# Patient Record
Sex: Male | Born: 1975 | Race: Black or African American | Hispanic: No | Marital: Married | Smoking: Former smoker
Health system: Southern US, Community
[De-identification: ages and names within clinical notes are randomized; demographics above are authoritative.]

## PROBLEM LIST (undated history)

## (undated) DIAGNOSIS — F32A Depression, unspecified: Secondary | ICD-10-CM

## (undated) DIAGNOSIS — R519 Headache, unspecified: Secondary | ICD-10-CM

## (undated) DIAGNOSIS — D72819 Decreased white blood cell count, unspecified: Secondary | ICD-10-CM

## (undated) DIAGNOSIS — IMO0001 Reserved for inherently not codable concepts without codable children: Secondary | ICD-10-CM

## (undated) DIAGNOSIS — Z5189 Encounter for other specified aftercare: Secondary | ICD-10-CM

## (undated) DIAGNOSIS — I10 Essential (primary) hypertension: Secondary | ICD-10-CM

## (undated) DIAGNOSIS — R58 Hemorrhage, not elsewhere classified: Secondary | ICD-10-CM

## (undated) DIAGNOSIS — D649 Anemia, unspecified: Secondary | ICD-10-CM

## (undated) DIAGNOSIS — E669 Obesity, unspecified: Secondary | ICD-10-CM

## (undated) DIAGNOSIS — K859 Acute pancreatitis without necrosis or infection, unspecified: Secondary | ICD-10-CM

## (undated) DIAGNOSIS — Z9181 History of falling: Secondary | ICD-10-CM

## (undated) DIAGNOSIS — E785 Hyperlipidemia, unspecified: Secondary | ICD-10-CM

## (undated) DIAGNOSIS — M1990 Unspecified osteoarthritis, unspecified site: Secondary | ICD-10-CM

## (undated) DIAGNOSIS — S88119A Complete traumatic amputation at level between knee and ankle, unspecified lower leg, initial encounter: Secondary | ICD-10-CM

## (undated) DIAGNOSIS — R531 Weakness: Secondary | ICD-10-CM

## (undated) DIAGNOSIS — F419 Anxiety disorder, unspecified: Secondary | ICD-10-CM

## (undated) DIAGNOSIS — F1020 Alcohol dependence, uncomplicated: Secondary | ICD-10-CM

## (undated) HISTORY — DX: Obesity, unspecified: E66.9

## (undated) HISTORY — DX: History of falling: Z91.81

## (undated) HISTORY — DX: Complete traumatic amputation at level between knee and ankle, unspecified lower leg, initial encounter: S88.119A

## (undated) HISTORY — DX: Hyperlipidemia, unspecified: E78.5

## (undated) HISTORY — DX: Decreased white blood cell count, unspecified: D72.819

## (undated) HISTORY — DX: Anxiety disorder, unspecified: F41.9

## (undated) HISTORY — DX: Alcohol dependence, uncomplicated: F10.20

## (undated) HISTORY — DX: Weakness: R53.1

## (undated) HISTORY — PX: EXPLORATORY LAPAROTOMY: SUR591

---

## 2010-07-31 ENCOUNTER — Emergency Department (HOSPITAL_COMMUNITY): Admission: EM | Admit: 2010-07-31 | Discharge: 2010-07-31 | Payer: Self-pay | Admitting: Family Medicine

## 2010-08-14 ENCOUNTER — Ambulatory Visit: Payer: Self-pay | Admitting: Internal Medicine

## 2010-08-14 ENCOUNTER — Inpatient Hospital Stay (HOSPITAL_COMMUNITY): Admission: EM | Admit: 2010-08-14 | Discharge: 2010-08-16 | Payer: Self-pay | Admitting: Emergency Medicine

## 2010-08-16 ENCOUNTER — Encounter: Payer: Self-pay | Admitting: Internal Medicine

## 2010-08-16 DIAGNOSIS — K859 Acute pancreatitis without necrosis or infection, unspecified: Secondary | ICD-10-CM

## 2010-08-16 DIAGNOSIS — E11621 Type 2 diabetes mellitus with foot ulcer: Secondary | ICD-10-CM

## 2010-08-16 DIAGNOSIS — E1110 Type 2 diabetes mellitus with ketoacidosis without coma: Secondary | ICD-10-CM

## 2010-08-16 DIAGNOSIS — L97509 Non-pressure chronic ulcer of other part of unspecified foot with unspecified severity: Secondary | ICD-10-CM

## 2010-08-16 DIAGNOSIS — E1142 Type 2 diabetes mellitus with diabetic polyneuropathy: Secondary | ICD-10-CM | POA: Insufficient documentation

## 2010-10-04 ENCOUNTER — Telehealth (INDEPENDENT_AMBULATORY_CARE_PROVIDER_SITE_OTHER): Payer: Self-pay | Admitting: *Deleted

## 2010-10-05 ENCOUNTER — Telehealth (INDEPENDENT_AMBULATORY_CARE_PROVIDER_SITE_OTHER): Payer: Self-pay | Admitting: *Deleted

## 2010-12-22 ENCOUNTER — Emergency Department (HOSPITAL_COMMUNITY)
Admission: EM | Admit: 2010-12-22 | Discharge: 2010-12-22 | Payer: Self-pay | Source: Home / Self Care | Admitting: Emergency Medicine

## 2010-12-22 ENCOUNTER — Telehealth: Payer: Self-pay | Admitting: *Deleted

## 2010-12-22 LAB — GLUCOSE, CAPILLARY: Glucose-Capillary: 376 mg/dL — ABNORMAL HIGH (ref 70–99)

## 2010-12-28 NOTE — Progress Notes (Signed)
Summary: PREVENTIVE COLONSCOPY  Phone Note Outgoing Call   Call placed by: Shon Hough,  October 04, 2010 2:42 PM Summary of Call: Patient is under the age of 63. New patient to the clinic no information found in EMR, and ECHART.  No access to a paper chart. No Colonoscopy info ava. Initial call taken by: Shon Hough,  October 04, 2010 2:43 PM

## 2010-12-28 NOTE — Progress Notes (Signed)
Summary: PREVENTIVE COLONOSCOPY  Phone Note Outgoing Call   Summary of Call: Patient under the age of 51.  New to the clinic no info ava on patient at this time in reagards to a colonoscopy. Initial call taken by: Shon Hough,  October 05, 2010 9:07 AM

## 2010-12-28 NOTE — Discharge Summary (Signed)
Summary: Hospital Discharge Update    Hospital Discharge Update:  Date of Admission: 08/14/2010 Date of Discharge: 08/16/2010  Brief Summary:  Pt was admitted and rx for the following: - DKA: precipitated by medication noncompliance and likely viral gastroenteritis. Admitted w/ BGs to the 500s. Stable after rx w/ insulin drip->subq, volume rescusitation and K repletion. Advised to take his insulin, was given a one month supply and asked to return to the clinic in the event he runs out and cant afford a refill. - Pancreatitis - from alcohol abuse. Not having a flare per no abd pain. Social work was consulted for counseling for his alcohol abuse given his rate of multiple admissions for pancreatitis flares precipitated by alcohol binge. - Financial issues - pt is currently unemployed and so unable to afford his meds. Discussed setting him up with our social worker in the clinic when he follows up for financial counseling.  Other follow-up issues:  Financial counseling/ needs an orange card  Problem list changes:  Added new problem of Hospitalized for  DKA (ICD-250.10) - Signed Added new problem of DIABETES MELLITUS, TYPE II (ICD-250.00) - Signed Added new problem of PANCREATITIS (ICD-577.0) - Signed  Medication list changes:  Added new medication of NOVOLOG MIX 70/30 70-30 % SUSP (INSULIN ASPART PROT & ASPART) inject 20units subq two times a day with meals - Signed Added new medication of NOVOLOG 100 UNIT/ML SOLN (INSULIN ASPART) inject 9units subq before breakfast, 12units before lunch and 9units before dinner - Signed  The medication, problem, and allergy lists have been updated.  Please see the dictated discharge summary for details.  Discharge medications:  NOVOLOG MIX 70/30 70-30 % SUSP (INSULIN ASPART PROT & ASPART) inject 20units subq two times a day with meals NOVOLOG 100 UNIT/ML SOLN (INSULIN ASPART) inject 9units subq before breakfast, 12units before lunch and 9units before  dinner  Other patient instructions:  Call the clinic if you run out of insulin.

## 2010-12-30 NOTE — Progress Notes (Signed)
Summary: WALKIN/ hla  Phone Note Other Incoming   Summary of Call: pt presented wanting to be seen and to get more insulin, pt has never been seen in clinic, appt was scheduled for 10/05/2010 and he was a noshow. spoke w/ dr Reche Dixon, no room on schedule, pt sent to ED and given info sheet from debrorah h., encouraged to be seen in ED and to come back to see [facility] for financial assist. pt states he is agreeable. Initial call taken by: Marin Roberts RN,  December 22, 2010 10:26 AM

## 2011-01-20 ENCOUNTER — Encounter: Payer: Self-pay | Admitting: Internal Medicine

## 2011-01-25 ENCOUNTER — Ambulatory Visit: Payer: Self-pay | Admitting: Internal Medicine

## 2011-02-10 LAB — COMPREHENSIVE METABOLIC PANEL
ALT: 25 U/L (ref 0–53)
Albumin: 2.7 g/dL — ABNORMAL LOW (ref 3.5–5.2)
Alkaline Phosphatase: 101 U/L (ref 39–117)
Alkaline Phosphatase: 147 U/L — ABNORMAL HIGH (ref 39–117)
BUN: 14 mg/dL (ref 6–23)
BUN: 6 mg/dL (ref 6–23)
Calcium: 9.4 mg/dL (ref 8.4–10.5)
Chloride: 108 mEq/L (ref 96–112)
Creatinine, Ser: 1.45 mg/dL (ref 0.4–1.5)
Glucose, Bld: 285 mg/dL — ABNORMAL HIGH (ref 70–99)
Glucose, Bld: 534 mg/dL — ABNORMAL HIGH (ref 70–99)
Potassium: 3.6 mEq/L (ref 3.5–5.1)
Sodium: 134 mEq/L — ABNORMAL LOW (ref 135–145)
Total Bilirubin: 0.8 mg/dL (ref 0.3–1.2)
Total Protein: 9.1 g/dL — ABNORMAL HIGH (ref 6.0–8.3)

## 2011-02-10 LAB — POCT I-STAT 3, VENOUS BLOOD GAS (G3P V)
Patient temperature: 97.4
pCO2, Ven: 33.6 mmHg — ABNORMAL LOW (ref 45.0–50.0)
pH, Ven: 7.019 — CL (ref 7.250–7.300)

## 2011-02-10 LAB — GLUCOSE, CAPILLARY
Glucose-Capillary: 123 mg/dL — ABNORMAL HIGH (ref 70–99)
Glucose-Capillary: 135 mg/dL — ABNORMAL HIGH (ref 70–99)
Glucose-Capillary: 192 mg/dL — ABNORMAL HIGH (ref 70–99)
Glucose-Capillary: 198 mg/dL — ABNORMAL HIGH (ref 70–99)
Glucose-Capillary: 209 mg/dL — ABNORMAL HIGH (ref 70–99)
Glucose-Capillary: 218 mg/dL — ABNORMAL HIGH (ref 70–99)
Glucose-Capillary: 263 mg/dL — ABNORMAL HIGH (ref 70–99)
Glucose-Capillary: 289 mg/dL — ABNORMAL HIGH (ref 70–99)
Glucose-Capillary: 373 mg/dL — ABNORMAL HIGH (ref 70–99)
Glucose-Capillary: 438 mg/dL — ABNORMAL HIGH (ref 70–99)
Glucose-Capillary: 564 mg/dL (ref 70–99)
Glucose-Capillary: 361 mg/dL — ABNORMAL HIGH (ref 70–99)

## 2011-02-10 LAB — BASIC METABOLIC PANEL
BUN: 10 mg/dL (ref 6–23)
BUN: 11 mg/dL (ref 6–23)
BUN: 12 mg/dL (ref 6–23)
BUN: 13 mg/dL (ref 6–23)
BUN: 5 mg/dL — ABNORMAL LOW (ref 6–23)
CO2: 14 mEq/L — ABNORMAL LOW (ref 19–32)
CO2: 16 mEq/L — ABNORMAL LOW (ref 19–32)
CO2: 16 mEq/L — ABNORMAL LOW (ref 19–32)
CO2: 19 mEq/L (ref 19–32)
CO2: 20 mEq/L (ref 19–32)
CO2: 21 mEq/L (ref 19–32)
CO2: 8 mEq/L — CL (ref 19–32)
Calcium: 9 mg/dL (ref 8.4–10.5)
Calcium: 9.1 mg/dL (ref 8.4–10.5)
Calcium: 9.4 mg/dL (ref 8.4–10.5)
Calcium: 9.6 mg/dL (ref 8.4–10.5)
Chloride: 108 mEq/L (ref 96–112)
Chloride: 108 mEq/L (ref 96–112)
Chloride: 109 mEq/L (ref 96–112)
Chloride: 110 mEq/L (ref 96–112)
Chloride: 111 mEq/L (ref 96–112)
Chloride: 112 mEq/L (ref 96–112)
Creatinine, Ser: 0.55 mg/dL (ref 0.4–1.5)
Creatinine, Ser: 0.65 mg/dL (ref 0.4–1.5)
Creatinine, Ser: 0.77 mg/dL (ref 0.4–1.5)
Creatinine, Ser: 0.93 mg/dL (ref 0.4–1.5)
Creatinine, Ser: 1.04 mg/dL (ref 0.4–1.5)
Creatinine, Ser: 1.12 mg/dL (ref 0.4–1.5)
Creatinine, Ser: 1.61 mg/dL — ABNORMAL HIGH (ref 0.4–1.5)
GFR calc Af Amer: 59 mL/min — ABNORMAL LOW (ref >60)
GFR calc Af Amer: >60 mL/min (ref >60)
GFR calc Af Amer: >60 mL/min (ref >60)
GFR calc Af Amer: >60 mL/min (ref >60)
GFR calc Af Amer: >60 mL/min (ref >60)
GFR calc Af Amer: >60 mL/min (ref >60)
GFR calc Af Amer: >60 mL/min (ref >60)
GFR calc Af Amer: >60 mL/min (ref >60)
GFR calc non Af Amer: 49 mL/min — ABNORMAL LOW (ref >60)
GFR calc non Af Amer: >60 mL/min (ref >60)
GFR calc non Af Amer: >60 mL/min (ref >60)
GFR calc non Af Amer: >60 mL/min (ref >60)
GFR calc non Af Amer: >60 mL/min (ref >60)
GFR calc non Af Amer: >60 mL/min (ref >60)
Glucose, Bld: 119 mg/dL — ABNORMAL HIGH (ref 70–99)
Glucose, Bld: 156 mg/dL — ABNORMAL HIGH (ref 70–99)
Glucose, Bld: 174 mg/dL — ABNORMAL HIGH (ref 70–99)
Glucose, Bld: 217 mg/dL — ABNORMAL HIGH (ref 70–99)
Glucose, Bld: 271 mg/dL — ABNORMAL HIGH (ref 70–99)
Glucose, Bld: 512 mg/dL — ABNORMAL HIGH (ref 70–99)
Potassium: 3.3 mEq/L — ABNORMAL LOW (ref 3.5–5.1)
Potassium: 3.4 mEq/L — ABNORMAL LOW (ref 3.5–5.1)
Potassium: 3.9 mEq/L (ref 3.5–5.1)
Potassium: 3.9 mEq/L (ref 3.5–5.1)
Potassium: 4.2 mEq/L (ref 3.5–5.1)
Potassium: 4.5 mEq/L (ref 3.5–5.1)
Potassium: 5.1 mEq/L (ref 3.5–5.1)
Potassium: 5.8 mEq/L — ABNORMAL HIGH (ref 3.5–5.1)
Sodium: 131 mEq/L — ABNORMAL LOW (ref 135–145)
Sodium: 133 mEq/L — ABNORMAL LOW (ref 135–145)
Sodium: 133 mEq/L — ABNORMAL LOW (ref 135–145)
Sodium: 134 mEq/L — ABNORMAL LOW (ref 135–145)
Sodium: 134 mEq/L — ABNORMAL LOW (ref 135–145)
Sodium: 138 mEq/L (ref 135–145)
Sodium: 139 mEq/L (ref 135–145)
Sodium: 139 mEq/L (ref 135–145)

## 2011-02-10 LAB — TSH: TSH: 1.99 u[IU]/mL (ref 0.350–4.500)

## 2011-02-10 LAB — URINALYSIS, ROUTINE W REFLEX MICROSCOPIC
Nitrite: NEGATIVE
Protein, ur: 100 mg/dL — AB
Specific Gravity, Urine: 1.039 — ABNORMAL HIGH (ref 1.005–1.030)
Urobilinogen, UA: 0.2 mg/dL (ref 0.0–1.0)

## 2011-02-10 LAB — CBC
HCT: 39 % (ref 39.0–52.0)
HCT: 44.8 % (ref 39.0–52.0)
MCV: 94.2 fL (ref 78.0–100.0)
MCV: 96.8 fL (ref 78.0–100.0)
RBC: 4.14 MIL/uL — ABNORMAL LOW (ref 4.22–5.81)
RDW: 13.3 % (ref 11.5–15.5)
WBC: 2.6 10*3/uL — ABNORMAL LOW (ref 4.0–10.5)
WBC: 5.5 10*3/uL (ref 4.0–10.5)

## 2011-02-10 LAB — LIPID PANEL
Cholesterol: 237 mg/dL — ABNORMAL HIGH (ref 0–200)
Triglycerides: 195 mg/dL — ABNORMAL HIGH (ref <150)

## 2011-02-10 LAB — HEMOGLOBIN A1C
Hgb A1c MFr Bld: 15 % — ABNORMAL HIGH (ref <5.7)
Mean Plasma Glucose: 384 mg/dL — ABNORMAL HIGH (ref <117)

## 2011-02-10 LAB — POCT CARDIAC MARKERS
Myoglobin, poc: 64.3 ng/mL (ref 12–200)
Troponin i, poc: <0.05 ng/mL (ref 0.00–0.09)

## 2011-04-04 ENCOUNTER — Inpatient Hospital Stay (HOSPITAL_COMMUNITY)
Admission: EM | Admit: 2011-04-04 | Discharge: 2011-04-13 | DRG: 637 | Disposition: A | Discharge status: Home / Self Care | Payer: Self-pay | Attending: Internal Medicine | Admitting: Internal Medicine

## 2011-04-04 DIAGNOSIS — K859 Acute pancreatitis without necrosis or infection, unspecified: Secondary | ICD-10-CM | POA: Diagnosis present

## 2011-04-04 DIAGNOSIS — E1010 Type 1 diabetes mellitus with ketoacidosis without coma: Principal | ICD-10-CM | POA: Diagnosis present

## 2011-04-04 DIAGNOSIS — K862 Cyst of pancreas: Secondary | ICD-10-CM

## 2011-04-04 DIAGNOSIS — E876 Hypokalemia: Secondary | ICD-10-CM | POA: Diagnosis not present

## 2011-04-04 DIAGNOSIS — D7589 Other specified diseases of blood and blood-forming organs: Secondary | ICD-10-CM | POA: Diagnosis present

## 2011-04-04 DIAGNOSIS — E871 Hypo-osmolality and hyponatremia: Secondary | ICD-10-CM | POA: Diagnosis present

## 2011-04-04 DIAGNOSIS — K863 Pseudocyst of pancreas: Secondary | ICD-10-CM

## 2011-04-04 DIAGNOSIS — K861 Other chronic pancreatitis: Secondary | ICD-10-CM | POA: Diagnosis present

## 2011-04-04 DIAGNOSIS — F1020 Alcohol dependence, uncomplicated: Secondary | ICD-10-CM | POA: Diagnosis present

## 2011-04-04 DIAGNOSIS — Z9119 Patient's noncompliance with other medical treatment and regimen: Secondary | ICD-10-CM

## 2011-04-04 DIAGNOSIS — R7881 Bacteremia: Secondary | ICD-10-CM | POA: Diagnosis not present

## 2011-04-04 DIAGNOSIS — B999 Unspecified infectious disease: Secondary | ICD-10-CM

## 2011-04-04 DIAGNOSIS — Z91199 Patient's noncompliance with other medical treatment and regimen due to unspecified reason: Secondary | ICD-10-CM

## 2011-04-04 DIAGNOSIS — B953 Streptococcus pneumoniae as the cause of diseases classified elsewhere: Secondary | ICD-10-CM | POA: Diagnosis not present

## 2011-04-04 DIAGNOSIS — R197 Diarrhea, unspecified: Secondary | ICD-10-CM | POA: Diagnosis not present

## 2011-04-04 DIAGNOSIS — I10 Essential (primary) hypertension: Secondary | ICD-10-CM | POA: Diagnosis present

## 2011-04-04 DIAGNOSIS — K029 Dental caries, unspecified: Secondary | ICD-10-CM | POA: Diagnosis present

## 2011-04-04 HISTORY — DX: Essential (primary) hypertension: I10

## 2011-04-04 LAB — DIFFERENTIAL
Basophils Absolute: 0 10*3/uL (ref 0.0–0.1)
Eosinophils Relative: 0 % (ref 0–5)
Lymphocytes Relative: 9 % — ABNORMAL LOW (ref 12–46)
Lymphs Abs: 0.6 10*3/uL — ABNORMAL LOW (ref 0.7–4.0)
Monocytes Absolute: 0.8 10*3/uL (ref 0.1–1.0)
Monocytes Relative: 12 % (ref 3–12)
Neutro Abs: 5.4 10*3/uL (ref 1.7–7.7)

## 2011-04-04 LAB — BASIC METABOLIC PANEL
BUN: 11 mg/dL (ref 6–23)
CO2: 9 mEq/L — CL (ref 19–32)
Calcium: 10 mg/dL (ref 8.4–10.5)
GFR calc non Af Amer: >60 mL/min (ref >60)
Glucose, Bld: 316 mg/dL — ABNORMAL HIGH (ref 70–99)
Sodium: 126 mEq/L — ABNORMAL LOW (ref 135–145)

## 2011-04-04 LAB — CBC
HCT: 45.5 % (ref 39.0–52.0)
Hemoglobin: 16.2 g/dL (ref 13.0–17.0)
MCH: 35.8 pg — ABNORMAL HIGH (ref 26.0–34.0)
MCHC: 35.6 g/dL (ref 30.0–36.0)
MCV: 100.7 fL — ABNORMAL HIGH (ref 78.0–100.0)
RDW: 12.2 % (ref 11.5–15.5)

## 2011-04-05 LAB — URINALYSIS, ROUTINE W REFLEX MICROSCOPIC
Bilirubin Urine: NEGATIVE
Ketones, ur: >80 mg/dL — AB
Specific Gravity, Urine: 1.034 — ABNORMAL HIGH (ref 1.005–1.030)
Urobilinogen, UA: 0.2 mg/dL (ref 0.0–1.0)

## 2011-04-05 LAB — LIPASE, BLOOD
Lipase: 37 U/L (ref 11–59)
Lipase: 33 U/L (ref 11–59)

## 2011-04-05 LAB — RAPID URINE DRUG SCREEN, HOSP PERFORMED
Cocaine: NOT DETECTED
Tetrahydrocannabinol: NOT DETECTED

## 2011-04-05 LAB — GLUCOSE, CAPILLARY
Glucose-Capillary: 267 mg/dL — ABNORMAL HIGH (ref 70–99)
Glucose-Capillary: 234 mg/dL — ABNORMAL HIGH (ref 70–99)
Glucose-Capillary: 119 mg/dL — ABNORMAL HIGH (ref 70–99)
Glucose-Capillary: 116 mg/dL — ABNORMAL HIGH (ref 70–99)
Glucose-Capillary: 177 mg/dL — ABNORMAL HIGH (ref 70–99)
Glucose-Capillary: 231 mg/dL — ABNORMAL HIGH (ref 70–99)
Glucose-Capillary: 221 mg/dL — ABNORMAL HIGH (ref 70–99)
Glucose-Capillary: 231 mg/dL — ABNORMAL HIGH (ref 70–99)
Glucose-Capillary: 198 mg/dL — ABNORMAL HIGH (ref 70–99)
Glucose-Capillary: 194 mg/dL — ABNORMAL HIGH (ref 70–99)

## 2011-04-05 LAB — BASIC METABOLIC PANEL
BUN: 9 mg/dL (ref 6–23)
CO2: 18 mEq/L — ABNORMAL LOW (ref 19–32)
Chloride: 98 mEq/L (ref 96–112)
Chloride: 101 mEq/L (ref 96–112)
Creatinine, Ser: 0.62 mg/dL (ref 0.4–1.5)
GFR calc Af Amer: >60 mL/min (ref >60)
Glucose, Bld: 223 mg/dL — ABNORMAL HIGH (ref 70–99)
Potassium: 3.7 mEq/L (ref 3.5–5.1)
Sodium: 132 mEq/L — ABNORMAL LOW (ref 135–145)

## 2011-04-05 LAB — RENAL FUNCTION PANEL
Albumin: 2.8 g/dL — ABNORMAL LOW (ref 3.5–5.2)
Calcium: 9.2 mg/dL (ref 8.4–10.5)
GFR calc Af Amer: >60 mL/min (ref >60)
Glucose, Bld: 171 mg/dL — ABNORMAL HIGH (ref 70–99)
Phosphorus: 2.2 mg/dL — ABNORMAL LOW (ref 2.3–4.6)
Sodium: 133 mEq/L — ABNORMAL LOW (ref 135–145)

## 2011-04-05 LAB — POCT I-STAT 3, ART BLOOD GAS (G3+)
TCO2: 8 mmol/L (ref 0–100)
pCO2 arterial: 17.2 mmHg — CL (ref 35.0–45.0)
pH, Arterial: 7.259 — ABNORMAL LOW (ref 7.350–7.450)
pO2, Arterial: 115 mmHg — ABNORMAL HIGH (ref 80.0–100.0)

## 2011-04-05 LAB — LIPID PANEL
HDL: 75 mg/dL (ref >39)
Total CHOL/HDL Ratio: 2 RATIO
VLDL: 12 mg/dL (ref 0–40)

## 2011-04-05 LAB — HEPATIC FUNCTION PANEL
Albumin: 3.8 g/dL (ref 3.5–5.2)
Indirect Bilirubin: 0.2 mg/dL — ABNORMAL LOW (ref 0.3–0.9)
Total Protein: 9.2 g/dL — ABNORMAL HIGH (ref 6.0–8.3)

## 2011-04-05 LAB — TSH: TSH: 1.192 u[IU]/mL (ref 0.350–4.500)

## 2011-04-05 LAB — URINE MICROSCOPIC-ADD ON

## 2011-04-05 LAB — MAGNESIUM: Magnesium: 1.9 mg/dL (ref 1.5–2.5)

## 2011-04-06 LAB — BASIC METABOLIC PANEL
CO2: 20 mEq/L (ref 19–32)
CO2: 20 mEq/L (ref 19–32)
Calcium: 9.2 mg/dL (ref 8.4–10.5)
Calcium: 9.5 mg/dL (ref 8.4–10.5)
Creatinine, Ser: 0.5 mg/dL (ref 0.4–1.5)
GFR calc Af Amer: >60 mL/min (ref >60)
GFR calc non Af Amer: >60 mL/min (ref >60)
Glucose, Bld: 219 mg/dL — ABNORMAL HIGH (ref 70–99)
Sodium: 131 mEq/L — ABNORMAL LOW (ref 135–145)

## 2011-04-06 LAB — GLUCOSE, CAPILLARY
Glucose-Capillary: 122 mg/dL — ABNORMAL HIGH (ref 70–99)
Glucose-Capillary: 145 mg/dL — ABNORMAL HIGH (ref 70–99)
Glucose-Capillary: 185 mg/dL — ABNORMAL HIGH (ref 70–99)
Glucose-Capillary: 222 mg/dL — ABNORMAL HIGH (ref 70–99)
Glucose-Capillary: 78 mg/dL (ref 70–99)

## 2011-04-06 LAB — CBC
HCT: 40.5 % (ref 39.0–52.0)
Hemoglobin: 14.5 g/dL (ref 13.0–17.0)
MCH: 34.8 pg — ABNORMAL HIGH (ref 26.0–34.0)
MCHC: 35.8 g/dL (ref 30.0–36.0)

## 2011-04-07 ENCOUNTER — Inpatient Hospital Stay (HOSPITAL_COMMUNITY): Payer: Self-pay

## 2011-04-07 LAB — URINALYSIS, ROUTINE W REFLEX MICROSCOPIC
Glucose, UA: NEGATIVE mg/dL
Leukocytes, UA: NEGATIVE
Specific Gravity, Urine: 1.021 (ref 1.005–1.030)
pH: 6 (ref 5.0–8.0)

## 2011-04-07 LAB — URINE MICROSCOPIC-ADD ON

## 2011-04-07 LAB — GLUCOSE, CAPILLARY: Glucose-Capillary: 67 mg/dL — ABNORMAL LOW (ref 70–99)

## 2011-04-08 ENCOUNTER — Encounter (HOSPITAL_COMMUNITY): Payer: Self-pay | Admitting: Radiology

## 2011-04-08 ENCOUNTER — Inpatient Hospital Stay (HOSPITAL_COMMUNITY): Payer: Self-pay

## 2011-04-08 DIAGNOSIS — R7881 Bacteremia: Secondary | ICD-10-CM

## 2011-04-08 LAB — GLUCOSE, CAPILLARY: Glucose-Capillary: 186 mg/dL — ABNORMAL HIGH (ref 70–99)

## 2011-04-08 LAB — CBC
Hemoglobin: 13.9 g/dL (ref 13.0–17.0)
MCH: 35 pg — ABNORMAL HIGH (ref 26.0–34.0)
MCHC: 36.1 g/dL — ABNORMAL HIGH (ref 30.0–36.0)
MCV: 97 fL (ref 78.0–100.0)
Platelets: 180 10*3/uL (ref 150–400)
RBC: 3.97 MIL/uL — ABNORMAL LOW (ref 4.22–5.81)

## 2011-04-08 LAB — DIFFERENTIAL
Basophils Relative: 0 % (ref 0–1)
Eosinophils Absolute: 0 10*3/uL (ref 0.0–0.7)
Lymphs Abs: 0.5 10*3/uL — ABNORMAL LOW (ref 0.7–4.0)
Monocytes Absolute: 1.8 10*3/uL — ABNORMAL HIGH (ref 0.1–1.0)
Monocytes Relative: 26 % — ABNORMAL HIGH (ref 3–12)
Neutrophils Relative %: 66 % (ref 43–77)

## 2011-04-08 LAB — URINE CULTURE
Culture  Setup Time: 201205101800
Special Requests: NEGATIVE

## 2011-04-08 LAB — BASIC METABOLIC PANEL
BUN: 8 mg/dL (ref 6–23)
CO2: 26 mEq/L (ref 19–32)
Chloride: 92 mEq/L — ABNORMAL LOW (ref 96–112)
Glucose, Bld: 182 mg/dL — ABNORMAL HIGH (ref 70–99)
Potassium: 2.7 mEq/L — CL (ref 3.5–5.1)
Sodium: 131 mEq/L — ABNORMAL LOW (ref 135–145)

## 2011-04-08 LAB — HEPATITIS PANEL, ACUTE
Hep A IgM: NEGATIVE
Hepatitis B Surface Ag: NEGATIVE

## 2011-04-08 MED ORDER — IOHEXOL 300 MG/ML  SOLN: 100.0000 mL | Freq: Once | INTRAMUSCULAR | Status: AC | PRN

## 2011-04-08 NOTE — Group Therapy Note (Signed)
NAMEBRENDON, [name]                 ACCOUNT NO.:  [id number]  MEDICAL RECORD NO.:  [id number]           PATIENT TYPE:  I  LOCATION:  3009                         FACILITY:  [facility]  PHYSICIAN:  Hollice Espy, M.D.DATE OF BIRTH:  27-Aug-1976                                PROGRESS NOTE   PRIMARY CARE PHYSICIAN: Gentry Fitz, has previously been seen at [facility].  CHIEF COMPLAINT/REASON FOR ADMISSION: Mr. [name] is a 35 year old male patient with known type 1 diabetes suspected related to pancreatic dysfunction from alcohol abuse.  He does have a prior history of pancreatitis.  He apparently started experiencing right upper quadrant abdominal pain, which is why he actually presented to the hospital. He felt that he was having pancreatitis.  He apparently drank about 2-3 days prior to presenting to the ER, but was beginning to get sick with nausea and abdominal pain. Because he has no insurance, no job, and no primary care physician, he was unable to buy his insulin for the past 3 days.  He denied to the admitting physician issues related to actual vomiting, diarrhea, hematemesis, melena, or bright red blood per rectum or other GI problems than previously stated.  He rates as abdominal pain as being 8/10 in the right upper quadrant which radiates to the back.  It is aggravated by movement or meals.  Nothing relieved it at home, so this is why he presented to the ER.  OBJECTIVE: On clinical exam in the ER, the patient was afebrile.  Temperature 97.4, BP 122/80, pulse 81, respirations 20, and O2 sat 99% on room air.  The physical exam was otherwise unremarkable.  LABORATORY DATA: In the ER, his lipase was 37.  Initial glucose was 304.  A pH of 7.259, pCO2 of 17.2, pO2 of 115, and bicarb 7.7.  Sodium 126, potassium 4.5, chloride 88, CO2 of 9, glucose 316 on electrolyte panel, BUN 11, creatinine 0.81, calcium 10.  White count 6900, hemoglobin 16.2, MCV 100.7, platelets 229,000, and  reported mild neutrophilia.  Urinalysis was very concentrated with a specific gravity of 1.034, glucose urea of greater than 1000, trace hemoglobin ketones more than 80, urine microscopy showed wbc's 3-6 with rare bacteria.  LFTs basically were within normal limits except an elevated alkaline phosphatase of 138.  PAST MEDICAL HISTORY: 1. Type 1 diabetes, insulin dependent. 2. Pancreatitis. 3. Hypertension. 4. Alcohol dependence.  HOSPITAL ADMITTING DIAGNOSES: 1. Acute diabetic ketoacidosis with an anion gap of 29. 2. Profound dehydration with metabolic acidosis related to diabetic     ketoacidosis. 3. Alcohol dependence, actual daily alcohol intake unknown. 4. Pseudohyponatremia secondary to hyperglycemia. 5. Abdominal pain, unclear if related to diabetic ketoacidosis versus     other intra-abdominal process.  DIAGNOSTICS: 1. Two-view chest x-ray on Apr 07, 2011, shows no acute or active     process identified with stable appearance of the chest. 2. Orthopantogram from Apr 08, 2011, pending. 3. A CT of the abdomen with contrast on Apr 08, 2011, that shows acute     on chronic pancreatitis.  There is a 6.1 cm lobulated fluid     collection along  the tail of the pancreas and is favored to     represent a pseudocyst, although cannot exclude superimposed     infection.  Comparison to prior outside imaging if available would     be helpful to evaluate for interval change.  He has diffuse hepatic     steatosis.  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  He has dilated jejunal     loops likely reactive to the nearby inflammatory process,     Radiologist notes this to be a sentinel loop sign.  If there is     concern for small bowel obstruction, consider a followup KUB in 6     hours to document passage of enteric-contrast into the colon.  LABORATORY DATA: Lipid profile; cholesterol 150, triglycerides 61, HDL cholesterol  75, and LDL 63.  Repeat lipase on Apr 05, 2011, is 56.  Urine drug screen is negative.  TSH 1.192, hemoglobin A1c is 14.8.  Repeat urinalysis on Apr 07, 2011, shows clear appearance, small amount of bilirubin, 40 ketones, moderate blood, 100 of protein.  Microscopy shows 0-2 wbc's, 3-6 rbc's, and abundant mucus.  Blood cultures Apr 07, 2011, x2 sets one of two bottles is positive for gram positive cocci in pairs and chains.  An acute hepatitis panel from May 10, 201, shows hepatitis B surface antigen negative, but hepatitis C antibody be active, additional screening antibodies and antigens are pending.  An HCV qualitative PCR has been ordered in response to the positive hepatitis C antibody. HIV is also pending.  HOSPITAL COURSE: 1. DKA.  The patient was admitted to the ICU because of profound     dehydration, minimal hypotension and severity of his acidosis     with an anion gap at 29.  He was started on IV insulin infusion and     was gradually transitioned over to subcutaneous insulin.  Because     of no insurance and lack of access to healthcare, it was opted to     discontinue Lantus and place him on mixed insulin 70/30.  Diabetic     educator has been consulted to assist with teaching and other     needs.  Hemoglobin A1c 14.8 on this admission.  The patient did     endorse that he had been attempting to minimize his insulin use and     had only been without insulin about 2-3 days this episode.  He     endorses the last time he saw a physician was when he was     hospitalized in September 2011.  Because of social issues, case     management and social worker have been assisting with this patient     and process is in place including forms to be completed or in the     process of being completed to allow the patient access to insulin     and he has been given instructions on how to obtain lower cost     glucometer machine and supplies at [employer].  He has also been set     up with  [facility] for after discharge with an eligibility     appointment and then a physician appointment to follow.  As of now,     his CBG is relatively well-controlled, they have been between 106     and 186 over the past 24 hours.  He did decrease yesterday evening  around 10:30 p.m. to 67, so I have decreased his a.m. 70/30 from 20     units to 16 units as of today. 2. Pseudohyponatremia.  This has been solely related to the patient's     hyperglycemia.  A last sodium was checked on Apr 06, 2011, and was     130 with a glucose of 219.  A repeat B-MET is pending for Apr 09, 2011. 3. Fever, diarrhea, and bacteremia.  The patient was initially planned     on attempting to discharge home on Apr 07, 2011, but it was noted     during the night he has spiked a temperature of up to 102.9.  He     had an additional spike on Apr 07, 2011, at 2 p.m. of 102.9.     Because of these changes, workup was initiated, a repeat urinalysis     was negative.  Portable chest x-ray was negative.  Blood cultures     were obtained and one of two bottles were positive for gram     positive cocci in chains, this is concerning for possible strep.     In talking with the patient, he also endorses having diarrhea for     at least 1 month prior to admission and has had persistent diarrhea     since arrival.  At this point,the diarrhea could be exacerbated by     the Reglan we had placed him on for presumed diabetic     gastroparesis issues, so the Reglan has been discontinued.  Stool     studies have been initiated including C diff PCR, fecal lactoferrin,     and stool cultures.  Interestingly enough, he does not have any     leukocytosis.  Because of the bacteremia, we have consulted ID, Dr.     Maurice March will see the patient, but he recommends beginning empiric     Rocephin and vancomycin which we will start today.  The patient has     no clinical signs of endocarditis.  In further talking with the     patient, he  has had prior dental abscess 1 year ago and he does have poor     dentition so an orthopantogram is pending.  Because of his history of     alcoholism and prior pancreatitis despite a negative clinical     abdominal exam, a CT of the abdomen and pelvis was obtained today.     This reveals a lobulated peripherally enhancing fluid collection     measuring 6.1 x 3.4 cm along the superior margin of the tail of the     pancreas.  This is also inseparable from the inferior wall of the     stomach.  Internal contents measure water attenuation and there are     no internal foci of air.  It is also noted that the main pancreatic     duct is dilated to 9 mm within the body, not perceptible between     the head of the pancreas.  There is mild ill-defined fluid and soft     tissue stranding within the retropancreatic fat posterior to the     tail and extending inferior along the left anterior pararenal space     and there are numerous prominent gastrohepatic ligament and     retroperitoneal lymph nodes through the subcentimeter short axis,     which may be reactive.  Associated with this fluid  collection or     dilated jejunal loops, likely reactive to the nearby inflammatory     process.  The patient is not experiencing any signs of bowel     obstruction, but is endorsing diarrhea process.  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 4. Alcoholism.  Per my conversation with the patient this admission,     he endorsed that he drinks about 5-6, 40 ounce beers daily and     occasionally uses marijuana, denies any other illegal drugs.  I did     discuss with him need for alcohol cessation especially in the     setting of diabetes.  He will be given resources for outpatient     referral to agencies such as AA etc., which he can follow up at his     discretion after discharge.  DISPOSITION: At the present time due to the  bacteremia, probable acute on chronic pancreatitis and fevers, the patient is not appropriate for discharge home.  He will remain on the non telemetry floor and we will transfer him over to a new triad hospitalist team beginning Apr 09, 2011.     Allison L. Rennis Harding, N.P.   ______________________________ Hollice Espy, M.D.    ALE/MEDQ  D:  04/08/2011  T:  04/08/2011  Job:  [id number]  Electronically Signed by Junious Silk N.P. on 04/08/2011 04:05:58 PM Electronically Signed by Virginia Rochester M.D. on 04/08/2011 06:20:31 PM

## 2011-04-09 DIAGNOSIS — R7881 Bacteremia: Secondary | ICD-10-CM

## 2011-04-09 LAB — GLUCOSE, CAPILLARY
Glucose-Capillary: 91 mg/dL (ref 70–99)
Glucose-Capillary: 230 mg/dL — ABNORMAL HIGH (ref 70–99)
Glucose-Capillary: 270 mg/dL — ABNORMAL HIGH (ref 70–99)
Glucose-Capillary: 118 mg/dL — ABNORMAL HIGH (ref 70–99)

## 2011-04-09 LAB — CBC
MCH: 35 pg — ABNORMAL HIGH (ref 26.0–34.0)
MCV: 96.3 fL (ref 78.0–100.0)
Platelets: 233 10*3/uL (ref 150–400)
RDW: 11.9 % (ref 11.5–15.5)
WBC: 6.3 10*3/uL (ref 4.0–10.5)

## 2011-04-09 LAB — BASIC METABOLIC PANEL
BUN: 5 mg/dL — ABNORMAL LOW (ref 6–23)
CO2: 21 mEq/L (ref 19–32)
Calcium: 9.8 mg/dL (ref 8.4–10.5)
Chloride: 99 mEq/L (ref 96–112)
Creatinine, Ser: 0.62 mg/dL (ref 0.4–1.5)

## 2011-04-09 LAB — COMPREHENSIVE METABOLIC PANEL
Albumin: 2.5 g/dL — ABNORMAL LOW (ref 3.5–5.2)
BUN: 6 mg/dL (ref 6–23)
Calcium: 9.4 mg/dL (ref 8.4–10.5)
Creatinine, Ser: 0.56 mg/dL (ref 0.4–1.5)
GFR calc Af Amer: >60 mL/min (ref >60)
Total Protein: 7.4 g/dL (ref 6.0–8.3)

## 2011-04-09 LAB — PHOSPHORUS: Phosphorus: 3.2 mg/dL (ref 2.3–4.6)

## 2011-04-09 LAB — HIV ANTIBODY (ROUTINE TESTING W REFLEX): HIV: NONREACTIVE

## 2011-04-09 LAB — CLOSTRIDIUM DIFFICILE BY PCR: Toxigenic C. Difficile by PCR: NEGATIVE

## 2011-04-09 LAB — FECAL LACTOFERRIN, QUANT

## 2011-04-10 LAB — BASIC METABOLIC PANEL
CO2: 22 mEq/L (ref 19–32)
Calcium: 9.5 mg/dL (ref 8.4–10.5)
Glucose, Bld: 249 mg/dL — ABNORMAL HIGH (ref 70–99)
Sodium: 130 mEq/L — ABNORMAL LOW (ref 135–145)

## 2011-04-10 LAB — CULTURE, BLOOD (ROUTINE X 2)

## 2011-04-10 LAB — CBC
HCT: 40.5 % (ref 39.0–52.0)
Hemoglobin: 14.5 g/dL (ref 13.0–17.0)
MCH: 34.6 pg — ABNORMAL HIGH (ref 26.0–34.0)
MCHC: 35.8 g/dL (ref 30.0–36.0)
MCV: 96.7 fL (ref 78.0–100.0)

## 2011-04-10 LAB — GLUCOSE, CAPILLARY: Glucose-Capillary: 240 mg/dL — ABNORMAL HIGH (ref 70–99)

## 2011-04-11 LAB — GLUCOSE, CAPILLARY
Glucose-Capillary: 258 mg/dL — ABNORMAL HIGH (ref 70–99)
Glucose-Capillary: 318 mg/dL — ABNORMAL HIGH (ref 70–99)

## 2011-04-12 DIAGNOSIS — I339 Acute and subacute endocarditis, unspecified: Secondary | ICD-10-CM

## 2011-04-12 LAB — PROTIME-INR: INR: 1.18 (ref 0.00–1.49)

## 2011-04-12 LAB — GLUCOSE, CAPILLARY: Glucose-Capillary: 387 mg/dL — ABNORMAL HIGH (ref 70–99)

## 2011-04-12 LAB — STOOL CULTURE

## 2011-04-12 LAB — BASIC METABOLIC PANEL
CO2: 29 mEq/L (ref 19–32)
Chloride: 101 mEq/L (ref 96–112)
Creatinine, Ser: 0.58 mg/dL (ref 0.4–1.5)
GFR calc Af Amer: >60 mL/min (ref >60)
Potassium: 4 mEq/L (ref 3.5–5.1)
Sodium: 136 mEq/L (ref 135–145)

## 2011-04-12 LAB — CBC
HCT: 35.1 % — ABNORMAL LOW (ref 39.0–52.0)
Hemoglobin: 12.3 g/dL — ABNORMAL LOW (ref 13.0–17.0)
MCH: 34.3 pg — ABNORMAL HIGH (ref 26.0–34.0)
MCHC: 35 g/dL (ref 30.0–36.0)
MCV: 97.8 fL (ref 78.0–100.0)
RDW: 12 % (ref 11.5–15.5)
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
## 2011-04-13 LAB — CBC
MCH: 34.4 pg — ABNORMAL HIGH (ref 26.0–34.0)
Platelets: 324 10*3/uL (ref 150–400)
RBC: 3.89 MIL/uL — ABNORMAL LOW (ref 4.22–5.81)

## 2011-04-13 LAB — GLUCOSE, CAPILLARY
Glucose-Capillary: 240 mg/dL — ABNORMAL HIGH (ref 70–99)
Glucose-Capillary: 216 mg/dL — ABNORMAL HIGH (ref 70–99)
Glucose-Capillary: 356 mg/dL — ABNORMAL HIGH (ref 70–99)

## 2011-04-13 LAB — BASIC METABOLIC PANEL
Calcium: 9.8 mg/dL (ref 8.4–10.5)
Chloride: 95 mEq/L — ABNORMAL LOW (ref 96–112)
Creatinine, Ser: 0.61 mg/dL (ref 0.4–1.5)
GFR calc Af Amer: >60 mL/min (ref >60)
Sodium: 133 mEq/L — ABNORMAL LOW (ref 135–145)

## 2011-04-13 LAB — HEPATITIS C VRS RNA DETECT BY PCR-QUAL: Hepatitis C Vrs RNA by PCR-Qual: NEGATIVE

## 2011-04-13 LAB — CULTURE, BLOOD (ROUTINE X 2)
Culture  Setup Time: 201205102048
Culture: NO GROWTH

## 2011-04-14 LAB — CULTURE, BLOOD (ROUTINE X 2)
Culture  Setup Time: 201205112343
Culture  Setup Time: 201205112343

## 2011-04-28 NOTE — H&P (Signed)
[name], [name]                 ACCOUNT NO.:  [id number]  MEDICAL RECORD NO.:  [id number]           PATIENT TYPE:  E  LOCATION:  [facility]                         FACILITY:  [facility]  PHYSICIAN:  Lonia Blood, M.D.      DATE OF BIRTH:  09/29/1976  DATE OF ADMISSION:  04/04/2011 DATE OF DISCHARGE:                             HISTORY & PHYSICAL   PRIMARY CARE PHYSICIAN:  He is unassigned.  PRESENTING COMPLAINT:  Weakness, nausea, vomiting, and abdominal pain.  HISTORY OF PRESENT ILLNESS:  The patient is a 35 year old gentleman with known history of type 1 diabetes who apparently has been out of his medication for at least 3 days.  The patient drinks daily alcohol.  He has had prior history of pancreatitis.  He started experiencing right upper quadrant abdominal pain which is one of the reasons why he came in thinking that he has another bout of pancreatitis.  He last drank alcohol about 2-3 days ago when he started getting sick.  He has no primary care physician, but more importantly he has no insurance, so he is unable to buy his insulin, hence he is out.  He had some nausea, but no actual vomiting, no diarrhea, no hematemesis, no melena, no bright red blood per rectum.  His abdominal pain is rated as 8/10 in the right upper quadrant radiating to the back.  It is aggravated by movement or meals.  Nothing relieves it at home that is why he came to the emergency room.  PAST MEDICAL HISTORY:  Significant for: 1. Type 1 diabetes. 2. Hypertension. 3. Pancreatitis. 4. Alcohol dependence.  ALLERGIES:  He has no known drug allergies.  MEDICATIONS:  NovoLog subcutaneously 9 units in supper and 12 units at night.  The patient is, however, out of medication at the moment.  SOCIAL HISTORY:  The patient lives at home with family.  He drinks at least 2 units almost every night.  Denied tobacco use.  He occasionally uses cannabis.  PAST SURGICAL HISTORY:  Status post exploratory laparotomy  in the past.  FAMILY HISTORY:  Significant for diabetes.  REVIEW OF SYSTEMS:  Mainly weakness, otherwise all systems reviewed are negative except per HPI.  PHYSICAL EXAMINATION:  VITAL SIGNS:  Temperature is 97.4, blood pressure 123/80, pulse 81, respiratory rate 20, and his sats 99% on room air. GENERAL:  He is awake, alert, oriented, looks acutely ill.  He is in no acute distress. HEENT:  PERRL.  EOMI.  No pallor, no jaundice, no rhinorrhea.  He has dry mucous membrane. NECK:  Supple.  No visible JVD, no lymphadenopathy. RESPIRATORY:  He has good air entry bilaterally.  No wheezes, no rales, no crackles. CARDIOVASCULAR:  S1 and S2.  No audible murmur. ABDOMEN:  Soft, full, mild diffuse discomfort, but no frank tenderness. Positive bowel sounds. EXTREMITIES:  No edema, cyanosis, or clubbing. SKIN:  No rashes or ulcers.  LABORATORY DATA:  His lipase is 37.  Initial glucose was 304.  PH is 7.259, pCO2 of 17.2 with pO2 of 115, bicarb is 7.7, sats 98% on room air.  Sodium 126, potassium 4.5, chloride  88, CO2 of 9, glucose 316, BUN 11, creatinine 0.81, calcium 10.0.  White count is 6.9, hemoglobin 16.2 with an MCV of 100.7.  Platelet is 229.  Mild neutrophilia.  Urinalysis showed concentrated urine with specific gravity 1.034, glucosuria more than 1000, trace hemoglobin, ketones more than 80.  Urine microscopy, wbc 3-6 with rare bacteria.  His LFTs showed a total protein 9.2, albumin 3.8, alkaline phosphatase 138, the rest of the LFTs within normal.  ASSESSMENT:  This is a 35 year old gentleman presenting with what appears to be diabetic ketoacidosis, more than likely secondary to losing his medications.  He has not been on insulin for at least 3 days which may account for these.  PLAN:  Therefore, 1. diabetic ketoacidosis.  We will admit the patient to Step-Down     Unit.  Start him on IV insulin protocol for diabetic ketoacidosis.     Titrate it accordingly.  Once the patient's  gap closes which is     currently at 34, we will revert him to subcutaneous insulin and see     what we can do to help him get it as an outpatient. 2. Dehydration from his diabetic ketoacidosis.  We will give him fluid     boluses, then continue with high-dose fluids. 3. Alcohol dependence.  I will put him some thiamine, folic acid and     start CIWA protocol if there is any sign of withdrawals. 4. Pseudohyponatremia from his high blood sugar. 5. Abdominal pain.  Probably, this is due to his DKA.  No evidence of     pancreatitis at this point.     Lonia Blood, M.D.     Verlin Grills  D:  04/05/2011  T:  04/05/2011  Job:  [id number]  Electronically Signed by Lonia Blood M.D. on 04/28/2011 11:46:15 AM

## 2011-05-18 ENCOUNTER — Ambulatory Visit (HOSPITAL_COMMUNITY): Payer: Self-pay

## 2011-06-24 NOTE — Discharge Summary (Signed)
[name], BARGE                 ACCOUNT NO.:  [id number]  MEDICAL RECORD NO.:  [id number]           PATIENT TYPE:  I  LOCATION:  3009                         FACILITY:  [facility]  PHYSICIAN:  Baltazar Najjar, MD     DATE OF BIRTH:  [birth year]  DATE OF ADMISSION:  04/04/2011 DATE OF DISCHARGE:  04/13/2011                              DISCHARGE SUMMARY   DISCHARGE DIAGNOSES: 1. Diabetic ketoacidosis, resolved. 2. 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 3. Fever with positive blood cultures, Streptococcus pneumoniae     bacteremia. 4. Pancreatic pseudocyst. 5. Hepatitis B antibody positive. 6. Heavy alcohol abuse.  CONDITION ON DISCHARGE:  The patient is alert and oriented, feeling well.  He is eating, he appears energetic and ready to go home.  HISTORY AND BRIEF HOSPITAL COURSE:  Mr. [name] is a 35 year old gentleman with a history of type 1 diabetes who apparently without of his insulin for 3 days prior to coming to the emergency department.  He reported that he drank alcohol heavy and had a prior history of pancreatitis. When he came to the ED, he was having right upper quadrant abdominal pain and nausea, vomiting.  He was found to be severely dehydrated and DKA with an anion gap of 29.  He was admitted to the Step-down Unit and started on IV insulin per protocol for diabetic acidosis.  Also, given his alcohol dependence, he was put on thiamine, folic acid and started on CIWA protocol.  Over the course of the next couple days, he developed a fever of 102, blood cultures were drawn and he was found to have 1 out of 3 blood cultures that was positive for Streptococcus pneumoniae. Infectious Disease was called and they recommended 2 weeks of treatment with IV Rocephin.  There was some discussion about whether or not the positive blood culture was simply a contaminant.  Prior to discharge, the patient was changed from IV Rocephin to p.o. amoxicillin, which will be on until  Apr 22, 2011.  The patient's fevers resolved and he has had no more signs of infection.  During his fever workup, he had a CT of his abdomen and pelvis.  They revealed a lobulated peripheral enhancing fluid collection measuring 6.1 x 3.4 cm long, felt to be a pancreatic pseudocyst.  The patient does have a history of chronic pancreatitis.  [facility] was called and saw the patient in consultation and they felt that it was not infected that there was no need for drainage of the cyst, no need for surgical intervention.  They did recommend that he have a follow up CT scan in 6 weeks to ensure resolution of the pancreatic pseudocyst.  Hepatitis.  Acute hepatitis panel was drawn, hepatitis A and hepatitis B were negative, however, his hepatitis C antibody was positive.  He has a hepatitis RNA pending that will need to be followed after discharge.  With regards to his diabetes, the patient received inpatient diabetic education and understands that insulin is essential for his survival. He is then placed on a regimen of 70/30 twice a day, 24 units in the morning  with breakfast, 14 units in the evening with dinner.  He will be given insulin at discharge that should be quantity sufficient to last him until he is seen by Dr. Audria Nine at [facility] on May 26, 2011. He has been told that he can by an extensive strips and glucometer at [facility].  CBGs in the hospital had average to approximately 200.  His hemoglobin A1c was 14.8.  The patient reported a history of heavy alcohol use.  He said it was so heavy that he was unable to quantify it.  He has been counseled multiple times during this hospitalization about his need to stop using alcohol and he commits that he has stopped drinking.  He understands that if effects his diabetes, his pancreas and his liver, and this is a central to his long-term term survival, not to drink alcohol anymore.  He was offered resources and  assistance from social work, he refused these and adamantly insists that he is going to stop drinking alcohol.  PHYSICAL EXAMINATION:  GENERAL:  Today at the day of discharge, again, the patient is alert and oriented, sitting up on the side of the bed, looks energetic and ready to go. VITAL SIGNS:  Temperature 98.0, pulse 62, respirations 20, blood pressure 125/84, O2 sats 96% on room air. HEENT:  Head is atraumatic, normocephalic.  Eyes are anicteric with pupils are equal, round, reactive to light.  Nose shows no nasal discharge or exterior lesions.  Mouth has moist membranes with good dentition. NECK:  Supple with midline trachea.  No JVD.  No lymphadenopathy. CHEST:  Demonstrates no accessory muscle use.  He has no wheezes or crackles to my auscultation. HEART:  Has a regular rate and rhythm without obvious murmurs, rubs or gallops. ABDOMEN:  Soft, nontender, nondistended with good bowel sounds. EXTREMITIES:  Show no clubbing, cyanosis or edema.  He has 5/5 strength in each extremity. SKIN:  Shows a rash along his shoulders and back that I believe it may be from oral narcotics that he has received during this hospitalization. It does not appear infected and he has been advised to take over-the- counter Benadryl for it.  I anticipate that it will disappear when he is off of oral narcotics.  CONSULTATIONS:  The patient was seen in consultation by [facility] for his pancreatic pseudocyst.  He also received consultation from Infectious Disease because of his positive blood culture.  We will receive recommendations for antibiotic therapy.  IMAGING:  On May 10,2012, the patient had a two-view chest x-ray that showed no acute or active process identified.  On Apr 08, 2011, he had a CT abdomen and pelvis that showed chronic pancreatitis, also a 6-cm lobulated fluid collection along the tail of the pancreas.  They would present pseudocyst, also diffuse hepatic steatosis,  also splenic vein thrombosis with numerous collaterals, therefore, likely chronic. Finally, the patient had on Apr 08, 2011,  orthopantogram x-ray in search for possible bone abscess looking for the etiology of his fever. Impression showed dental caries involving the second maxillary molars bilaterally and possible periapical lucency on the left.  MICROBIOLOGY:  The patient has stool culture on Apr 09, 2011, was negative.  Blood cultures on Apr 07, 2011, showed Streptococcus pneumoniae sensitive to ceftriaxone, levofloxacin and penicillin.  Blood cultures on Apr 08, 2011, preliminarily show no growth.  He had an urine culture on Apr 07, 2011, final, no growth.  The second blood culture drawn on Apr 07, 2011, shows no growth to date,  still in preliminary status.  PROCEDURES:  On Apr 12, 2011, the patient had a transesophageal echo looking for possible endocarditis.  This study was found to be negative. There was no endocarditis.  His estimated ejection fraction was 55%. Wall motion was normal.  There were no regional wall motion abnormalities.  The rest of the echocardiogram was normal.  However, he had a borderline dilated aortic root at 3.8 cm, otherwise normal caliber.  PERTINENT LABS:  On admission, the patient had a pH of 7.259, pCO2 17.2, bicarb 7.7, hemoglobin 16.2, hematocrit 45.5, white count 6.9, platelets 229.  His CBG at that time was 309.  Sodium 126, potassium 4.5, chloride 88, bicarb 9, glucose 316, total bilirubin 0.1, alkaline phosphatase 138, AST 30, ALT 43, total protein 9.2, serum albumin 3.8.  Hemoglobin A1c 14.8.  He had greater than 1000 glucose in his urine.  He was tested for HIV found to be nonreactive.  Hepatitis B surface antigen negative, hepatitis B core antibody IgM negative, hepatitis A antibody IgM negative, hepatitis B antibody positive.  He had an episode of hypokalemia with low of 2.7.  Today, at the day of discharge, labs were as follows:  hemoglobin 13.4, white count 3.7, hematocrit 38.4, platelets 324.  Glucose 216, sodium 133, potassium 4.1, chloride 95, bicarb 27, glucose 235, BUN 6, creatinine 0.61.  DISCHARGE MEDICATIONS.: 1. Amoxicillin 500 mg 2 tablets by mouth twice daily take for 9 more     days for positive blood culture. 2. Multivitamin 1 capsule by mouth daily. 3. NovoLog Mix 70/30, 24 units with breakfast, 14 units with dinner.     The patient will be given a prescription for 45 days worth that     should be ampule supply to taken through his [facility]     appointment with Dr. Audria Nine. 4. Next prescriptions Percocet 5/325 take 1-2 pills by mouth twice     daily as needed.  He received quantity sufficient for 10 days with     no refills. 5. Aspirin 81 mg 1 tablet by mouth daily.  PENDING STUDIES:  The patient has 2 preliminary blood cultures that should be finalized in the next couple of days.  He has an outstanding hepatitis C RNA.  He is due for a follow up CT scan in 6 weeks for his pancreatic pseudocyst.  He also has multiple dental caries that likely need attention from his orthopantogram.     Stephani Police, PA   ______________________________ Baltazar Najjar, MD    MLY/MEDQ  D:  04/13/2011  T:  04/13/2011  Job:  [id number]  cc:   Maurice March, M.D.  Electronically Signed by Algis Downs PA on 04/18/2011 04:19:55 PM Electronically Signed by Hannah Beat MD on 06/24/2011 02:57:05 PM

## 2011-10-12 ENCOUNTER — Emergency Department (HOSPITAL_COMMUNITY)
Admission: EM | Admit: 2011-10-12 | Discharge: 2011-10-12 | Disposition: A | Discharge status: Home / Self Care | Payer: Self-pay | Attending: Emergency Medicine | Admitting: Emergency Medicine

## 2011-10-12 ENCOUNTER — Other Ambulatory Visit: Payer: Self-pay

## 2011-10-12 ENCOUNTER — Emergency Department (HOSPITAL_COMMUNITY): Payer: Self-pay

## 2011-10-12 ENCOUNTER — Encounter (HOSPITAL_COMMUNITY): Payer: Self-pay | Admitting: *Deleted

## 2011-10-12 DIAGNOSIS — D72819 Decreased white blood cell count, unspecified: Secondary | ICD-10-CM | POA: Insufficient documentation

## 2011-10-12 DIAGNOSIS — M79609 Pain in unspecified limb: Secondary | ICD-10-CM | POA: Insufficient documentation

## 2011-10-12 DIAGNOSIS — R631 Polydipsia: Secondary | ICD-10-CM | POA: Insufficient documentation

## 2011-10-12 DIAGNOSIS — Z9114 Patient's other noncompliance with medication regimen: Secondary | ICD-10-CM

## 2011-10-12 DIAGNOSIS — Z794 Long term (current) use of insulin: Secondary | ICD-10-CM | POA: Insufficient documentation

## 2011-10-12 DIAGNOSIS — R3589 Other polyuria: Secondary | ICD-10-CM | POA: Insufficient documentation

## 2011-10-12 DIAGNOSIS — E119 Type 2 diabetes mellitus without complications: Secondary | ICD-10-CM | POA: Insufficient documentation

## 2011-10-12 DIAGNOSIS — Z9119 Patient's noncompliance with other medical treatment and regimen: Secondary | ICD-10-CM | POA: Insufficient documentation

## 2011-10-12 DIAGNOSIS — I10 Essential (primary) hypertension: Secondary | ICD-10-CM | POA: Insufficient documentation

## 2011-10-12 DIAGNOSIS — Z91199 Patient's noncompliance with other medical treatment and regimen due to unspecified reason: Secondary | ICD-10-CM | POA: Insufficient documentation

## 2011-10-12 DIAGNOSIS — R358 Other polyuria: Secondary | ICD-10-CM | POA: Insufficient documentation

## 2011-10-12 LAB — GLUCOSE, CAPILLARY

## 2011-10-12 LAB — CBC
HCT: 39.8 % (ref 39.0–52.0)
MCHC: 35.4 g/dL (ref 30.0–36.0)
MCV: 99 fL (ref 78.0–100.0)
Platelets: 207 10*3/uL (ref 150–400)
RDW: 12.6 % (ref 11.5–15.5)

## 2011-10-12 LAB — DIFFERENTIAL
Basophils Absolute: 0 10*3/uL (ref 0.0–0.1)
Basophils Relative: 1 % (ref 0–1)
Eosinophils Relative: 0 % (ref 0–5)
Monocytes Absolute: 0.4 10*3/uL (ref 0.1–1.0)

## 2011-10-12 LAB — COMPREHENSIVE METABOLIC PANEL
Albumin: 3.8 g/dL (ref 3.5–5.2)
BUN: 7 mg/dL (ref 6–23)
Calcium: 10.5 mg/dL (ref 8.4–10.5)
Chloride: 90 mEq/L — ABNORMAL LOW (ref 96–112)
Creatinine, Ser: 0.52 mg/dL (ref 0.50–1.35)
Total Bilirubin: 0.4 mg/dL (ref 0.3–1.2)
Total Protein: 8.3 g/dL (ref 6.0–8.3)

## 2011-10-12 LAB — POCT I-STAT 3, VENOUS BLOOD GAS (G3P V)
Bicarbonate: 25.1 mEq/L — ABNORMAL HIGH (ref 20.0–24.0)
pH, Ven: 7.389 — ABNORMAL HIGH (ref 7.250–7.300)

## 2011-10-12 LAB — LIPASE, BLOOD: Lipase: 28 U/L (ref 11–59)

## 2011-10-12 LAB — URINALYSIS, ROUTINE W REFLEX MICROSCOPIC
Bilirubin Urine: NEGATIVE
Ketones, ur: 40 mg/dL — AB
Nitrite: NEGATIVE
Protein, ur: NEGATIVE mg/dL
Urobilinogen, UA: 0.2 mg/dL (ref 0.0–1.0)

## 2011-10-12 MED ORDER — INSULIN GLARGINE 100 UNIT/ML ~~LOC~~ SOLN: SUBCUTANEOUS | Status: DC

## 2011-10-12 MED ORDER — INSULIN GLARGINE 100 UNIT/ML ~~LOC~~ SOLN
10.0000 [IU] | Freq: Once | SUBCUTANEOUS | Status: DC
  Filled 2011-10-12: qty 0.1

## 2011-10-12 MED ORDER — INSULIN ASPART 100 UNIT/ML ~~LOC~~ SOLN
10.0000 [IU] | Freq: Once | SUBCUTANEOUS | Status: AC
  Administered 2011-10-12: 10 [IU] via SUBCUTANEOUS
  Filled 2011-10-12: qty 1

## 2011-10-12 MED ORDER — SODIUM CHLORIDE 0.9 % IV BOLUS (SEPSIS)
1000.0000 mL | Freq: Once | INTRAVENOUS | Status: AC
  Administered 2011-10-12: 1000 mL via INTRAVENOUS

## 2011-10-12 NOTE — ED Notes (Signed)
Pt returned from out of the dept; pt put back on monitor, continuous pulse oximetry and blood pressure cuff

## 2011-10-12 NOTE — ED Notes (Signed)
Pts not feel well for a while, has been out of insulin x 2 months. Having pain to legs and unable to sleep. No acute distress noted at triage.

## 2011-10-12 NOTE — ED Notes (Signed)
Pt up ambulatory to the bathroom at this time 

## 2011-10-12 NOTE — ED Notes (Signed)
Pt states that he has been out of his prescriptions for insulin and has been having some high blood sugars for the past few days. He states that he has also been having right leg numbness and tingling that he associates with pain. Breath sounds are clear and bowel sounds are present. Iv started and protocols initiated. Bolus infusing. Pt to receive insulin and if blood sugars respond and he is not acidotic then he will be able to be discharged home. Family at the bedside.

## 2011-10-12 NOTE — ED Notes (Signed)
Pt placed on monitor, continuous pulse oximetry and blood pressure cuff; vitals retaken; EKG performed

## 2011-10-12 NOTE — ED Provider Notes (Addendum)
History     CSN: [id number] Arrival date & time: 10/12/2011 12:58 PM   First MD Initiated Contact with Patient 10/12/11 1544      Chief Complaint  Patient presents with  . Hyperglycemia    (Consider location/radiation/quality/duration/timing/severity/associated sxs/prior treatment) HPI  Patient states blood sugar high and bilateral foot pain.  States out of insulin for two months.  Patient has been diabetic for three years and was diagnosed in [place].  Patient has gotten insulin here at the hospital in the past year but has not established primary care.  Now scheduled to be seen at [facility] in January.  Patient was hospitalized last April with pancreatitis.  Patient with history etoh abuse- states has slowed down and now only drinks a 40 oz a day.  Some thc, no tobacco use.  Increase thirst, hunger, increased urination.  Patient states bs has been 400s when he checks it.  Some dyspnea with exertion.    Past Medical History  Diagnosis Date  . Diabetes mellitus   . Hypertension     History reviewed. No pertinent past surgical history.  History reviewed. No pertinent family history.  History  Substance Use Topics  . Smoking status: Never Smoker   . Smokeless tobacco: Not on file  . Alcohol Use: Yes     occ      Review of Systems  All other systems reviewed and are negative.    Allergies  Review of patient's allergies indicates no known allergies.  Home Medications   Current Outpatient Rx  Name Route Sig Dispense Refill  . INSULIN ASPART 100 UNIT/ML [place] SOLN Subcutaneous Inject 20 Units into the skin 3 (three) times daily before meals. 9 units before breakfast, 12 units before lunch and 9 units before dinner      BP 96/69  Pulse 96  Resp 18  SpO2 99%  Physical Exam  Nursing note and vitals reviewed. Constitutional: He is oriented to person, place, and time. He appears well-developed and well-nourished.  HENT:  Head: Normocephalic and atraumatic.  Eyes:  Conjunctivae and EOM are normal. Pupils are equal, round, and reactive to light.  Neck: Normal range of motion. Neck supple.  Cardiovascular: Normal rate, regular rhythm, normal heart sounds and intact distal pulses.   Pulmonary/Chest: Effort normal and breath sounds normal.  Abdominal: Soft. Bowel sounds are normal.  Musculoskeletal: Normal range of motion.  Neurological: He is alert and oriented to person, place, and time. He has normal reflexes.  Skin: Skin is warm and dry.  Psychiatric: He has a normal mood and affect. His behavior is normal. Judgment and thought content normal.    ED Course  Procedures (including critical care time)  Labs Reviewed  GLUCOSE, CAPILLARY - Abnormal; Notable for the following:    Glucose-Capillary 507 (*)    All other components within normal limits   No results found.   No diagnosis found.    MDM   Results for orders placed during the hospital encounter of 10/12/11  GLUCOSE, CAPILLARY      Component Value Range   Glucose-Capillary 507 (*) 70 - 99 (mg/dL)   Comment 1 Notify RN    CBC      Component Value Range   WBC 2.8 (*) 4.0 - 10.5 (K/uL)   RBC 4.02 (*) 4.22 - 5.81 (MIL/uL)   Hemoglobin 14.1  13.0 - 17.0 (g/dL)   HCT 78.2  95.6 - 21.3 (%)   MCV 99.0  78.0 - 100.0 (fL)   MCH 35.1 (*)  26.0 - 34.0 (pg)   MCHC 35.4  30.0 - 36.0 (g/dL)   RDW 16.1  09.6 - 04.5 (%)   Platelets 207  150 - 400 (K/uL)  DIFFERENTIAL      Component Value Range   Neutrophils Relative 48  43 - 77 (%)   Neutro Abs 1.4 (*) 1.7 - 7.7 (K/uL)   Lymphocytes Relative 39  12 - 46 (%)   Lymphs Abs 1.1  0.7 - 4.0 (K/uL)   Monocytes Relative 13 (*) 3 - 12 (%)   Monocytes Absolute 0.4  0.1 - 1.0 (K/uL)   Eosinophils Relative 0  0 - 5 (%)   Eosinophils Absolute 0.0  0.0 - 0.7 (K/uL)   Basophils Relative 1  0 - 1 (%)   Basophils Absolute 0.0  0.0 - 0.1 (K/uL)  URINALYSIS, ROUTINE W REFLEX MICROSCOPIC      Component Value Range   Color, Urine YELLOW  YELLOW     Appearance CLEAR  CLEAR    Specific Gravity, Urine 1.044 (*) 1.005 - 1.030    pH 5.0  5.0 - 8.0    Glucose, UA >1000 (*) NEGATIVE (mg/dL)   Hgb urine dipstick NEGATIVE  NEGATIVE    Bilirubin Urine NEGATIVE  NEGATIVE    Ketones, ur 40 (*) NEGATIVE (mg/dL)   Protein, ur NEGATIVE  NEGATIVE (mg/dL)   Urobilinogen, UA 0.2  0.0 - 1.0 (mg/dL)   Nitrite NEGATIVE  NEGATIVE    Leukocytes, UA NEGATIVE  NEGATIVE   COMPREHENSIVE METABOLIC PANEL      Component Value Range   Sodium 131 (*) 135 - 145 (mEq/L)   Potassium 4.4  3.5 - 5.1 (mEq/L)   Chloride 90 (*) 96 - 112 (mEq/L)   CO2 23  19 - 32 (mEq/L)   Glucose, Bld 393 (*) 70 - 99 (mg/dL)   BUN 7  6 - 23 (mg/dL)   Creatinine, Ser 4.09  0.50 - 1.35 (mg/dL)   Calcium 81.1  8.4 - 10.5 (mg/dL)   Total Protein 8.3  6.0 - 8.3 (g/dL)   Albumin 3.8  3.5 - 5.2 (g/dL)   AST 88 (*) 0 - 37 (U/L)   ALT 87 (*) 0 - 53 (U/L)   Alkaline Phosphatase 140 (*) 39 - 117 (U/L)   Total Bilirubin 0.4  0.3 - 1.2 (mg/dL)   GFR calc non Af Amer >90  >90 (mL/min)   GFR calc Af Amer >90  >90 (mL/min)  LIPASE, BLOOD      Component Value Range   Lipase 28  11 - 59 (U/L)  POCT I-STAT 3, BLOOD GAS (G3P V)      Component Value Range   pH, Ven 7.389 (*) 7.250 - 7.300    pCO2, Ven 41.6 (*) 45.0 - 50.0 (mmHg)   pO2, Ven 27.0 (*) 30.0 - 45.0 (mmHg)   Bicarbonate 25.1 (*) 20.0 - 24.0 (mEq/L)   TCO2 26  0 - 100 (mmol/L)   O2 Saturation 49.0     Sample type VENOUS     Comment NOTIFIED PHYSICIAN    GLUCOSE, CAPILLARY      Component Value Range   Glucose-Capillary 276 (*) 70 - 99 (mg/dL)   Comment 1 Documented in Chart     Comment 2 Notify RN    URINE MICROSCOPIC-ADD ON      Component Value Range   Urine-Other       Value: NO FORMED ELEMENTS SEEN ON URINE MICROSCOPIC EXAMINATION     Patient with bs  decreased here with one liter ns and 10 units insulin.  Plan refill insulin and follow up with [facility]      Hilario Quarry, MD 10/12/11 1839   Date: 10/12/2011   Rate: 84  Rhythm: normal sinus rhythm  QRS Axis: normal  Intervals: normal  ST/T Wave abnormalities: normal  Conduction Disutrbances:none  Narrative Interpretation:   Old EKG Reviewed: changes noted- qt is no longer prolonged    Hilario Quarry, MD 10/12/11 1853

## 2011-10-12 NOTE — ED Notes (Signed)
Pt getting undressed and into a gown at this time; pt aware of need of urine specimen; urinal handed to pt to use when he can

## 2011-10-12 NOTE — ED Notes (Signed)
Pt back to room from bathroom.  

## 2011-10-12 NOTE — ED Notes (Signed)
EKGs handed to Ray, MD 

## 2012-04-17 ENCOUNTER — Emergency Department (HOSPITAL_COMMUNITY): Payer: Self-pay

## 2012-04-17 ENCOUNTER — Inpatient Hospital Stay (HOSPITAL_COMMUNITY)
Admission: EM | Admit: 2012-04-17 | Discharge: 2012-04-19 | DRG: 637 | Disposition: A | Discharge status: Home / Self Care | Payer: Self-pay | Attending: Internal Medicine | Admitting: Internal Medicine

## 2012-04-17 ENCOUNTER — Encounter (HOSPITAL_COMMUNITY): Payer: Self-pay

## 2012-04-17 DIAGNOSIS — E119 Type 2 diabetes mellitus without complications: Secondary | ICD-10-CM

## 2012-04-17 DIAGNOSIS — E876 Hypokalemia: Secondary | ICD-10-CM | POA: Diagnosis present

## 2012-04-17 DIAGNOSIS — R55 Syncope and collapse: Secondary | ICD-10-CM

## 2012-04-17 DIAGNOSIS — E118 Type 2 diabetes mellitus with unspecified complications: Secondary | ICD-10-CM

## 2012-04-17 DIAGNOSIS — K7030 Alcoholic cirrhosis of liver without ascites: Secondary | ICD-10-CM

## 2012-04-17 DIAGNOSIS — E1165 Type 2 diabetes mellitus with hyperglycemia: Secondary | ICD-10-CM

## 2012-04-17 DIAGNOSIS — E1110 Type 2 diabetes mellitus with ketoacidosis without coma: Secondary | ICD-10-CM

## 2012-04-17 DIAGNOSIS — R7401 Elevation of levels of liver transaminase levels: Secondary | ICD-10-CM | POA: Diagnosis present

## 2012-04-17 DIAGNOSIS — D649 Anemia, unspecified: Secondary | ICD-10-CM | POA: Diagnosis present

## 2012-04-17 DIAGNOSIS — D61811 Other drug-induced pancytopenia: Secondary | ICD-10-CM | POA: Diagnosis present

## 2012-04-17 DIAGNOSIS — R7402 Elevation of levels of lactic acid dehydrogenase (LDH): Secondary | ICD-10-CM | POA: Diagnosis present

## 2012-04-17 DIAGNOSIS — E1142 Type 2 diabetes mellitus with diabetic polyneuropathy: Secondary | ICD-10-CM | POA: Diagnosis present

## 2012-04-17 DIAGNOSIS — R911 Solitary pulmonary nodule: Secondary | ICD-10-CM | POA: Diagnosis present

## 2012-04-17 DIAGNOSIS — F1010 Alcohol abuse, uncomplicated: Secondary | ICD-10-CM | POA: Diagnosis present

## 2012-04-17 DIAGNOSIS — E1069 Type 1 diabetes mellitus with other specified complication: Principal | ICD-10-CM | POA: Diagnosis present

## 2012-04-17 DIAGNOSIS — G629 Polyneuropathy, unspecified: Secondary | ICD-10-CM

## 2012-04-17 DIAGNOSIS — E1049 Type 1 diabetes mellitus with other diabetic neurological complication: Secondary | ICD-10-CM | POA: Diagnosis present

## 2012-04-17 DIAGNOSIS — T510X4A Toxic effect of ethanol, undetermined, initial encounter: Secondary | ICD-10-CM | POA: Diagnosis present

## 2012-04-17 DIAGNOSIS — E11621 Type 2 diabetes mellitus with foot ulcer: Secondary | ICD-10-CM | POA: Insufficient documentation

## 2012-04-17 DIAGNOSIS — Z794 Long term (current) use of insulin: Secondary | ICD-10-CM

## 2012-04-17 DIAGNOSIS — K859 Acute pancreatitis without necrosis or infection, unspecified: Secondary | ICD-10-CM

## 2012-04-17 DIAGNOSIS — D61818 Other pancytopenia: Secondary | ICD-10-CM | POA: Diagnosis present

## 2012-04-17 DIAGNOSIS — B1920 Unspecified viral hepatitis C without hepatic coma: Secondary | ICD-10-CM | POA: Diagnosis present

## 2012-04-17 DIAGNOSIS — E871 Hypo-osmolality and hyponatremia: Secondary | ICD-10-CM | POA: Diagnosis present

## 2012-04-17 DIAGNOSIS — N62 Hypertrophy of breast: Secondary | ICD-10-CM | POA: Diagnosis present

## 2012-04-17 DIAGNOSIS — T65891A Toxic effect of other specified substances, accidental (unintentional), initial encounter: Secondary | ICD-10-CM | POA: Diagnosis present

## 2012-04-17 HISTORY — DX: Hemorrhage, not elsewhere classified: R58

## 2012-04-17 HISTORY — DX: Reserved for inherently not codable concepts without codable children: IMO0001

## 2012-04-17 HISTORY — DX: Anemia, unspecified: D64.9

## 2012-04-17 HISTORY — DX: Encounter for other specified aftercare: Z51.89

## 2012-04-17 LAB — ABO/RH: ABO/RH(D): O POS

## 2012-04-17 LAB — BASIC METABOLIC PANEL
BUN: 4 mg/dL — ABNORMAL LOW (ref 6–23)
CO2: 24 mEq/L (ref 19–32)
Chloride: 96 mEq/L (ref 96–112)
Creatinine, Ser: 0.74 mg/dL (ref 0.50–1.35)
GFR calc Af Amer: >90 mL/min (ref >90)
Glucose, Bld: 93 mg/dL (ref 70–99)
Potassium: 3.3 mEq/L — ABNORMAL LOW (ref 3.5–5.1)

## 2012-04-17 LAB — URINALYSIS, ROUTINE W REFLEX MICROSCOPIC
Glucose, UA: 100 mg/dL — AB
Leukocytes, UA: NEGATIVE
pH: 8 (ref 5.0–8.0)

## 2012-04-17 MED ORDER — SODIUM CHLORIDE 0.9 % IV BOLUS (SEPSIS)
1000.0000 mL | Freq: Once | INTRAVENOUS | Status: AC
  Administered 2012-04-17: 1000 mL via INTRAVENOUS

## 2012-04-17 MED ORDER — SODIUM CHLORIDE 0.9 % IV SOLN: INTRAVENOUS | Status: DC

## 2012-04-17 MED ORDER — INSULIN ASPART 100 UNIT/ML ~~LOC~~ SOLN
0.0000 [IU] | SUBCUTANEOUS | Status: DC
  Administered 2012-04-18: 2 [IU] via SUBCUTANEOUS
  Administered 2012-04-18: 7 [IU] via SUBCUTANEOUS
  Administered 2012-04-18 – 2012-04-19 (×2): 1 [IU] via SUBCUTANEOUS

## 2012-04-17 MED ORDER — INSULIN ASPART 100 UNIT/ML ~~LOC~~ SOLN: 20.0000 [IU] | Freq: Three times a day (TID) | SUBCUTANEOUS | Status: DC

## 2012-04-17 MED ORDER — PNEUMOCOCCAL VAC POLYVALENT 25 MCG/0.5ML IJ INJ
0.5000 mL | INJECTION | INTRAMUSCULAR | Status: AC
  Administered 2012-04-18: 0.5 mL via INTRAMUSCULAR
  Filled 2012-04-17: qty 0.5

## 2012-04-17 MED ORDER — THIAMINE HCL 100 MG/ML IJ SOLN
Freq: Once | INTRAVENOUS | Status: AC
  Administered 2012-04-17: via INTRAVENOUS
  Filled 2012-04-17: qty 1000

## 2012-04-17 NOTE — ED Notes (Signed)
ZOX:[id number]<AV> Expected date:04/17/12<BR> Expected time:<BR> Means of arrival:<BR> Comments:<BR> EMS 60 GC - syncope/n/v

## 2012-04-17 NOTE — ED Notes (Signed)
Hemoccult card neg

## 2012-04-17 NOTE — ED Provider Notes (Signed)
History     CSN: [id number]  Arrival date & time 04/17/12  1425   First MD Initiated Contact with Patient 04/17/12 1517      Chief Complaint  Patient presents with  . Weakness  . Near Syncope    (Consider location/radiation/quality/duration/timing/severity/associated sxs/prior treatment) HPI Comments: [name] is a 36 y.o. Male  who became weak and almost passed out while shopping at [employer] today. He feels like he was awake and did not lose consciousness. He had to sit on the floor. He did not injure himself. He's been taking his insulin, but not eating well for the last 3 days. He denies cough, shortness of breath, chest pain, back pain, change in bowel or urinary habits. He's been using only insulin, and not any other treatments. He feels weak when he stands up.  Patient is a 36 y.o. male presenting with weakness. The history is provided by the patient.  Weakness  Additional symptoms include weakness.    Past Medical History  Diagnosis Date  . Diabetes mellitus   . Anemia   . Blood transfusion   . Bleeding     hx internal bleeding 1995 due to accident    History reviewed. No pertinent past surgical history.  History reviewed. No pertinent family history.  History  Substance Use Topics  . Smoking status: Former Smoker    Quit date: 04/17/1996  . Smokeless tobacco: Never Used  . Alcohol Use: Yes     occ      Review of Systems  Neurological: Positive for weakness.  All other systems reviewed and are negative.    Allergies  Review of patient's allergies indicates no known allergies.  Home Medications   Current Outpatient Rx  Name Route Sig Dispense Refill  . INSULIN ASPART 100 UNIT/ML [place] SOLN Subcutaneous Inject 20 Units into the skin 3 (three) times daily before meals. 9 units before breakfast, 12 units before lunch and 9 units before dinner    . INSULIN GLARGINE 100 UNIT/ML [place] SOLN  Use as previously instructed 10 mL 12    BP 111/82  Pulse 106   Temp(Src) 98.4 F (36.9 C) (Oral)  Resp 18  Ht 6\' 1"  (1.854 m)  Wt 150 lb (68.04 kg)  BMI 19.79 kg/m2  SpO2 100%  Physical Exam  Nursing note and vitals reviewed. Constitutional: He is oriented to person, place, and time. He appears well-developed.       He is underweight  HENT:  Head: Normocephalic and atraumatic.  Right Ear: External ear normal.  Left Ear: External ear normal.  Eyes: Conjunctivae and EOM are normal. Pupils are equal, round, and reactive to light.  Neck: Normal range of motion and phonation normal. Neck supple.  Cardiovascular: Normal rate, regular rhythm, normal heart sounds and intact distal pulses.   Pulmonary/Chest: Effort normal and breath sounds normal. He exhibits no bony tenderness.  Abdominal: Soft. Normal appearance. There is no tenderness.  Genitourinary:       Normal anus. Small amt. Brown stool in rectum  Musculoskeletal: Normal range of motion.  Neurological: He is alert and oriented to person, place, and time. He has normal strength. No cranial nerve deficit or sensory deficit. He exhibits normal muscle tone. Coordination normal.  Skin: Skin is warm, dry and intact.  Psychiatric: He has a normal mood and affect. His behavior is normal. Judgment and thought content normal.    ED Course  Procedures (including critical care time)   Date: 04/17/2012  Rate: 95  Rhythm: normal sinus rhythm  QRS Axis: normal  Intervals: normal  ST/T Wave abnormalities: nonspecific T wave changes  Conduction Disutrbances:none  Narrative Interpretation: diffuse TWA  Old EKG Reviewed: unchanged  19:15- hemoglobin. Return low at 5.6. Patient denies known source of blood loss. He states that 4 years ago he had severe anemia requiring a transfusion. At that time he was evaluated at another facility, but no source was found for the bleeding. Blood transfusion ordered. Admission arranged   CRITICAL CARE Performed by: Mancel Bale L   Total critical care time: 45  minutes  Critical care time was exclusive of separately billable procedures and treating other patients.  Critical care was necessary to treat or prevent imminent or life-threatening deterioration.  Critical care was time spent personally by me on the following activities: development of treatment plan with patient and/or surrogate as well as nursing, discussions with consultants, evaluation of patient's response to treatment, examination of patient, obtaining history from patient or surrogate, ordering and performing treatments and interventions, ordering and review of laboratory studies, ordering and review of radiographic studies, pulse oximetry and re-evaluation of patient's condition.  Labs Reviewed  GLUCOSE, CAPILLARY - Abnormal; Notable for the following:    Glucose-Capillary 115 (*)    All other components within normal limits  CBC - Abnormal; Notable for the following:    WBC 1.5 (*)    RBC 1.65 (*)    Hemoglobin 5.6 (*)    HCT 16.6 (*)    MCV 100.6 (*)    Platelets 66 (*)    All other components within normal limits  DIFFERENTIAL - Abnormal; Notable for the following:    Neutro Abs 1.0 (*)    Lymphs Abs 0.4 (*)    All other components within normal limits  URINALYSIS, ROUTINE W REFLEX MICROSCOPIC - Abnormal; Notable for the following:    Color, Urine AMBER (*) BIOCHEMICALS MAY BE AFFECTED BY COLOR   Glucose, UA 100 (*)    All other components within normal limits  TROPONIN I  OCCULT BLOOD, POC DEVICE  BASIC METABOLIC PANEL  URINE CULTURE  PATHOLOGIST SMEAR REVIEW  PREPARE RBC (CROSSMATCH)  TYPE AND SCREEN  ABO/RH   Dg Chest 2 View  04/17/2012  *RADIOLOGY REPORT*  Clinical Data: Syncope, shortness of breath.  CHEST - 2 VIEW  Comparison: 10/12/2011  Findings: Heart and mediastinal contours are within normal limits. No focal opacities or effusions.  No acute bony abnormality.  IMPRESSION: Normal study.  Original Report Authenticated By: Cyndie Chime, M.D.     1.  Pancytopenia   2. Near syncope       MDM  Pancytopenia, without rectal bleeding. This is the likely source of the near-syncope. Doubt ACS, metabolic instability or CVA. Patient is to be admitted for transfusion of hemoglobin and further evaluation of the blood disorder.  Plan: Admit- Hospitalist        Flint Melter, MD 04/19/12 [phone]

## 2012-04-17 NOTE — H&P (Signed)
History and Physical  [name] JYN:[id number] DOB: [birth year] DOA: 04/17/2012  Referring physician: PCP: No primary provider on file.   Chief Complaint: Symptomatic anemia   HPI:  36 yr old male was at [employer] this afternoon at about 3:30 was carrying groceries and has just walked into the store and got dizzy and fainted.  Was unconscious for about a couple of seconds.-came to right away and an ambulance was a called and was brought here.  This seems to have happened befroe in 2006 and was transfused 6 yrs ago at a hospital in [place]-apparently had a endoscopy and colonoscopy which were neg at the time of that evaluation as well.  No seizures, no Headaches, no altered mental status, no stroke like symtpoms Had no CP.  Has had some SOB only today with walking -has been feelign somwehat weak for the last 3 days and has been laying donw a whole lot per him.  No dark stool, no vomiting blood, still drinks ETOH drinkstwisted tea 2-3 24 ounce cans a day.  NO drugs currently.  Has been dizzy past couple of days Stopped smoking when finished school as well Compliant on insulin-sgars have been somewhat  Has no meter at home   Chart Review:  See above and below  Review of Systems:  Per HPI  Past Medical History  Diagnosis Date  . Diabetes mellitus   . Anemia   . Blood transfusion   . 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 No pertinent past surgical history.  Social History:  reports that he quit smoking about 16 years ago. He has never used smokeless tobacco. He reports that he drinks alcohol. He reports that he does not use illicit drugs.  No Known Allergies  History reviewed. No pertinent family history.   Prior to Admission medications   Medication Sig Start Date End Date Taking? Authorizing Provider  insulin  aspart (NOVOLOG) 100 UNIT/ML injection Inject 20 Units into the skin 3 (three) times daily before meals. 9 units before breakfast, 12 units before lunch and 9 units before dinner   Yes Historical Provider, MD  insulin glargine (LANTUS) 100 UNIT/ML injection Use as previously instructed 10/12/11  Yes Hilario Quarry, MD   Physical Exam: Filed Vitals:   04/17/12 1436 04/17/12 1700  BP: 105/81 111/82  Pulse: 102 106  Temp: 98.4 F (36.9 C)   TempSrc: Oral   Resp: 18   Height: 6\' 1"  (1.854 m)   Weight: 68.04 kg (150 lb)   SpO2: 100% 100%     General:  Alert oriented, after American male in no apparent distress  Eyes: Some pallor. No icterus  ENT: Poor dentition throat clear  Neck: Supple soft  Cardiovascular: S2 no murmur rub or gallop  Respiratory: Critically clear  Abdomen: Soft nontender nondistended  Skin: Mild lower extremity swelling decreased sensation to have a stocking areas  Musculoskeletal: Moves all 4 limbs equally  Psychiatric: Euthymic  Neurologic: Moving all 4 limbs equally, and nystatin 12 grossly intact  Labs on Admission:  Basic Metabolic Panel: No results found for this basename: NA:5,K:2,CL:5,CO2:5,GLUCOSE:5,BUN:5,CREATININE:5,CALCIUM:5,MG:5,PHOS:5 in the last 168 hours  Liver Function Tests: No results found for this basename: AST:5,ALT:5,ALKPHOS:5,BILITOT:5,PROT:5,ALBUMIN:5 in the last 168 hours No results found for this basename: LIPASE:5,AMYLASE:5 in the last 168 hours No results found for this basename:  AMMONIA:5 in the last 168 hours  CBC:  Lab 04/17/12 1820  WBC 1.5*  NEUTROABS 1.0*  HGB 5.6*  HCT 16.6*  MCV 100.6*  PLT 66*    Cardiac Enzymes:  Lab 04/17/12 1850  CKTOTAL --  CKMB --  CKMBINDEX --  TROPONINI <0.30    Troponin (Point of Care Test) No results found for this basename: TROPIPOC in the last 72 hours  BNP (last 3 results) No results found for this basename: PROBNP:3 in the last 8760 hours  CBG:  Lab 04/17/12  1606  GLUCAP 115*     Radiological Exams on Admission: Dg Chest 2 View  04/17/2012  *RADIOLOGY REPORT*  Clinical Data: Syncope, shortness of breath.  CHEST - 2 VIEW  Comparison: 10/12/2011  Findings: Heart and mediastinal contours are within normal limits. No focal opacities or effusions.  No acute bony abnormality.  IMPRESSION: Normal study.  Original Report Authenticated By: Cyndie Chime, M.D.    EKG: Independently reviewed. Sinus rhythm rate of about 80 PR interval is 0.02 QRS axis 75 some ST changes to V3 however not   Active Problems:  * No active hospital problems. *   Assessment/Plan 1. Pancytopenia-likely secondary to toxic effect of alcohol, who will get peripheral smear, iron panel, folate& Methymalonic acid level. Needs transfusion of at least 2 units packed red blood cells and repeat hemoglobin 3-4 hours after transfusion. If this anemia persists patient may benefit from repeat colonoscopy and GI and maybe hematology evaluation-hold on consulting GI for now until Gauic's x 3 done 2. Type 1 diabetes-Will allow clear fluids for now. Will reconcile meds and decrease dose of insulin to 10 units tid, and keep on lantus 5 for now with SSI.  Keep NPO as might need scope if anemia no better. 3. H/o DKA-Stable-not present currently  Code Status: Full  Family Communication: Gf in room Disposition Plan: In-patient-SDU for now, admit to dr. Earlene Plater, Tm 4  Pleas Koch, MD  [facility] Pager [phone]  If 8PM-8AM, please contact floor/night-coverage at [email or web address], password [facility] 04/17/2012, 9:20 PM

## 2012-04-17 NOTE — ED Notes (Signed)
Per [facility]- Pt presents with no acute distress.  Pt reports almost passing out at [employer]- Pt is a diabetic c/o of N/V for several days with poor appetite.  #18 left hand

## 2012-04-18 ENCOUNTER — Inpatient Hospital Stay (HOSPITAL_COMMUNITY): Payer: Self-pay

## 2012-04-18 DIAGNOSIS — R55 Syncope and collapse: Secondary | ICD-10-CM

## 2012-04-18 DIAGNOSIS — E1165 Type 2 diabetes mellitus with hyperglycemia: Secondary | ICD-10-CM

## 2012-04-18 DIAGNOSIS — G629 Polyneuropathy, unspecified: Secondary | ICD-10-CM | POA: Diagnosis present

## 2012-04-18 DIAGNOSIS — E871 Hypo-osmolality and hyponatremia: Secondary | ICD-10-CM | POA: Diagnosis present

## 2012-04-18 DIAGNOSIS — E118 Type 2 diabetes mellitus with unspecified complications: Secondary | ICD-10-CM

## 2012-04-18 DIAGNOSIS — F1010 Alcohol abuse, uncomplicated: Secondary | ICD-10-CM | POA: Diagnosis present

## 2012-04-18 DIAGNOSIS — K7030 Alcoholic cirrhosis of liver without ascites: Secondary | ICD-10-CM

## 2012-04-18 DIAGNOSIS — D649 Anemia, unspecified: Secondary | ICD-10-CM | POA: Diagnosis present

## 2012-04-18 DIAGNOSIS — E876 Hypokalemia: Secondary | ICD-10-CM | POA: Diagnosis present

## 2012-04-18 DIAGNOSIS — D61818 Other pancytopenia: Secondary | ICD-10-CM | POA: Diagnosis present

## 2012-04-18 LAB — GLUCOSE, CAPILLARY
Glucose-Capillary: 94 mg/dL (ref 70–99)
Glucose-Capillary: 77 mg/dL (ref 70–99)
Glucose-Capillary: 177 mg/dL — ABNORMAL HIGH (ref 70–99)
Glucose-Capillary: 80 mg/dL (ref 70–99)

## 2012-04-18 LAB — BASIC METABOLIC PANEL
BUN: <3 mg/dL — ABNORMAL LOW (ref 6–23)
Calcium: 8.6 mg/dL (ref 8.4–10.5)
Creatinine, Ser: 0.69 mg/dL (ref 0.50–1.35)
GFR calc Af Amer: >90 mL/min (ref >90)
GFR calc non Af Amer: >90 mL/min (ref >90)

## 2012-04-18 LAB — HEMOGLOBIN A1C
Hgb A1c MFr Bld: 11.1 % — ABNORMAL HIGH (ref <5.7)
Mean Plasma Glucose: 272 mg/dL — ABNORMAL HIGH (ref <117)

## 2012-04-18 LAB — TYPE AND SCREEN
Antibody Screen: NEGATIVE
Unit division: 0

## 2012-04-18 LAB — COMPREHENSIVE METABOLIC PANEL
ALT: 77 U/L — ABNORMAL HIGH (ref 0–53)
AST: 101 U/L — ABNORMAL HIGH (ref 0–37)
Albumin: 2.5 g/dL — ABNORMAL LOW (ref 3.5–5.2)
CO2: 26 mEq/L (ref 19–32)
Calcium: 8.6 mg/dL (ref 8.4–10.5)
Chloride: 98 mEq/L (ref 96–112)
GFR calc non Af Amer: >90 mL/min (ref >90)
Sodium: 133 mEq/L — ABNORMAL LOW (ref 135–145)

## 2012-04-18 LAB — POTASSIUM: Potassium: 3.3 mEq/L — ABNORMAL LOW (ref 3.5–5.1)

## 2012-04-18 LAB — MAGNESIUM: Magnesium: 1.2 mg/dL — ABNORMAL LOW (ref 1.5–2.5)

## 2012-04-18 LAB — CBC
HCT: 32.9 % — ABNORMAL LOW (ref 39.0–52.0)
Hemoglobin: 11.3 g/dL — ABNORMAL LOW (ref 13.0–17.0)
Hemoglobin: 11.6 g/dL — ABNORMAL LOW (ref 13.0–17.0)
MCH: 34 pg (ref 26.0–34.0)
MCHC: 34.3 g/dL (ref 30.0–36.0)
MCHC: 34.3 g/dL (ref 30.0–36.0)
Platelets: 129 10*3/uL — ABNORMAL LOW (ref 150–400)
RBC: 3.34 MIL/uL — ABNORMAL LOW (ref 4.22–5.81)
RBC: 3.41 MIL/uL — ABNORMAL LOW (ref 4.22–5.81)
RDW: 13.8 % (ref 11.5–15.5)
RDW: 14.1 % (ref 11.5–15.5)
WBC: 3.1 10*3/uL — ABNORMAL LOW (ref 4.0–10.5)
WBC: 3.8 10*3/uL — ABNORMAL LOW (ref 4.0–10.5)
WBC: 4 10*3/uL (ref 4.0–10.5)

## 2012-04-18 LAB — IRON AND TIBC: UIBC: <15 ug/dL — ABNORMAL LOW (ref 125–400)

## 2012-04-18 LAB — D-DIMER, QUANTITATIVE: D-Dimer, Quant: 0.7 ug/mL-FEU — ABNORMAL HIGH (ref 0.00–0.48)

## 2012-04-18 LAB — FOLATE RBC: RBC Folate: 696 ng/mL — ABNORMAL HIGH (ref >=366)

## 2012-04-18 LAB — MRSA PCR SCREENING: MRSA by PCR: NEGATIVE

## 2012-04-18 LAB — FERRITIN: Ferritin: 2569 ng/mL — ABNORMAL HIGH (ref 22–322)

## 2012-04-18 LAB — CARDIAC PANEL(CRET KIN+CKTOT+MB+TROPI)
Relative Index: 2.5 (ref 0.0–2.5)
Total CK: 145 U/L (ref 7–232)

## 2012-04-18 MED ORDER — THIAMINE HCL 100 MG/ML IJ SOLN
100.0000 mg | Freq: Every day | INTRAMUSCULAR | Status: DC
  Filled 2012-04-18 (×2): qty 1

## 2012-04-18 MED ORDER — POTASSIUM CHLORIDE CRYS ER 20 MEQ PO TBCR
40.0000 meq | EXTENDED_RELEASE_TABLET | ORAL | Status: AC
  Administered 2012-04-18 (×2): 40 meq via ORAL
  Filled 2012-04-18 (×2): qty 2

## 2012-04-18 MED ORDER — SODIUM CHLORIDE 0.9 % IV SOLN
INTRAVENOUS | Status: DC
  Administered 2012-04-18: 1000 mL via INTRAVENOUS
  Administered 2012-04-18: 20:00:00 via INTRAVENOUS
  Administered 2012-04-18: 1000 mL via INTRAVENOUS
  Administered 2012-04-19: 10:00:00 via INTRAVENOUS

## 2012-04-18 MED ORDER — POTASSIUM CHLORIDE 20 MEQ/15ML (10%) PO LIQD: 40.0000 meq | Freq: Every day | ORAL | Status: DC

## 2012-04-18 MED ORDER — FOLIC ACID 1 MG PO TABS
1.0000 mg | ORAL_TABLET | Freq: Every day | ORAL | Status: DC
  Administered 2012-04-18 – 2012-04-19 (×2): 1 mg via ORAL
  Filled 2012-04-18 (×2): qty 1

## 2012-04-18 MED ORDER — ADULT MULTIVITAMIN W/MINERALS CH
1.0000 | ORAL_TABLET | Freq: Every day | ORAL | Status: DC
  Administered 2012-04-18 – 2012-04-19 (×2): 1 via ORAL
  Filled 2012-04-18 (×2): qty 1

## 2012-04-18 MED ORDER — HYDROCODONE-ACETAMINOPHEN 5-325 MG PO TABS
1.0000 | ORAL_TABLET | Freq: Once | ORAL | Status: AC
  Administered 2012-04-18: 1 via ORAL
  Filled 2012-04-18: qty 1

## 2012-04-18 MED ORDER — POTASSIUM CHLORIDE 10 MEQ/100ML IV SOLN
10.0000 meq | INTRAVENOUS | Status: AC
  Administered 2012-04-18 (×2): 10 meq via INTRAVENOUS
  Filled 2012-04-18 (×2): qty 100

## 2012-04-18 MED ORDER — MAGNESIUM SULFATE 40 MG/ML IJ SOLN
2.0000 g | Freq: Once | INTRAMUSCULAR | Status: AC
  Administered 2012-04-18: 2 g via INTRAVENOUS
  Filled 2012-04-18: qty 50

## 2012-04-18 MED ORDER — GLUCOSE 40 % PO GEL
ORAL | Status: AC
  Administered 2012-04-18: 1
  Filled 2012-04-18: qty 1

## 2012-04-18 MED ORDER — POTASSIUM CHLORIDE CRYS ER 20 MEQ PO TBCR: 20.0000 meq | EXTENDED_RELEASE_TABLET | Freq: Once | ORAL | Status: DC

## 2012-04-18 MED ORDER — THIAMINE HCL 100 MG/ML IJ SOLN: INTRAVENOUS | Status: DC

## 2012-04-18 MED ORDER — LORAZEPAM 1 MG PO TABS: 1.0000 mg | ORAL_TABLET | Freq: Four times a day (QID) | ORAL | Status: DC | PRN

## 2012-04-18 MED ORDER — LORAZEPAM 2 MG/ML IJ SOLN: 1.0000 mg | Freq: Four times a day (QID) | INTRAMUSCULAR | Status: DC | PRN

## 2012-04-18 MED ORDER — CYCLOBENZAPRINE HCL 10 MG PO TABS
10.0000 mg | ORAL_TABLET | Freq: Once | ORAL | Status: AC
  Administered 2012-04-18: 10 mg via ORAL
  Filled 2012-04-18: qty 1

## 2012-04-18 MED ORDER — VITAMIN B-1 100 MG PO TABS
100.0000 mg | ORAL_TABLET | Freq: Every day | ORAL | Status: DC
  Administered 2012-04-18 – 2012-04-19 (×2): 100 mg via ORAL
  Filled 2012-04-18 (×2): qty 1

## 2012-04-18 MED ORDER — INSULIN ASPART 100 UNIT/ML ~~LOC~~ SOLN
10.0000 [IU] | Freq: Three times a day (TID) | SUBCUTANEOUS | Status: DC
  Administered 2012-04-18 – 2012-04-19 (×3): 10 [IU] via SUBCUTANEOUS

## 2012-04-18 NOTE — Progress Notes (Signed)
Inpatient Diabetes Program Recommendations  AACE/ADA: New Consensus Statement on Inpatient Glycemic Control (2009)  Target Ranges:  Prepandial:   less than 140 mg/dL      Peak postprandial:   less than 180 mg/dL (1-2 hours)      Critically ill patients:  140 - 180 mg/dL   Reason for Visit: Hypoglycemia with Type 1 DM  36 yr old male was at [employer] this afternoon at about 3:30 was carrying groceries and has just walked into the store and got dizzy and fainted. Was unconscious for about a couple of seconds.-came to right away and an ambulance was a called and was brought here. This seems to have happened befroe in 2006 and was transfused 6 yrs ago at a hospital [place]-apparently had a endoscopy and colonoscopy which were neg at the time of that evaluation as well. No seizures, no Headaches, no altered mental status, no stroke like symtpoms  Had no CP. Has had some SOB only today with walking -has been feelign somwehat weak for the last 3 days and has been laying donw a whole lot per him. No dark stool, no vomiting blood, still drinks ETOH drinkstwisted tea 2-3 24 ounce cans a day. NO drugs currently. Has been dizzy past couple of days  Pt has no meter but states he does take his insulin.  Gets insulin at [facility].  Educated pt on importance of checking blood sugars daily.  Gave info regarding [employer] glucose meter at [facility] that is $10.   Recommendations:  Pt would benefit from addition of basal insulin.  Recommend Lantus 20 QHS.  Should be helpful in eliminating wide swings in blood sugars resulting in hypoglycemia as well as hyperglycemia.  HgbA1C of 11.1% indicates poor glycemic control.  Long discussion with pt regarding importance of purchasing glucose meter.  Verbalized understanding.    Note: Would recommend 50% basal 50% bolus for insulin regimen.  ? Why pt only taking rapid-acting insulin.

## 2012-04-18 NOTE — Progress Notes (Signed)
Subjective: Patient feels fine today,  Complains of being hungry.  He also complains of tingling in the feet.  Objective: Weight change:   Intake/Output Summary (Last 24 hours) at 04/18/12 0955 Last data filed at 04/18/12 0700  Gross per 24 hour  Intake 1179.17 ml  Output    301 ml  Net 878.17 ml    Filed Vitals:   04/18/12 0800  BP:   Pulse:   Temp: 98.1 F (36.7 C)  Resp:   General: Patient appears older than his stated age. She doesn't seem to be in any acute distress. Cardiovascular: Regular rate rhythm Lungs: Clear to auscultation bilaterally Abdomen: Positive bowel sounds Extremities: No edema.   Lab Results: reviewed  Micro Results: Recent Results (from the past 240 hour(s))  MRSA PCR SCREENING     Status: Normal   Collection Time   04/18/12 12:35 AM      Component Value Range Status Comment   MRSA by PCR NEGATIVE  NEGATIVE  Final     Studies/Results: Dg Chest 2 View  04/17/2012  *RADIOLOGY REPORT*  Clinical Data: Syncope, shortness of breath.  CHEST - 2 VIEW  Comparison: 10/12/2011  Findings: Heart and mediastinal contours are within normal limits. No focal opacities or effusions.  No acute bony abnormality.  IMPRESSION: Normal study.  Original Report Authenticated By: Cyndie Chime, M.D.   Medications: Scheduled Meds:   . cyclobenzaprine  10 mg Oral Once  . dextrose      . folic acid  1 mg Oral Daily  . HYDROcodone-acetaminophen  1 tablet Oral Once  . insulin aspart  0-9 Units Subcutaneous Q4H  . insulin aspart  10 Units Subcutaneous TID AC  . mulitivitamin with minerals  1 tablet Oral Daily  . pneumococcal 23 valent vaccine  0.5 mL Intramuscular Tomorrow-1000  . potassium chloride  10 mEq Intravenous Q1 Hr x 2  . potassium chloride  40 mEq Oral Q4H  . general admission iv infusion   Intravenous Once  . sodium chloride  1,000 mL Intravenous Once  . sodium chloride  1,000 mL Intravenous Once  . thiamine  100 mg Oral Daily   Or  . thiamine  100 mg  Intravenous Daily  . DISCONTD: insulin aspart  20 Units Subcutaneous TID AC  . DISCONTD: potassium chloride  40 mEq Oral Daily  . DISCONTD: potassium chloride  20 mEq Oral Once   Continuous Infusions:   . sodium chloride 1,000 mL (04/18/12 0816)  . DISCONTD: sodium chloride    . DISCONTD: general admission iv infusion Stopped (04/18/12 0817)   PRN Meds:.LORazepam, LORazepam  Assessment/Plan: Syncope I am unsure of the etiology of his syncope. It could be that with his low by mouth intake and taking diabetic meds because of the hypoglycemic. Will check head CT carotid Dopplers cycle cardiac markers. DiaBETES MELLITUS, TYPE II (08/16/2010) On arrival to the ICU patient's blood sugars have been running low. Per discussion with patient he was drinking heavily a few days ago and for the past few days he has not been eating but he has been taking his insulin. He does not have a glucose meter to check his blood sugars. Per EMS report his blood sugar was 200 but that is probably an error because when he got to the ICU blood sugars have been running in the 50s 60s. 2 syncope could be related to low blood sugars. Adjusted his insulin.    Transaminitis (04/18/2012) Patient's transaminitis most likely alcohol related, his hepatitis C  virus was positive in the past. Will check a viral load. Will monitor.    Hyponatremia (04/18/2012) Hyponatremia probably secondary to dehydration and is improving. Will monitor.    Anemia (04/18/2012) Patient anemia is probably secondary to bone marrow suppression from his alcohol use and poor by mouth intake. Will monitor.   Alcohol abuse (04/18/2012) Patient states that his last drink was Sunday. He has not gone through any withdrawal symptoms. Will start him on alcohol withdrawal protocol. Gave him folic acid and thiamine.  Hypokalemia (04/18/2012) Potassium is low probably from poor by mouth intake. Replete potassium and also replete magnesium. Magnesium level is low  as well.   Pancytopenia (04/18/2012) This is most like secondary to bone marrow suppression from alcohol use and poor nutrition. Will monitor  Neuropathy Patient complains of pain in both legs, possibly diabetic neuropathy. Will check hemoglobin A1c and B12 levels.   Positive hep C TChec hep C viral load.   Disposition: Transfer to telemetry    LOS: 1 day   [name] 04/18/2012, 9:55 AM Pager [phone]

## 2012-04-18 NOTE — Progress Notes (Signed)
[facility] follow-up progress note (same day) Notified by RN that they'll check the delta chekc hemoglobin came back at 11.6. Unclear what caused the dizziness is probably was not anemia. We will replete volume his specific case with IV fluids with banana bag and reassess him in the morning. If he does not have a drop in his hemoglobin and orthostatics are negative we would potentially be able to discharge him home

## 2012-04-18 NOTE — Progress Notes (Signed)
Dr. Earlene Plater texted D dimer results .70.

## 2012-04-19 ENCOUNTER — Inpatient Hospital Stay (HOSPITAL_COMMUNITY): Payer: Self-pay

## 2012-04-19 DIAGNOSIS — K7030 Alcoholic cirrhosis of liver without ascites: Secondary | ICD-10-CM

## 2012-04-19 DIAGNOSIS — R55 Syncope and collapse: Secondary | ICD-10-CM

## 2012-04-19 DIAGNOSIS — E118 Type 2 diabetes mellitus with unspecified complications: Secondary | ICD-10-CM

## 2012-04-19 DIAGNOSIS — E1165 Type 2 diabetes mellitus with hyperglycemia: Secondary | ICD-10-CM

## 2012-04-19 LAB — COMPREHENSIVE METABOLIC PANEL
AST: 61 U/L — ABNORMAL HIGH (ref 0–37)
Albumin: 2.3 g/dL — ABNORMAL LOW (ref 3.5–5.2)
BUN: 3 mg/dL — ABNORMAL LOW (ref 6–23)
Creatinine, Ser: 0.69 mg/dL (ref 0.50–1.35)
Potassium: 3.6 mEq/L (ref 3.5–5.1)
Total Protein: 5.7 g/dL — ABNORMAL LOW (ref 6.0–8.3)

## 2012-04-19 LAB — CBC
HCT: 32.4 % — ABNORMAL LOW (ref 39.0–52.0)
MCHC: 33.3 g/dL (ref 30.0–36.0)
Platelets: 116 10*3/uL — ABNORMAL LOW (ref 150–400)
RDW: 14 % (ref 11.5–15.5)

## 2012-04-19 LAB — URINE CULTURE
Colony Count: NO GROWTH
Culture  Setup Time: 201305220408

## 2012-04-19 LAB — GLUCOSE, CAPILLARY
Glucose-Capillary: 102 mg/dL — ABNORMAL HIGH (ref 70–99)
Glucose-Capillary: 144 mg/dL — ABNORMAL HIGH (ref 70–99)

## 2012-04-19 LAB — CARDIAC PANEL(CRET KIN+CKTOT+MB+TROPI)
CK, MB: 3.9 ng/mL (ref 0.3–4.0)
Relative Index: 2.6 — ABNORMAL HIGH (ref 0.0–2.5)
Troponin I: <0.3 ng/mL (ref <0.30)
Troponin I: <0.3 ng/mL (ref <0.30)

## 2012-04-19 LAB — HEMOGLOBIN A1C: Mean Plasma Glucose: 258 mg/dL — ABNORMAL HIGH (ref <117)

## 2012-04-19 MED ORDER — INSULIN GLARGINE 100 UNIT/ML ~~LOC~~ SOLN: SUBCUTANEOUS | Status: DC

## 2012-04-19 MED ORDER — INSULIN ASPART 100 UNIT/ML ~~LOC~~ SOLN: 15.0000 [IU] | Freq: Three times a day (TID) | SUBCUTANEOUS | Status: DC

## 2012-04-19 MED ORDER — THIAMINE HCL 100 MG PO TABS: 100.0000 mg | ORAL_TABLET | Freq: Every day | ORAL | Status: DC

## 2012-04-19 MED ORDER — GABAPENTIN 100 MG PO CAPS: 300.0000 mg | ORAL_CAPSULE | Freq: Three times a day (TID) | ORAL | Status: DC

## 2012-04-19 MED ORDER — IOHEXOL 300 MG/ML  SOLN
100.0000 mL | Freq: Once | INTRAMUSCULAR | Status: AC | PRN
  Administered 2012-04-19: 100 mL via INTRAVENOUS

## 2012-04-19 MED ORDER — ADULT MULTIVITAMIN W/MINERALS CH: 1.0000 | ORAL_TABLET | Freq: Every day | ORAL | Status: DC
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
## 2012-04-19 NOTE — Progress Notes (Signed)
Pt verbalized discharge instructions.  Pt assessment has not changed from am.  Pt was given d/c forms and prescriptions.

## 2012-04-19 NOTE — Progress Notes (Signed)
Talked to patient about follow up medical care; Patient is established with [facility] for medical care - eligibility apt is May 07, 2012 at 2:30 and apt with Dr Andrey Campanile for June 04, 2012 at 10:30; Encouraged patient to keep his apt. Talked to the pharmacist at [facility] - Lantus insulin 3 month supply is $12.00 through their patient assistance program and the Novolog pen is $10.00 a month. Patient states that he does not have a job and have a hard time paying for his medication. Question patient about buying alcohol. Instructed patient to put his money on his medication and not on alcohol because his health is more important. Patient receives money from his mother and girlfriend and is trying to get medicaid. Abelino Derrick RN, BSN, [employer]

## 2012-07-30 ENCOUNTER — Encounter (HOSPITAL_COMMUNITY): Payer: Self-pay | Admitting: Family Medicine

## 2012-07-30 ENCOUNTER — Emergency Department (HOSPITAL_COMMUNITY)
Admission: EM | Admit: 2012-07-30 | Discharge: 2012-07-30 | Disposition: A | Discharge status: Home / Self Care | Payer: Self-pay | Attending: Emergency Medicine | Admitting: Emergency Medicine

## 2012-07-30 DIAGNOSIS — R112 Nausea with vomiting, unspecified: Secondary | ICD-10-CM | POA: Insufficient documentation

## 2012-07-30 DIAGNOSIS — R1110 Vomiting, unspecified: Secondary | ICD-10-CM

## 2012-07-30 DIAGNOSIS — E119 Type 2 diabetes mellitus without complications: Secondary | ICD-10-CM | POA: Insufficient documentation

## 2012-07-30 DIAGNOSIS — E139 Other specified diabetes mellitus without complications: Secondary | ICD-10-CM

## 2012-07-30 DIAGNOSIS — Z794 Long term (current) use of insulin: Secondary | ICD-10-CM | POA: Insufficient documentation

## 2012-07-30 LAB — CBC WITH DIFFERENTIAL/PLATELET
Basophils Relative: 1 % (ref 0–1)
Eosinophils Absolute: 0 10*3/uL (ref 0.0–0.7)
Eosinophils Relative: 0 % (ref 0–5)
HCT: 42.4 % (ref 39.0–52.0)
Hemoglobin: 15.4 g/dL (ref 13.0–17.0)
MCH: 33.3 pg (ref 26.0–34.0)
MCHC: 36.3 g/dL — ABNORMAL HIGH (ref 30.0–36.0)
MCV: 91.6 fL (ref 78.0–100.0)
Monocytes Absolute: 0.5 10*3/uL (ref 0.1–1.0)
Monocytes Relative: 12 % (ref 3–12)
RDW: 14.2 % (ref 11.5–15.5)

## 2012-07-30 LAB — COMPREHENSIVE METABOLIC PANEL
Albumin: 3.9 g/dL (ref 3.5–5.2)
BUN: 5 mg/dL — ABNORMAL LOW (ref 6–23)
Calcium: 9.9 mg/dL (ref 8.4–10.5)
Chloride: 95 mEq/L — ABNORMAL LOW (ref 96–112)
Creatinine, Ser: 0.62 mg/dL (ref 0.50–1.35)
Total Bilirubin: 0.4 mg/dL (ref 0.3–1.2)

## 2012-07-30 LAB — LIPASE, BLOOD: Lipase: 19 U/L (ref 11–59)

## 2012-07-30 MED ORDER — RANITIDINE HCL 150 MG PO CAPS: 150.0000 mg | ORAL_CAPSULE | Freq: Two times a day (BID) | ORAL | Status: DC

## 2012-07-30 MED ORDER — SODIUM CHLORIDE 0.9 % IV BOLUS (SEPSIS)
1000.0000 mL | Freq: Once | INTRAVENOUS | Status: AC
  Administered 2012-07-30: 1000 mL via INTRAVENOUS

## 2012-07-30 MED ORDER — TRAMADOL HCL 50 MG PO TABS: 50.0000 mg | ORAL_TABLET | Freq: Four times a day (QID) | ORAL | Status: AC | PRN

## 2012-07-30 MED ORDER — PROMETHAZINE HCL 25 MG PO TABS: 25.0000 mg | ORAL_TABLET | Freq: Four times a day (QID) | ORAL | Status: DC | PRN

## 2012-07-30 MED ORDER — INSULIN GLARGINE 100 UNIT/ML ~~LOC~~ SOLN: 15.0000 [IU] | Freq: Every day | SUBCUTANEOUS | Status: DC

## 2012-07-30 MED ORDER — ONDANSETRON HCL 4 MG/2ML IJ SOLN
4.0000 mg | Freq: Once | INTRAMUSCULAR | Status: AC
  Administered 2012-07-30: 4 mg via INTRAVENOUS
  Filled 2012-07-30: qty 2

## 2012-07-30 NOTE — ED Notes (Signed)
[id number]<QI> Expected date:<BR> Expected time: 3:20 PM<BR> Means of arrival:Ambulance<BR> Comments:<BR> M61- 36yoM- n/v, weakness, cbg 353

## 2012-07-30 NOTE — ED Notes (Signed)
Pt states he has been out of his lantus for about a month.

## 2012-07-30 NOTE — Discharge Instructions (Signed)
Follow up for recheck this week. °

## 2012-07-30 NOTE — ED Notes (Signed)
Patient is resting comfortably. 

## 2012-07-30 NOTE — ED Notes (Signed)
Pt unable to afford medications.  Prescriptions approved by case manager for pharmacy to fill x3 days.  Pt made aware of plan and will be discharged after.  Pharmacy called and prescriptions sent to be filled by pharmacy.

## 2012-07-30 NOTE — ED Notes (Signed)
Case manager called about help with getting medications filled.

## 2012-07-30 NOTE — ED Notes (Signed)
Pt reports feeling "weak for a couple weeks" and nausea and vomiting that started today. Pt states he is out of strips to check his blood sugar.
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
## 2012-07-31 NOTE — Progress Notes (Signed)
Consulted by [facility] to provide medication assistance. I verified through both [facility] and [facility] pharmacies that patient is eligible for the [employer]. Authorization was provided to the attending nurse to fill the scripts.

## 2018-07-05 ENCOUNTER — Inpatient Hospital Stay (HOSPITAL_COMMUNITY)
Admission: EM | Admit: 2018-07-05 | Discharge: 2018-07-09 | DRG: 638 | Disposition: A | Discharge status: Home / Self Care | Payer: Self-pay | Attending: Internal Medicine | Admitting: Internal Medicine

## 2018-07-05 ENCOUNTER — Encounter (HOSPITAL_COMMUNITY): Payer: Self-pay

## 2018-07-05 ENCOUNTER — Emergency Department (HOSPITAL_COMMUNITY): Payer: Self-pay

## 2018-07-05 ENCOUNTER — Other Ambulatory Visit: Payer: Self-pay

## 2018-07-05 DIAGNOSIS — E119 Type 2 diabetes mellitus without complications: Secondary | ICD-10-CM | POA: Diagnosis present

## 2018-07-05 DIAGNOSIS — L089 Local infection of the skin and subcutaneous tissue, unspecified: Secondary | ICD-10-CM

## 2018-07-05 DIAGNOSIS — R718 Other abnormality of red blood cells: Secondary | ICD-10-CM | POA: Diagnosis present

## 2018-07-05 DIAGNOSIS — E538 Deficiency of other specified B group vitamins: Secondary | ICD-10-CM | POA: Diagnosis present

## 2018-07-05 DIAGNOSIS — T3695XA Adverse effect of unspecified systemic antibiotic, initial encounter: Secondary | ICD-10-CM | POA: Diagnosis not present

## 2018-07-05 DIAGNOSIS — E11621 Type 2 diabetes mellitus with foot ulcer: Principal | ICD-10-CM | POA: Diagnosis present

## 2018-07-05 DIAGNOSIS — E11649 Type 2 diabetes mellitus with hypoglycemia without coma: Secondary | ICD-10-CM | POA: Diagnosis not present

## 2018-07-05 DIAGNOSIS — E876 Hypokalemia: Secondary | ICD-10-CM | POA: Diagnosis present

## 2018-07-05 DIAGNOSIS — T148XXA Other injury of unspecified body region, initial encounter: Secondary | ICD-10-CM

## 2018-07-05 DIAGNOSIS — E1142 Type 2 diabetes mellitus with diabetic polyneuropathy: Secondary | ICD-10-CM | POA: Diagnosis present

## 2018-07-05 DIAGNOSIS — R110 Nausea: Secondary | ICD-10-CM | POA: Diagnosis not present

## 2018-07-05 DIAGNOSIS — Z833 Family history of diabetes mellitus: Secondary | ICD-10-CM

## 2018-07-05 DIAGNOSIS — E1165 Type 2 diabetes mellitus with hyperglycemia: Secondary | ICD-10-CM | POA: Diagnosis present

## 2018-07-05 DIAGNOSIS — B351 Tinea unguium: Secondary | ICD-10-CM | POA: Diagnosis present

## 2018-07-05 DIAGNOSIS — E669 Obesity, unspecified: Secondary | ICD-10-CM | POA: Diagnosis present

## 2018-07-05 DIAGNOSIS — Z6828 Body mass index (BMI) 28.0-28.9, adult: Secondary | ICD-10-CM

## 2018-07-05 DIAGNOSIS — Z794 Long term (current) use of insulin: Secondary | ICD-10-CM

## 2018-07-05 DIAGNOSIS — L97529 Non-pressure chronic ulcer of other part of left foot with unspecified severity: Secondary | ICD-10-CM | POA: Diagnosis present

## 2018-07-05 DIAGNOSIS — Z87891 Personal history of nicotine dependence: Secondary | ICD-10-CM

## 2018-07-05 DIAGNOSIS — L03116 Cellulitis of left lower limb: Secondary | ICD-10-CM | POA: Diagnosis present

## 2018-07-05 LAB — BASIC METABOLIC PANEL
Anion gap: 13 (ref 5–15)
BUN: 8 mg/dL (ref 6–20)
CALCIUM: 9.7 mg/dL (ref 8.9–10.3)
CO2: 23 mmol/L (ref 22–32)
CREATININE: 0.83 mg/dL (ref 0.61–1.24)
Chloride: 99 mmol/L (ref 98–111)
GFR calc Af Amer: >60 mL/min (ref >60)
Glucose, Bld: 445 mg/dL — ABNORMAL HIGH (ref 70–99)
POTASSIUM: 4.2 mmol/L (ref 3.5–5.1)
SODIUM: 135 mmol/L (ref 135–145)

## 2018-07-05 LAB — CBC WITH DIFFERENTIAL/PLATELET
Basophils Absolute: 0 10*3/uL (ref 0.0–0.1)
Basophils Relative: 0 %
EOS ABS: 0 10*3/uL (ref 0.0–0.7)
EOS PCT: 0 %
HCT: 40.7 % (ref 39.0–52.0)
Hemoglobin: 14.2 g/dL (ref 13.0–17.0)
Lymphocytes Relative: 17 %
Lymphs Abs: 1 10*3/uL (ref 0.7–4.0)
MCH: 35.6 pg — ABNORMAL HIGH (ref 26.0–34.0)
MCHC: 34.9 g/dL (ref 30.0–36.0)
MCV: 102 fL — ABNORMAL HIGH (ref 78.0–100.0)
Monocytes Absolute: 1 10*3/uL (ref 0.1–1.0)
Monocytes Relative: 16 %
Neutro Abs: 4.1 10*3/uL (ref 1.7–7.7)
Neutrophils Relative %: 67 %
PLATELETS: 271 10*3/uL (ref 150–400)
RBC: 3.99 MIL/uL — AB (ref 4.22–5.81)
RDW: 12.4 % (ref 11.5–15.5)
WBC: 6.1 10*3/uL (ref 4.0–10.5)

## 2018-07-05 LAB — CBG MONITORING, ED: Glucose-Capillary: 480 mg/dL — ABNORMAL HIGH (ref 70–99)

## 2018-07-05 LAB — SEDIMENTATION RATE: SED RATE: 102 mm/h — AB (ref 0–16)

## 2018-07-05 LAB — HEMOGLOBIN A1C
Hgb A1c MFr Bld: 11.6 % — ABNORMAL HIGH (ref 4.8–5.6)
MEAN PLASMA GLUCOSE: 286.22 mg/dL

## 2018-07-05 LAB — GLUCOSE, CAPILLARY
GLUCOSE-CAPILLARY: 65 mg/dL — AB (ref 70–99)
Glucose-Capillary: 124 mg/dL — ABNORMAL HIGH (ref 70–99)

## 2018-07-05 LAB — PREALBUMIN: Prealbumin: 12.8 mg/dL — ABNORMAL LOW (ref 18–38)

## 2018-07-05 LAB — C-REACTIVE PROTEIN: CRP: 3.7 mg/dL — AB (ref <1.0)

## 2018-07-05 MED ORDER — INSULIN ASPART 100 UNIT/ML ~~LOC~~ SOLN
0.0000 [IU] | Freq: Three times a day (TID) | SUBCUTANEOUS | Status: DC
  Administered 2018-07-05: 5 [IU] via SUBCUTANEOUS
  Administered 2018-07-06: 2 [IU] via SUBCUTANEOUS
  Administered 2018-07-06 – 2018-07-07 (×3): 5 [IU] via SUBCUTANEOUS
  Administered 2018-07-07: 2 [IU] via SUBCUTANEOUS
  Administered 2018-07-08: 8 [IU] via SUBCUTANEOUS
  Administered 2018-07-08: 3 [IU] via SUBCUTANEOUS

## 2018-07-05 MED ORDER — ACETAMINOPHEN 650 MG RE SUPP: 650.0000 mg | Freq: Four times a day (QID) | RECTAL | Status: DC | PRN

## 2018-07-05 MED ORDER — METRONIDAZOLE IN NACL 5-0.79 MG/ML-% IV SOLN
500.0000 mg | Freq: Three times a day (TID) | INTRAVENOUS | Status: DC
  Administered 2018-07-05 – 2018-07-09 (×11): 500 mg via INTRAVENOUS
  Filled 2018-07-05 (×11): qty 100

## 2018-07-05 MED ORDER — ACETAMINOPHEN 325 MG PO TABS
650.0000 mg | ORAL_TABLET | Freq: Four times a day (QID) | ORAL | Status: DC | PRN
  Administered 2018-07-07: 650 mg via ORAL
  Filled 2018-07-05: qty 2

## 2018-07-05 MED ORDER — ONDANSETRON HCL 4 MG PO TABS
4.0000 mg | ORAL_TABLET | Freq: Four times a day (QID) | ORAL | Status: DC | PRN
  Administered 2018-07-07: 4 mg via ORAL

## 2018-07-05 MED ORDER — SODIUM CHLORIDE 0.9 % IV BOLUS
1000.0000 mL | Freq: Once | INTRAVENOUS | Status: AC
  Administered 2018-07-05: 1000 mL via INTRAVENOUS

## 2018-07-05 MED ORDER — HEPARIN SODIUM (PORCINE) 5000 UNIT/ML IJ SOLN
5000.0000 [IU] | Freq: Three times a day (TID) | INTRAMUSCULAR | Status: DC
  Administered 2018-07-05 – 2018-07-09 (×11): 5000 [IU] via SUBCUTANEOUS
  Filled 2018-07-05 (×11): qty 1

## 2018-07-05 MED ORDER — PIPERACILLIN-TAZOBACTAM 3.375 G IVPB 30 MIN
3.3750 g | Freq: Once | INTRAVENOUS | Status: AC
  Administered 2018-07-05: 3.375 g via INTRAVENOUS
  Filled 2018-07-05: qty 50

## 2018-07-05 MED ORDER — VANCOMYCIN HCL IN DEXTROSE 1-5 GM/200ML-% IV SOLN
1000.0000 mg | Freq: Once | INTRAVENOUS | Status: AC
  Administered 2018-07-05: 1000 mg via INTRAVENOUS
  Filled 2018-07-05: qty 200

## 2018-07-05 MED ORDER — INSULIN GLARGINE 100 UNIT/ML ~~LOC~~ SOLN
30.0000 [IU] | Freq: Every day | SUBCUTANEOUS | Status: DC
  Administered 2018-07-07 – 2018-07-09 (×3): 30 [IU] via SUBCUTANEOUS
  Filled 2018-07-05 (×4): qty 0.3

## 2018-07-05 MED ORDER — CEFTRIAXONE SODIUM 2 G IJ SOLR
2.0000 g | INTRAMUSCULAR | Status: DC
  Administered 2018-07-05 – 2018-07-08 (×4): 2 g via INTRAVENOUS
  Filled 2018-07-05 (×3): qty 2
  Filled 2018-07-05 (×2): qty 20
  Filled 2018-07-05: qty 2

## 2018-07-05 MED ORDER — ONDANSETRON HCL 4 MG/2ML IJ SOLN: 4.0000 mg | Freq: Four times a day (QID) | INTRAMUSCULAR | Status: DC | PRN

## 2018-07-05 MED ORDER — SODIUM CHLORIDE 0.45 % IV SOLN
INTRAVENOUS | Status: DC
  Administered 2018-07-05 – 2018-07-09 (×7): via INTRAVENOUS

## 2018-07-05 NOTE — ED Triage Notes (Signed)
Pt states that he has a wound between his big toe and 2nd toe on his left foot. Pt states that the sore has begun to heal, but he is concerned as he has diabetic neuropathy.

## 2018-07-05 NOTE — ED Provider Notes (Signed)
MSE was initiated and I personally evaluated the patient and placed orders (if any) at  1:18 PM on July 05, 2018.  [name] is a 42 y.o. male who presents to the ED for left foot pain and swelling. Patient reports he has open wounds between the left great toe and the second toe. He can not feel any pain until he walks. He reports having diabetic neuropathy. Patient reports another area to the lateral aspect of the left foot that has been there for a long time but recently the areas has gotten larger and today started draining. Patient has not been checking his blood sugar regularly at home. CBG here 450  BP 122/85 (BP Location: Left Arm)   Pulse 82   Temp 98.4 F (36.9 C) (Oral)   Resp 14   Ht 6\' 1"  (1.854 m)   Wt 97.5 kg   SpO2 99%   BMI 28.37 kg/m    Left foot with swelling, pedal pulse 2+, wounds to the left great and second toes. Wound to the lateral aspect of the left foot that is draining. There is erythema and increased warmth to the dorsum of the left foot.   The patient appears stable so that the remainder of the MSE may be completed by another provider.   Kerrie Buffaloeese, Hope Fort [place]ansom[place], [place]NP 07/05/18 1322    Loren RacerYelverton, David, MD 07/09/18 [phone]

## 2018-07-05 NOTE — H&P (Signed)
History and Physical    [name] TSV:[id number] DOB: 20-Feb-1976 DOA: 07/05/2018  PCP: Patient, No Pcp Per   Patient coming from: Home.  I have personally briefly reviewed patient's old medical records in [place]  Chief Complaint: Left foot wound.  HPI: [name] is a 42 y.o. male with medical history significant of anemia, history of blood, type 2 diabetes who is coming to the emergency department with complaints of progressively worse left foot wound.  He denies fever, but complains of chills, fatigue, malaise and decreased appetite.  He has been using his long-acting insulin, but has not been able to use his NovoLog before meals.  Denies polyuria, polydipsia, polyphagia or blurred vision. Denies headache, sore throat, wheezing, hemoptysis, dyspnea, chest pain, palpitations, dizziness, diaphoresis, PND, orthopnea or pitting edema of the lower extremities.  Denies abdominal pain, nausea, emesis, diarrhea, melena, see patient or hematochezia.  No dysuria, frequency or hematuria.  No heat or cold intolerance.    ED Course: Vital signs temperature 98.4 F, pulse 82, respirations 14, blood pressure 122/85 mmHg and O2 sat 99% on room air.  The patient received vancomycin and Zosyn in the emergency department along with a 1000 mL NS bolus.  His white count was 6.1 with a normal differential, hemoglobin 14.2 g/dL, elevated an elevated MCV at 102.0 and MCH at 35.6.  His platelets are 271.  BMP showed a glucose of 445 mg/dL, but all other values are within normal limits.  His left foot x-ray did not show any acute abnormality.  Please see images and full radiology report for further detail.  Review of Systems: As per HPI otherwise 10 point review of systems negative.   Past Medical History:  Diagnosis Date  . Anemia   . Bleeding    hx internal bleeding 1995 due to accident  . Blood transfusion   . Diabetes mellitus     History reviewed. No pertinent surgical history.   reports that he quit smoking about 22 years ago. He has never used smokeless tobacco. He reports that he drinks alcohol. He reports that he does not use drugs.  No Known Allergies    Prior to Admission medications   Medication Sig Start Date End Date Taking? Authorizing Provider  Aspirin-Acetaminophen-Caffeine (GOODY HEADACHE PO) Take 1 packet by mouth 2 (two) times daily as needed (headache).   Yes [provider]  Aspirin-Caffeine (BC FAST PAIN RELIEF PO) Take 1 packet by mouth 2 (two) times daily as needed (headache).   Yes [provider]  insulin aspart (NOVOLOG) 100 UNIT/ML injection Inject 5 Units into the skin 3 (three) times daily before meals.  04/19/12  Yes Davis, Epimenio Sarin, MD  Insulin Glargine (TOUJEO SOLOSTAR [place]) Inject 30 Units into the skin daily.   Yes [provider]  promethazine (PHENERGAN) 25 MG tablet Take 1 tablet (25 mg total) by mouth every 6 (six) hours as needed for nausea. 07/30/12 08/06/12  Milton Ferguson, MD  promethazine (PHENERGAN) 25 MG tablet Take 1 tablet (25 mg total) by mouth every 6 (six) hours as needed for nausea. 07/30/12 08/06/12  Milton Ferguson, MD  ranitidine (ZANTAC) 150 MG capsule Take 1 capsule (150 mg total) by mouth 2 (two) times daily. 07/30/12 07/30/13  Milton Ferguson, MD  ranitidine (ZANTAC) 150 MG capsule Take 1 capsule (150 mg total) by mouth 2 (two) times daily. 07/30/12 07/30/13  Milton Ferguson, MD    Physical Exam: Vitals:   07/05/18 1131 07/05/18 1133 07/05/18 1441  BP: 122/85    Pulse: 82    Resp: 14    Temp: 98.4 F (36.9 C)  99.3 F (37.4 C)  TempSrc: Oral  Rectal  SpO2: 99%    Weight:  97.5 kg   Height:  _0  (1.854 m)     Constitutional: NAD, calm, comfortable Eyes: PERRL, lids and conjunctivae normal ENMT: Mucous membranes are moist. Posterior pharynx clear of any exudate or lesions. Neck: normal, supple, no masses, no thyromegaly Respiratory: clear to auscultation bilaterally, no wheezing, no crackles.  Normal respiratory effort. No accessory muscle use.  Cardiovascular: Regular rate and rhythm, no murmurs / rubs / gallops. No extremity edema. 2+ pedal pulses. No carotid bruits.  Abdomen: Soft, no tenderness, no masses palpated. No hepatosplenomegaly. Bowel sounds positive.  Musculoskeletal: no clubbing / cyanosis. No joint deformity upper and lower extremities. Good ROM, no contractures. Normal muscle tone.  Skin: Positive onychomycosis on left big toenail.  There is first toe lateral and second toe medial paronychia with surrounding erythema, edema and color.  There is hyperkeratosis of the soles.  There is an ulcerated callus with serosanguineous drainage on the left cc joint area.  There is significant swelling of the left lower extremity, when compared to the right sided lower extremity.  Please see images and full radiology report for further detail. Neurologic: CN 2-12 grossly intact. Sensation intact, DTR normal. Strength 5/5 in all 4.  Psychiatric: Normal judgment and insight. Alert and oriented x 3. Normal mood.             Labs on Admission: I have personally reviewed following labs and imaging studies  CBC: Recent Labs  Lab 07/05/18 1329  WBC 6.1  NEUTROABS 4.1  HGB 14.2  HCT 40.7  MCV 102.0*  PLT 932   Basic Metabolic Panel: Recent Labs  Lab 07/05/18 1329  NA 135  K 4.2  CL 99  CO2 23  GLUCOSE 445*  BUN 8  CREATININE 0.83  CALCIUM 9.7   GFR: Estimated Creatinine Clearance: 141.1 mL/min (by C-G formula based on SCr of 0.83 mg/dL). Liver Function Tests: No results for input(s): AST, ALT, ALKPHOS, BILITOT, PROT, ALBUMIN in the last 168 hours. No results for input(s): LIPASE, AMYLASE in the last 168 hours. No results for input(s): AMMONIA in the last 168 hours. Coagulation Profile: No results for input(s): INR, PROTIME in the last 168 hours. Cardiac Enzymes: No results for input(s): CKTOTAL, CKMB, CKMBINDEX, TROPONINI in the last 168 hours. BNP (last 3  results) No results for input(s): PROBNP in the last 8760 hours. HbA1C: No results for input(s): HGBA1C in the last 72 hours. CBG: Recent Labs  Lab 07/05/18 1303  GLUCAP 480*   Lipid Profile: No results for input(s): CHOL, HDL, LDLCALC, TRIG, CHOLHDL, LDLDIRECT in the last 72 hours. Thyroid Function Tests: No results for input(s): TSH, T4TOTAL, FREET4, T3FREE, THYROIDAB in the last 72 hours. Anemia Panel: No results for input(s): VITAMINB12, FOLATE, FERRITIN, TIBC, IRON, RETICCTPCT in the last 72 hours. Urine analysis:    Component Value Date/Time   COLORURINE AMBER (A) 04/17/2012 1812   APPEARANCEUR CLEAR 04/17/2012 1812   LABSPEC 1.016 04/17/2012 1812   PHURINE 8.0 04/17/2012 1812   GLUCOSEU 100 (A) 04/17/2012 1812   HGBUR NEGATIVE 04/17/2012 1812   BILIRUBINUR NEGATIVE 04/17/2012 1812   KETONESUR NEGATIVE 04/17/2012 1812   PROTEINUR NEGATIVE 04/17/2012 1812   UROBILINOGEN 1.0 04/17/2012 1812   NITRITE NEGATIVE 04/17/2012 1812   LEUKOCYTESUR NEGATIVE 04/17/2012 1812    Radiological  Exams on Admission: Dg Foot Complete Left  Result Date: 07/05/2018 CLINICAL DATA:  Acute LEFT foot pain with wound along 2nd toe. Initial encounter. EXAM: LEFT FOOT - COMPLETE 3+ VIEW COMPARISON:  None. FINDINGS: No evidence of acute bony abnormality including fracture or osteomyelitis. No evidence of subluxation or dislocation. Mild degenerative changes at the 1st MTP joint and within the midfoot noted. No radiopaque foreign bodies identified. IMPRESSION: No acute abnormality. Electronically Signed   By: Margarette Canada M.D.   On: 07/05/2018 14:03    EKG: Independently reviewed.   Assessment/Plan Principal Problem:   Diabetic ulcer of left foot ([place]) Admit to MedSurg/inpatient. Continue local care. Continue IV ceftriaxone and metronidazole. Check ESR, CRP and prealbumin. Check hemoglobin A1c. Consult case management and social services. Consult nutritiona services. Consult wound care  team. Check MRI in a.m.  Active Problems:   Uncontrolled diabetes mellitus (HCC) Carbohydrate modified diet. Continue insulin glargine 30 units SQ daily. CBG monitoring regular insulin sliding scale. Check hemoglobin A1c.    Elevated MCV Check vitamin N86 and folic acid level.   DVT prophylaxis: Heparin SQ. Code Status: Full code. Family Communication: None at bedside. Disposition Plan: Admit for IV antibiotics and further work-up Consults called:  Admission status: Inpatient/telemetry.   Reubin Milan MD [facility] Pager [phone].  If 7PM-7AM, please contact night-coverage [email or web address] Password [email or web address]  07/05/2018, 4:07 PM

## 2018-07-05 NOTE — Progress Notes (Signed)
A consult was received from an ED physician for Vancomycin per pharmacy dosing.  The patient's profile has been reviewed for ht/wt/allergies/indication/available labs.   A one time order has been placed for vancomycin 1000 mg IV.  Further antibiotics/pharmacy consults should be ordered by admitting physician if indicated.                       Thank you, Lynann Beaverhristine Sajan Cheatwood PharmD, BCPS Pager [phone] 07/05/2018 2:41 PM

## 2018-07-05 NOTE — ED Provider Notes (Signed)
[facility]-EMERGENCY DEPT Provider Note   CSN: [id number] Arrival date & time: 07/05/18  1126     History   Chief Complaint Chief Complaint  Patient presents with  . Wound Check    HPI [name] is a 42 y.o. male.  HPI   Patient is a 42 year old male with history of anemia, insulin-dependent T2DM, peripheral neuropathy who presents the emergency department today for a wound check.  Patient states that he has had wounds to his great toe and second toe on the left foot for the last 3 days that seem to be worsening.  He thinks they developed after wearing steel toed boots at work.  Denies any drainage from the area.  He also notes a wound to the lateral aspect of the left foot that has been present for several years however started draining purulent fluid this morning.  Has peripheral neuropathy so he states that his pain is minimal.  Notes that he had chills several days ago but this has resolved.  No documented fevers at home.  He has not been checking his blood sugars at home because he lost his glucometer.  He takes insulin at home and reports that he has been adherent to his medications.  Past Medical History:  Diagnosis Date  . Anemia   . Bleeding    hx internal bleeding 1995 due to accident  . Blood transfusion   . Diabetes mellitus     Patient Active Problem List   Diagnosis Date Noted  . Diabetic ulcer of left foot (HCC) 07/05/2018  . Uncontrolled diabetes mellitus (HCC) 07/05/2018  . Elevated MCV 07/05/2018  . Transaminitis 04/18/2012  . Hyponatremia 04/18/2012  . Anemia 04/18/2012  . Alcohol abuse 04/18/2012  . Hypokalemia 04/18/2012  . Pancytopenia 04/18/2012  . Neuropathy 04/18/2012  . DIABETES MELLITUS, TYPE II 08/16/2010  . DKA 08/16/2010  . PANCREATITIS 08/16/2010    History reviewed. No pertinent surgical history.      Home Medications    Prior to Admission medications   Medication Sig Start Date End Date Taking? Authorizing  Provider  Aspirin-Acetaminophen-Caffeine (GOODY HEADACHE PO) Take 1 packet by mouth 2 (two) times daily as needed (headache).   Yes [provider]  Aspirin-Caffeine (BC FAST PAIN RELIEF PO) Take 1 packet by mouth 2 (two) times daily as needed (headache).   Yes [provider]  insulin aspart (NOVOLOG) 100 UNIT/ML injection Inject 5 Units into the skin 3 (three) times daily before meals.  04/19/12  Yes Davis, Marchelle Folks, MD  Insulin Glargine (TOUJEO SOLOSTAR ) Inject 30 Units into the skin daily.   Yes [provider]  promethazine (PHENERGAN) 25 MG tablet Take 1 tablet (25 mg total) by mouth every 6 (six) hours as needed for nausea. 07/30/12 08/06/12  Bethann Berkshire, MD  promethazine (PHENERGAN) 25 MG tablet Take 1 tablet (25 mg total) by mouth every 6 (six) hours as needed for nausea. 07/30/12 08/06/12  Bethann Berkshire, MD  ranitidine (ZANTAC) 150 MG capsule Take 1 capsule (150 mg total) by mouth 2 (two) times daily. 07/30/12 07/30/13  Bethann Berkshire, MD  ranitidine (ZANTAC) 150 MG capsule Take 1 capsule (150 mg total) by mouth 2 (two) times daily. 07/30/12 07/30/13  Bethann Berkshire, MD    Family History History reviewed. No pertinent family history.  Social History Social History   Tobacco Use  . Smoking status: Former Smoker    Last attempt to quit: 04/17/1996    Years since quitting: 22.2  .  Smokeless tobacco: Never Used  Substance Use Topics  . Alcohol use: Yes    Comment: occ  . Drug use: No     Allergies   Patient has no known allergies.   Review of Systems Review of Systems  Constitutional: Positive for chills. Negative for fever.  HENT: Negative for congestion.   Eyes: Negative for visual disturbance.  Respiratory: Negative for shortness of breath.   Cardiovascular: Negative for chest pain.  Gastrointestinal: Negative for abdominal pain, nausea and vomiting.  Endocrine: Negative for polydipsia and polyuria.  Genitourinary: Negative for flank pain.    Musculoskeletal: Negative for back pain and neck pain.       Mild left foot pain  Skin: Positive for wound.  Neurological: Negative for headaches.   Physical Exam Updated Vital Signs BP 135/85 (BP Location: Right Arm)   Pulse 84   Temp 98.8 F (37.1 C) (Oral)   Resp 14   Ht 6\' 1"  (1.854 m)   Wt 97.5 kg   SpO2 100%   BMI 28.37 kg/m   Physical Exam  Constitutional: He appears well-developed and well-nourished.  HENT:  Head: Normocephalic and atraumatic.  Eyes: Conjunctivae are normal.  Neck: Neck supple.  Cardiovascular: Normal rate, regular rhythm, normal heart sounds and intact distal pulses.  No murmur heard. Pulmonary/Chest: Effort normal and breath sounds normal. No respiratory distress. He has no wheezes.  Abdominal: Soft. Bowel sounds are normal. There is no tenderness.  Musculoskeletal:  Multiple wounds on the left foot pictured below. Wounds to the 2nd or 3rd toes are malodorous. Wound to left lateral foot is drainage serosanguinous drainage. Erythema to the lateral foot extending to the ankle with trace edema.   Neurological: He is alert.  Skin: Skin is warm and dry.  Psychiatric: He has a normal mood and affect.  Nursing note and vitals reviewed.              ED Treatments / Results  Labs (all labs ordered are listed, but only abnormal results are displayed) Labs Reviewed  CBC WITH DIFFERENTIAL/PLATELET - Abnormal; Notable for the following components:      Result Value   RBC 3.99 (*)    MCV 102.0 (*)    MCH 35.6 (*)    All other components within normal limits  BASIC METABOLIC PANEL - Abnormal; Notable for the following components:   Glucose, Bld 445 (*)    All other components within normal limits  CBG MONITORING, ED - Abnormal; Notable for the following components:   Glucose-Capillary 480 (*)    All other components within normal limits  HEMOGLOBIN A1C  SEDIMENTATION RATE  C-REACTIVE PROTEIN  PREALBUMIN    EKG None  Radiology Dg  Foot Complete Left  Result Date: 07/05/2018 CLINICAL DATA:  Acute LEFT foot pain with wound along 2nd toe. Initial encounter. EXAM: LEFT FOOT - COMPLETE 3+ VIEW COMPARISON:  None. FINDINGS: No evidence of acute bony abnormality including fracture or osteomyelitis. No evidence of subluxation or dislocation. Mild degenerative changes at the 1st MTP joint and within the midfoot noted. No radiopaque foreign bodies identified. IMPRESSION: No acute abnormality. Electronically Signed   By: Harmon PierJeffrey  Hu M.D.   On: 07/05/2018 14:03    Procedures Procedures (including critical care time)  Medications Ordered in ED Medications  vancomycin (VANCOCIN) IVPB 1000 mg/200 mL premix (has no administration in time range)  heparin injection 5,000 Units (has no administration in time range)  ondansetron (ZOFRAN) tablet 4 mg (has no administration in time  range)    Or  ondansetron (ZOFRAN) injection 4 mg (has no administration in time range)  acetaminophen (TYLENOL) tablet 650 mg (has no administration in time range)    Or  acetaminophen (TYLENOL) suppository 650 mg (has no administration in time range)  0.45 % sodium chloride infusion (has no administration in time range)  cefTRIAXone (ROCEPHIN) 2 g in sodium chloride 0.9 % 100 mL IVPB (has no administration in time range)    And  metroNIDAZOLE (FLAGYL) IVPB 500 mg (has no administration in time range)  sodium chloride 0.9 % bolus 1,000 mL (0 mLs Intravenous Paused 07/05/18 1516)  piperacillin-tazobactam (ZOSYN) IVPB 3.375 g (0 g Intravenous Stopped 07/05/18 1555)     Initial Impression / Assessment and Plan / ED Course  I have reviewed the triage vital signs and the nursing notes.  Pertinent labs & imaging results that were available during my care of the patient were reviewed by me and considered in my medical decision making (see chart for details).    Discussed pt presentation and exam findings with Dr. Ranae Palms, who agrees with the plan to initiate IV abx  and admit to hospitalist service.   Final Clinical Impressions(s) / ED Diagnoses   Final diagnoses:  Wound infection   Pt with h/o insulin dependant T2DM presenting for multiple wounds to the left foot, some have been present for about 2 days and are malodorous on exam without purulent drainage. an additional wound which has been present for several years to the left lateral foot is also present and drainage serosanguinous fluid. Pt noted purulent drainage from this wound today.    Pt is afebrile here with otherwise stable VS. Appears to have infected wounds to the feet with trace edema and erythema noted to the majority of the left foot. Has swelling to entire foot. Xrays reviewed and showed no obvious evidence of gas or changes suggestive of osteomyelitis.  CBC without leukocytosis. BMP with glucose of 445, no elevated anion gap. Normal bicarb and electrolytes. Pt not in DKA. Pt not septic.   IVF and IV abx initiated for diabetic foot wound infection with surrounding cellulitis.  2:35 PM Consult placed to hospitalist service  CONSULT with Dr. Robb Matar from hospitalist service who will admit the patient for further treatment.   ED Discharge Orders    None       Rayne Du 07/05/18 1648    Loren Racer, MD 07/09/18 [phone]

## 2018-07-05 NOTE — ED Notes (Signed)
CBG 480. Pt stated that he has not checked his blood sugar in one month.

## 2018-07-05 NOTE — ED Notes (Signed)
ED TO INPATIENT HANDOFF REPORT  Name/Age/Gender Delrae Rend 42 y.o. male  Code Status Code Status History    Date Active Date Inactive Code Status Order ID Comments User Context   04/18/2012 0954 04/19/2012 1452 Full Code [id number]  Ernest Haber, MD Inpatient      Home/SNF/Other Home  Chief Complaint wound on left foot   Level of Care/Admitting Diagnosis ED Disposition    ED Disposition Condition [place]: [facility] [100102]  Level of Care: Med-Surg [16]  Diagnosis: Diabetic ulcer of left foot [facility]) [825053]  Admitting Physician: Reubin Milan [[id number]]  Attending Physician: Reubin Milan [[id number]]  Estimated length of stay: past midnight tomorrow  Certification:: I certify this patient will need inpatient services for at least 2 midnights  PT Class (Do Not Modify): Inpatient [101]  PT Acc Code (Do Not Modify): Private [1]       Medical History Past Medical History:  Diagnosis Date  . Anemia   . Bleeding    hx internal bleeding 1995 due to accident  . Blood transfusion   . Diabetes mellitus     Allergies No Known Allergies  IV Location/Drains/Wounds Patient Lines/Drains/Airways Status   Active Line/Drains/Airways    Name:   Placement date:   Placement time:   Site:   Days:   Peripheral IV 07/05/18 Left Wrist   07/05/18    1507    Wrist   less than 1          Labs/Imaging Results for orders placed or performed during the hospital encounter of 07/05/18 (from the past 48 hour(s))  POC CBG, ED     Status: Abnormal   Collection Time: 07/05/18  1:03 PM  Result Value Ref Range   Glucose-Capillary 480 (H) 70 - 99 mg/dL  CBC with Differential/Platelet     Status: Abnormal   Collection Time: 07/05/18  1:29 PM  Result Value Ref Range   WBC 6.1 4.0 - 10.5 K/uL   RBC 3.99 (L) 4.22 - 5.81 MIL/uL   Hemoglobin 14.2 13.0 - 17.0 g/dL   HCT 40.7 39.0 - 52.0 %   MCV 102.0 (H) 78.0 - 100.0 fL   MCH 35.6 (H)  26.0 - 34.0 pg   MCHC 34.9 30.0 - 36.0 g/dL   RDW 12.4 11.5 - 15.5 %   Platelets 271 150 - 400 K/uL   Neutrophils Relative % 67 %   Neutro Abs 4.1 1.7 - 7.7 K/uL   Lymphocytes Relative 17 %   Lymphs Abs 1.0 0.7 - 4.0 K/uL   Monocytes Relative 16 %   Monocytes Absolute 1.0 0.1 - 1.0 K/uL   Eosinophils Relative 0 %   Eosinophils Absolute 0.0 0.0 - 0.7 K/uL   Basophils Relative 0 %   Basophils Absolute 0.0 0.0 - 0.1 K/uL    Comment: Performed at [facility], [place] [address] [place], [place] [address]  Basic metabolic panel     Status: Abnormal   Collection Time: 07/05/18  1:29 PM  Result Value Ref Range   Sodium 135 135 - 145 mmol/L   Potassium 4.2 3.5 - 5.1 mmol/L   Chloride 99 98 - 111 mmol/L   CO2 23 22 - 32 mmol/L   Glucose, Bld 445 (H) 70 - 99 mg/dL   BUN 8 6 - 20 mg/dL   Creatinine, Ser 0.83 0.61 - 1.24 mg/dL   Calcium 9.7 8.9 - 10.3 mg/dL   GFR calc  non Af Amer >60 >60 mL/min   GFR calc Af Amer >60 >60 mL/min    Comment: (NOTE) The eGFR has been calculated using the CKD EPI equation. This calculation has not been validated in all clinical situations. eGFR's persistently <60 mL/min signify possible Chronic Kidney Disease.    Anion gap 13 5 - 15    Comment: Performed at [facility], [place] [address], [place], [place] [address]   Dg Foot Complete Left  Result Date: 07/05/2018 CLINICAL DATA:  Acute LEFT foot pain with wound along 2nd toe. Initial encounter. EXAM: LEFT FOOT - COMPLETE 3+ VIEW COMPARISON:  None. FINDINGS: No evidence of acute bony abnormality including fracture or osteomyelitis. No evidence of subluxation or dislocation. Mild degenerative changes at the 1st MTP joint and within the midfoot noted. No radiopaque foreign bodies identified. IMPRESSION: No acute abnormality. Electronically Signed   By: Margarette Canada M.D.   On: 07/05/2018 14:03    Pending Labs Unresulted Labs (From admission, onward)   None       Vitals/Pain Today's Vitals   07/05/18 1131 07/05/18 1133 07/05/18 1441  BP: 122/85    Pulse: 82    Resp: 14    Temp: 98.4 F (36.9 C)  99.3 F (37.4 C)  TempSrc: Oral  Rectal  SpO2: 99%    Weight:  97.5 kg   Height:  '6\' 1"'$  (1.854 m)   PainSc:  2      Isolation Precautions No active isolations  Medications Medications  sodium chloride 0.9 % bolus 1,000 mL (0 mLs Intravenous Paused 07/05/18 1516)  piperacillin-tazobactam (ZOSYN) IVPB 3.375 g (3.375 g Intravenous New Bag/Given 07/05/18 1512)  vancomycin (VANCOCIN) IVPB 1000 mg/200 mL premix (has no administration in time range)    Mobility walks with person assist

## 2018-07-06 ENCOUNTER — Inpatient Hospital Stay (HOSPITAL_COMMUNITY): Payer: Self-pay

## 2018-07-06 DIAGNOSIS — E538 Deficiency of other specified B group vitamins: Secondary | ICD-10-CM

## 2018-07-06 DIAGNOSIS — I96 Gangrene, not elsewhere classified: Secondary | ICD-10-CM

## 2018-07-06 DIAGNOSIS — E11621 Type 2 diabetes mellitus with foot ulcer: Principal | ICD-10-CM

## 2018-07-06 DIAGNOSIS — E118 Type 2 diabetes mellitus with unspecified complications: Secondary | ICD-10-CM

## 2018-07-06 DIAGNOSIS — I739 Peripheral vascular disease, unspecified: Secondary | ICD-10-CM

## 2018-07-06 DIAGNOSIS — L97526 Non-pressure chronic ulcer of other part of left foot with bone involvement without evidence of necrosis: Secondary | ICD-10-CM

## 2018-07-06 DIAGNOSIS — R718 Other abnormality of red blood cells: Secondary | ICD-10-CM

## 2018-07-06 DIAGNOSIS — L0390 Cellulitis, unspecified: Secondary | ICD-10-CM

## 2018-07-06 LAB — COMPREHENSIVE METABOLIC PANEL
ALBUMIN: 3.3 g/dL — AB (ref 3.5–5.0)
ALT: 24 U/L (ref 0–44)
ANION GAP: 7 (ref 5–15)
AST: 22 U/L (ref 15–41)
Alkaline Phosphatase: 70 U/L (ref 38–126)
BILIRUBIN TOTAL: 0.9 mg/dL (ref 0.3–1.2)
BUN: 7 mg/dL (ref 6–20)
CHLORIDE: 104 mmol/L (ref 98–111)
CO2: 28 mmol/L (ref 22–32)
Calcium: 9.1 mg/dL (ref 8.9–10.3)
Creatinine, Ser: 0.62 mg/dL (ref 0.61–1.24)
GFR calc Af Amer: >60 mL/min (ref >60)
GFR calc non Af Amer: >60 mL/min (ref >60)
GLUCOSE: 64 mg/dL — AB (ref 70–99)
POTASSIUM: 3.4 mmol/L — AB (ref 3.5–5.1)
SODIUM: 139 mmol/L (ref 135–145)
TOTAL PROTEIN: 7.5 g/dL (ref 6.5–8.1)

## 2018-07-06 LAB — CBC WITH DIFFERENTIAL/PLATELET
BASOS ABS: 0 10*3/uL (ref 0.0–0.1)
BASOS PCT: 0 %
Eosinophils Absolute: 0.1 10*3/uL (ref 0.0–0.7)
Eosinophils Relative: 1 %
HEMATOCRIT: 36.6 % — AB (ref 39.0–52.0)
Hemoglobin: 12.6 g/dL — ABNORMAL LOW (ref 13.0–17.0)
Lymphocytes Relative: 18 %
Lymphs Abs: 0.8 10*3/uL (ref 0.7–4.0)
MCH: 35.2 pg — ABNORMAL HIGH (ref 26.0–34.0)
MCHC: 34.4 g/dL (ref 30.0–36.0)
MCV: 102.2 fL — ABNORMAL HIGH (ref 78.0–100.0)
MONO ABS: 1 10*3/uL (ref 0.1–1.0)
MONOS PCT: 21 %
NEUTROS ABS: 2.6 10*3/uL (ref 1.7–7.7)
Neutrophils Relative %: 60 %
PLATELETS: 249 10*3/uL (ref 150–400)
RBC: 3.58 MIL/uL — ABNORMAL LOW (ref 4.22–5.81)
RDW: 12.6 % (ref 11.5–15.5)
WBC: 4.5 10*3/uL (ref 4.0–10.5)

## 2018-07-06 LAB — FOLATE: FOLATE: 13.8 ng/mL (ref >5.9)

## 2018-07-06 LAB — VITAMIN B12: Vitamin B-12: 129 pg/mL — ABNORMAL LOW (ref 180–914)

## 2018-07-06 LAB — GLUCOSE, CAPILLARY
GLUCOSE-CAPILLARY: 229 mg/dL — AB (ref 70–99)
GLUCOSE-CAPILLARY: 130 mg/dL — AB (ref 70–99)
GLUCOSE-CAPILLARY: 56 mg/dL — AB (ref 70–99)
GLUCOSE-CAPILLARY: 242 mg/dL — AB (ref 70–99)
Glucose-Capillary: 229 mg/dL — ABNORMAL HIGH (ref 70–99)
Glucose-Capillary: 145 mg/dL — ABNORMAL HIGH (ref 70–99)
Glucose-Capillary: 202 mg/dL — ABNORMAL HIGH (ref 70–99)

## 2018-07-06 MED ORDER — POTASSIUM CHLORIDE CRYS ER 20 MEQ PO TBCR
40.0000 meq | EXTENDED_RELEASE_TABLET | Freq: Once | ORAL | Status: AC
  Administered 2018-07-06: 40 meq via ORAL
  Filled 2018-07-06: qty 2

## 2018-07-06 MED ORDER — CYANOCOBALAMIN 1000 MCG/ML IJ SOLN
1000.0000 ug | Freq: Once | INTRAMUSCULAR | Status: AC
  Administered 2018-07-06: 1000 ug via INTRAMUSCULAR
  Filled 2018-07-06: qty 1

## 2018-07-06 NOTE — Progress Notes (Signed)
Inpatient Diabetes Program Recommendations  AACE/ADA: New Consensus Statement on Inpatient Glycemic Control (2015)  Target Ranges:  Prepandial:   less than 140 mg/dL      Peak postprandial:   less than 180 mg/dL (1-2 hours)      Critically ill patients:  140 - 180 mg/dL   Lab Results  Component Value Date   GLUCAP 130 (H) 07/06/2018   HGBA1C 11.6 (H) 07/05/2018    Review of Glycemic Control  Diabetes history: DM2 Outpatient Diabetes medications: Toujeo 30 units QD, Novolog 5 units tidwc Current orders for Inpatient glycemic control: Lantus 30 units QD, Novolog 0-15 units tidwc  Hypo this am. HgbA1C of 11.6% indicates poor glycemic control PTA. Lantus on Hold this am with hypoglycemia per MD.  Inpatient Diabetes Program Recommendations:     Decrease Lantus to 20 units QD Decrease Novolog to 0-9 units tidwc  Will speak with pt later on today.   Thank you. [name], RD, LDN, CDE Inpatient Diabetes Coordinator [phone]

## 2018-07-06 NOTE — Consult Note (Signed)
WOC Nurse wound consult note After reviewing the record, in particular the note from Dr. Robb Matarrtiz which included photos, my recommendation is to ask surgery to evaluate the patient's foot.  This diabetic patient complains of pain to the foot to the point that ambulation is difficult, and the photos indicate the toe tips are blackened, other diagnostic tests may prove helpful--such as a blood flow study to the foot.  The drainage shown from the photo to the left foot also could benefit from sharp debridement to the extent that exceeds the [facility]. I have paged Dr. Catha GosselinMikhail to discuss my concerns. Helmut MusterSherry Bonnell Placzek, RN, MSN, CWOCN, CNS-BC, pager [phone]

## 2018-07-06 NOTE — Care Management Note (Signed)
Case Management Note  Patient Details  Name: [name] MRN: [id number] Date of Birth: February 22, 1976  Subjective/Objective:   Pt admitted with Diabetic Ulcer.                 Action/Plan: Pt plan to discharge back home when stable. [facility] at 9:20 AM. [facility] also [facility]. Pt may need [place] at discharge, [place].   Expected Discharge Date:  (unknown)               Expected Discharge Plan:  Home/Self Care  In-House Referral:     Discharge planning Services  CM Consult, Follow-up appt scheduled, [facility]  Post Acute Care Choice:    Choice offered to:     DME Arranged:    DME Agency:     HH Arranged:    HH Agency:     Status of Service:  In process, will continue to follow  If discussed at Long Length of Stay Meetings, dates discussed:    Additional CommentsGeni Bers:  Dreshawn Hendershott, RN 07/06/2018, 2:06 PM

## 2018-07-06 NOTE — Progress Notes (Signed)
CSW consult-patient needs assistance with medications and Insurance. CSW informed RNCM.   Vivi BarrackNicole Baneza Bartoszek, Alexander MtLCSW, MSW Clinical Social Worker  [phone] 07/06/2018  8:58 AM

## 2018-07-06 NOTE — Progress Notes (Signed)
[facility] at 9:20 AM. Pt is aware.

## 2018-07-06 NOTE — Progress Notes (Signed)
PROGRESS NOTE    [name]  ZOX:[id number] DOB: 1976/06/23 DOA: 07/05/2018 PCP: Patient, No Pcp Per   Brief Narrative:  HPI on 07/05/2018 by Dr. Sanda Klein [name] is a 42 y.o. male with medical history significant of anemia, history of blood, type 2 diabetes who is coming to the emergency department with complaints of progressively worse left foot wound.  He denies fever, but complains of chills, fatigue, malaise and decreased appetite.  He has been using his long-acting insulin, but has not been able to use his NovoLog before meals.  Denies polyuria, polydipsia, polyphagia or blurred vision. Denies headache, sore throat, wheezing, hemoptysis, dyspnea, chest pain, palpitations, dizziness, diaphoresis, PND, orthopnea or pitting edema of the lower extremities.  Denies abdominal pain, nausea, emesis, diarrhea, melena, see patient or hematochezia.  No dysuria, frequency or hematuria.  No heat or cold intolerance.   Assessment & Plan   Left diabetic foot ulcer -Left foot x-ray showed no acute abnormality -Wound care consulted and appreciated -MRI of the left foot pending -ABI Right 1.29, Lef t1.27 -Continue ceftriaxone, Flagyl -Continue pain control  Diabetes mellitus, type II complicated by hypoglycemia -Will hold Lantus -Continue insulin sliding scale CBG monitoring -Hemoglobin A1c 11.6  Hypokalemia -Will replace and continue to monitor BMP  Vitamin B12 deficiency -Vitamin B12 level 129, will supplement with injection  DVT Prophylaxis  heparin  Code Status: Full  Family Communication: None at bedside  Disposition Plan: Admitted. Pending MRI. Will likely need ortho consult.  Consultants none  Procedures  ABI  Antibiotics   Anti-infectives (From admission, onward)   Start     Dose/Rate Route Frequency Ordered Stop   07/05/18 2200  cefTRIAXone (ROCEPHIN) 2 g in sodium chloride 0.9 % 100 mL IVPB     2 g 200 mL/hr over 30 Minutes Intravenous Every 24 hours 07/05/18 1604      07/05/18 2200  metroNIDAZOLE (FLAGYL) IVPB 500 mg     500 mg 100 mL/hr over 60 Minutes Intravenous Every 8 hours 07/05/18 1604     07/05/18 1445  piperacillin-tazobactam (ZOSYN) IVPB 3.375 g     3.375 g 100 mL/hr over 30 Minutes Intravenous  Once 07/05/18 1433 07/05/18 1555   07/05/18 1445  vancomycin (VANCOCIN) IVPB 1000 mg/200 mL premix     1,000 mg 200 mL/hr over 60 Minutes Intravenous  Once 07/05/18 1444 07/05/18 1834      Subjective:   [name] seen and examined today.  Pain of some left foot pain.  Denies current chest pain, shortness breath, abdominal pain, nausea or vomiting, diarrhea or constipation, dizziness or headache.  Objective:   Vitals:   07/05/18 1441 07/05/18 1612 07/05/18 2149 07/06/18 0623  BP:  135/85 138/75 131/85  Pulse:  84 81 70  Resp:  14    Temp: 99.3 F (37.4 C) 98.8 F (37.1 C) 99.9 F (37.7 C) 98.8 F (37.1 C)  TempSrc: Rectal Oral Oral Oral  SpO2:  100% 100% 100%  Weight:      Height:        Intake/Output Summary (Last 24 hours) at 07/06/2018 1245 Last data filed at 07/06/2018 0955 Gross per 24 hour  Intake 3361.7 ml  Output 1100 ml  Net 2261.7 ml   Filed Weights   07/05/18 1133  Weight: 97.5 kg    Exam  General: Well developed, well nourished, NAD, appears stated age  HEENT: NCAT,  mucous membranes moist.   Neck: Supple  Cardiovascular: S1 S2 auscultated, no rubs, murmurs or  gallops. Regular rate and rhythm.  Respiratory: Clear to auscultation bilaterally with equal chest rise  Abdomen: Soft, obese, nontender, nondistended, + bowel sounds  Extremities: warm dry without cyanosis clubbing or edema. Left foot ucler, blackened great toe tip  Neuro: AAOx3, nonfocal  Skin: Without rashes exudates or nodules  Psych: Normal affect and demeanor with intact judgement and insight   Data Reviewed: I have personally reviewed following labs and imaging studies  CBC: Recent Labs  Lab 07/05/18 1329 07/06/18 0531  WBC 6.1  4.5  NEUTROABS 4.1 2.6  HGB 14.2 12.6*  HCT 40.7 36.6*  MCV 102.0* 102.2*  PLT 271 249   Basic Metabolic Panel: Recent Labs  Lab 07/05/18 1329 07/06/18 0531  NA 135 139  K 4.2 3.4*  CL 99 104  CO2 23 28  GLUCOSE 445* 64*  BUN 8 7  CREATININE 0.83 0.62  CALCIUM 9.7 9.1   GFR: Estimated Creatinine Clearance: 146.3 mL/min (by C-G formula based on SCr of 0.62 mg/dL). Liver Function Tests: Recent Labs  Lab 07/06/18 0531  AST 22  ALT 24  ALKPHOS 70  BILITOT 0.9  PROT 7.5  ALBUMIN 3.3*   No results for input(s): LIPASE, AMYLASE in the last 168 hours. No results for input(s): AMMONIA in the last 168 hours. Coagulation Profile: No results for input(s): INR, PROTIME in the last 168 hours. Cardiac Enzymes: No results for input(s): CKTOTAL, CKMB, CKMBINDEX, TROPONINI in the last 168 hours. BNP (last 3 results) No results for input(s): PROBNP in the last 8760 hours. HbA1C: Recent Labs    07/05/18 1628  HGBA1C 11.6*   CBG: Recent Labs  Lab 07/05/18 2150 07/05/18 2252 07/06/18 0732 07/06/18 0757 07/06/18 1130  GLUCAP 65* 124* 56* 130* 229*   Lipid Profile: No results for input(s): CHOL, HDL, LDLCALC, TRIG, CHOLHDL, LDLDIRECT in the last 72 hours. Thyroid Function Tests: No results for input(s): TSH, T4TOTAL, FREET4, T3FREE, THYROIDAB in the last 72 hours. Anemia Panel: Recent Labs    07/06/18 0531  VITAMINB12 129*  FOLATE 13.8   Urine analysis:    Component Value Date/Time   COLORURINE AMBER (A) 04/17/2012 1812   APPEARANCEUR CLEAR 04/17/2012 1812   LABSPEC 1.016 04/17/2012 1812   PHURINE 8.0 04/17/2012 1812   GLUCOSEU 100 (A) 04/17/2012 1812   HGBUR NEGATIVE 04/17/2012 1812   BILIRUBINUR NEGATIVE 04/17/2012 1812   KETONESUR NEGATIVE 04/17/2012 1812   PROTEINUR NEGATIVE 04/17/2012 1812   UROBILINOGEN 1.0 04/17/2012 1812   NITRITE NEGATIVE 04/17/2012 1812   LEUKOCYTESUR NEGATIVE 04/17/2012 1812   Sepsis  Labs: @LABRCNTIP (procalcitonin:4,lacticidven:4)  )No results found for this or any previous visit (from the past 240 hour(s)).    Radiology Studies: Dg Foot Complete Left  Result Date: 07/05/2018 CLINICAL DATA:  Acute LEFT foot pain with wound along 2nd toe. Initial encounter. EXAM: LEFT FOOT - COMPLETE 3+ VIEW COMPARISON:  None. FINDINGS: No evidence of acute bony abnormality including fracture or osteomyelitis. No evidence of subluxation or dislocation. Mild degenerative changes at the 1st MTP joint and within the midfoot noted. No radiopaque foreign bodies identified. IMPRESSION: No acute abnormality. Electronically Signed   By: Harmon Pier M.D.   On: 07/05/2018 14:03     Scheduled Meds: . heparin  5,000 Units Subcutaneous Q8H  . insulin aspart  0-15 Units Subcutaneous TID WC  . insulin glargine  30 Units Subcutaneous Daily   Continuous Infusions: . sodium chloride 125 mL/hr at 07/06/18 1235  . cefTRIAXone (ROCEPHIN)  IV Stopped (07/05/18 2144)  And  . metronidazole 500 mg (07/06/18 0531)     LOS: 1 day   Time Spent in minutes   30 minutes  Prisha Hiley D.O. on 07/06/2018 at 12:45 PM  Between 7am to 7pm - Pager - [phone]  After 7pm go to [email or web address] - password [email or web address]  And look for the night coverage person covering for me after hours  [facility] Office  [phone]

## 2018-07-06 NOTE — Progress Notes (Signed)
Inpatient Diabetes Program Recommendations  AACE/ADA: New Consensus Statement on Inpatient Glycemic Control (2015)  Target Ranges:  Prepandial:   less than 140 mg/dL      Peak postprandial:   less than 180 mg/dL (1-2 hours)      Critically ill patients:  140 - 180 mg/dL   Lab Results  Component Value Date   GLUCAP 145 (H) 07/06/2018   HGBA1C 11.6 (H) 07/05/2018    Review of Glycemic Control  Spoke with pt regarding his diabetes. States he takes Toujeo 30 units in am and Novolog 5 units tidwc. Gets his insulin from "a program where he previously lived." States he has several Toujeo pens and Novolog flexpens at home. Pt states HgbA1C is high because he forgets to take Novolog to work. Very interested in controlling blood sugars. States he doesn't want any other complications.  Needs prescription for glucose meter and strips.  Does not need insulin prescriptions at discharge.  Has PCP appt on August 19 at 9:20 am.  Thank you. Ailene Ardshonda Jiovanny Burdell, RD, LDN, CDE Inpatient Diabetes Coordinator [phone]

## 2018-07-06 NOTE — Progress Notes (Signed)
ABI's have been completed. Right 1.29 Left 1.27  07/06/18 9:21 AM Olen CordialGreg Jacques Willingham RVT

## 2018-07-06 NOTE — Progress Notes (Signed)
Nutrition Brief Note  Patient identified for consult for wound healing.   Wt Readings from Last 15 Encounters:  07/05/18 97.5 kg  04/18/12 70.2 kg    Body mass index is 28.37 kg/m. Patient meets criteria for overweight based on current BMI. Flow sheet indicates DM foot ulcers to L great toe and L second toe. WOC note this AM states WOC will sign-off/not follow patient.   Current diet order is Heart Healthy/Carb Modified with 100% intake of breakfast this AM (530 kcal, 24 grams of protein). Patient reports good appetite PTA with no N/V with intakes now or PTA, no abdominal pain now or PTA.   Patient does not consistently use insulin at home, especially insulin prior to meal. Labs and medications reviewed. DM Coordinator saw patient this AM and note states HgbA1c of 11.6%.   No nutrition interventions warranted at this time. If nutrition issues arise, please re-consult RD.     Dylan GammonJessica Takeela Peil, MS, RD, LDN, [facility] Inpatient Clinical Dietitian Pager # [phone] After hours/weekend pager # [phone]

## 2018-07-06 NOTE — Consult Note (Signed)
WOC Nurse wound consult note I spoke via phone with Dr. Catha GosselinMikhail.  She has placed additional diagnostic study orders, and will pursue a surgical consult as indicated. Thank you for the consult.  Discussed plan of care with the patient and bedside nurse.  WOC nurse will not follow at this time.  Please re-consult the WOC team if needed.  Helmut MusterSherry Linn Goetze, RN, MSN, CWOCN, CNS-BC, pager [phone]

## 2018-07-07 ENCOUNTER — Inpatient Hospital Stay (HOSPITAL_COMMUNITY): Payer: Self-pay

## 2018-07-07 LAB — CBC WITH DIFFERENTIAL/PLATELET
BASOS ABS: 0 10*3/uL (ref 0.0–0.1)
Basophils Relative: 0 %
EOS PCT: 2 %
Eosinophils Absolute: 0.1 10*3/uL (ref 0.0–0.7)
HEMATOCRIT: 37.9 % — AB (ref 39.0–52.0)
Hemoglobin: 12.9 g/dL — ABNORMAL LOW (ref 13.0–17.0)
LYMPHS PCT: 31 %
Lymphs Abs: 0.9 10*3/uL (ref 0.7–4.0)
MCH: 35.2 pg — ABNORMAL HIGH (ref 26.0–34.0)
MCHC: 34 g/dL (ref 30.0–36.0)
MCV: 103.6 fL — AB (ref 78.0–100.0)
MONO ABS: 0.6 10*3/uL (ref 0.1–1.0)
Monocytes Relative: 23 %
NEUTROS ABS: 1.2 10*3/uL — AB (ref 1.7–7.7)
Neutrophils Relative %: 44 %
Platelets: 239 10*3/uL (ref 150–400)
RBC: 3.66 MIL/uL — AB (ref 4.22–5.81)
RDW: 12.7 % (ref 11.5–15.5)
WBC: 2.8 10*3/uL — ABNORMAL LOW (ref 4.0–10.5)

## 2018-07-07 LAB — BASIC METABOLIC PANEL
ANION GAP: 7 (ref 5–15)
BUN: 6 mg/dL (ref 6–20)
CO2: 23 mmol/L (ref 22–32)
Calcium: 9.1 mg/dL (ref 8.9–10.3)
Chloride: 105 mmol/L (ref 98–111)
Creatinine, Ser: 0.7 mg/dL (ref 0.61–1.24)
GFR calc Af Amer: >60 mL/min (ref >60)
GFR calc non Af Amer: >60 mL/min (ref >60)
GLUCOSE: 152 mg/dL — AB (ref 70–99)
POTASSIUM: 4.1 mmol/L (ref 3.5–5.1)
Sodium: 135 mmol/L (ref 135–145)

## 2018-07-07 LAB — GLUCOSE, CAPILLARY
GLUCOSE-CAPILLARY: 218 mg/dL — AB (ref 70–99)
GLUCOSE-CAPILLARY: 112 mg/dL — AB (ref 70–99)
Glucose-Capillary: 145 mg/dL — ABNORMAL HIGH (ref 70–99)
Glucose-Capillary: 215 mg/dL — ABNORMAL HIGH (ref 70–99)

## 2018-07-07 MED ORDER — GADOBENATE DIMEGLUMINE 529 MG/ML IV SOLN
20.0000 mL | Freq: Once | INTRAVENOUS | Status: AC | PRN
  Administered 2018-07-07: 20 mL via INTRAVENOUS

## 2018-07-07 MED ORDER — CYANOCOBALAMIN 1000 MCG/ML IJ SOLN
1000.0000 ug | Freq: Once | INTRAMUSCULAR | Status: AC
  Administered 2018-07-07: 1000 ug via INTRAMUSCULAR
  Filled 2018-07-07: qty 1

## 2018-07-07 NOTE — Progress Notes (Signed)
Pt returned to room from MRI. Patient is a/o and in stable condition; no s/s of distress. Family at bedside

## 2018-07-07 NOTE — Progress Notes (Signed)
Patient transported in wheel chair to MRI

## 2018-07-07 NOTE — Progress Notes (Signed)
PROGRESS NOTE    [name]  UJW:[id number] DOB: Nov 11, 1976 DOA: 07/05/2018 PCP: Patient, No Pcp Per   Brief Narrative:  HPI on 07/05/2018 by Dr. Sanda Klein [name] is a 42 y.o. male with medical history significant of anemia, history of blood, type 2 diabetes who is coming to the emergency department with complaints of progressively worse left foot wound.  He denies fever, but complains of chills, fatigue, malaise and decreased appetite.  He has been using his long-acting insulin, but has not been able to use his NovoLog before meals.  Denies polyuria, polydipsia, polyphagia or blurred vision. Denies headache, sore throat, wheezing, hemoptysis, dyspnea, chest pain, palpitations, dizziness, diaphoresis, PND, orthopnea or pitting edema of the lower extremities.  Denies abdominal pain, nausea, emesis, diarrhea, melena, see patient or hematochezia.  No dysuria, frequency or hematuria.  No heat or cold intolerance.    Interim history Admitted for left foot ulcer. Pending MRI. Will likely need ortho consult. 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 Pending MRI. Will likely need ortho consult.  Consultants none  Procedures  ABI  Antibiotics   Anti-infectives (From admission, onward)   Start     Dose/Rate Route Frequency Ordered Stop   07/05/18 2200  cefTRIAXone (ROCEPHIN) 2 g in sodium chloride 0.9 % 100 mL IVPB     2 g 200 mL/hr over 30 Minutes Intravenous Every 24 hours 07/05/18 1604     07/05/18 2200  metroNIDAZOLE (FLAGYL) IVPB 500 mg     500 mg 100 mL/hr over 60 Minutes Intravenous Every 8 hours 07/05/18 1604     07/05/18 1445  piperacillin-tazobactam (ZOSYN) IVPB 3.375 g     3.375 g 100 mL/hr over 30 Minutes Intravenous  Once 07/05/18 1433 07/05/18 1555   07/05/18 1445  vancomycin (VANCOCIN) IVPB 1000 mg/200 mL premix     1,000 mg 200 mL/hr over 60 Minutes Intravenous  Once 07/05/18 1444 07/05/18 1834      Subjective:   [name] seen and examined today.  He is to have some left foot pain.  Also complaining of some nausea related to he thinks are his antibiotics.  Denies current chest pain, shortness of breath, abdominal pain, dizziness or headache. Objective:   Vitals:   07/06/18 1337 07/06/18 1408 07/06/18 2108 07/07/18 0448  BP: (!) 146/86 (!) 134/92 132/89 (!) 140/93  Pulse: (!) 57 73 66 (!) 55  Resp: 14 18 18 20   Temp:  98 F (36.7 C) 99.1 F (37.3 C) 97.8 F (36.6 C)  TempSrc:  Oral Oral Oral  SpO2: 100% 100% 100% 100%  Weight:      Height:        Intake/Output Summary (Last 24 hours) at 07/07/2018 0958 Last data filed at 07/07/2018 0502 Gross per 24 hour  Intake 1893.48 ml  Output 1400 ml  Net 493.48 ml   Filed Weights   07/05/18 1133  Weight: 97.5 kg   Exam  General: Well developed, well nourished, NAD, appears stated age  HEENT: NCAT, mucous membranes moist.   Neck: Supple  Cardiovascular: S1 S2 auscultated, RRR, no murmur  Respiratory: Clear to auscultation bilaterally with equal chest rise  Abdomen: Soft, nontender, nondistended, + bowel sounds  Extremities: warm  dry without cyanosis clubbing or edema.  Left foot ulcer with black and toe tips  Neuro: AAOx3, nonfocal  Psych: appropriate mood and affect  Data Reviewed: I have personally reviewed following labs and imaging studies  CBC: Recent Labs  Lab 07/05/18 1329 07/06/18 0531 07/07/18 0557  WBC 6.1 4.5 2.8*  NEUTROABS 4.1 2.6 1.2*  HGB 14.2 12.6* 12.9*  HCT 40.7 36.6* 37.9*  MCV 102.0* 102.2* 103.6*  PLT 271 249 239   Basic Metabolic Panel: Recent Labs  Lab 07/05/18 1329 07/06/18 0531 07/07/18 0557  NA 135 139 135  K 4.2 3.4* 4.1  CL 99 104 105  CO2 23 28 23   GLUCOSE 445* 64* 152*  BUN 8 7 6   CREATININE 0.83 0.62 0.70  CALCIUM 9.7 9.1 9.1   GFR: Estimated Creatinine Clearance: 146.3 mL/min (by C-G formula based on SCr of 0.7 mg/dL). Liver Function Tests: Recent Labs  Lab 07/06/18 0531  AST 22  ALT 24  ALKPHOS 70  BILITOT 0.9  PROT 7.5  ALBUMIN 3.3*   No results for input(s): LIPASE, AMYLASE in the last 168 hours. No results for input(s): AMMONIA in the last 168 hours. Coagulation Profile: No results for input(s): INR, PROTIME in the last 168 hours. Cardiac Enzymes: No results for input(s): CKTOTAL, CKMB, CKMBINDEX, TROPONINI in the last 168 hours. BNP (last 3 results) No results for input(s): PROBNP in the last 8760 hours. HbA1C: Recent Labs    07/05/18 1628  HGBA1C 11.6*   CBG: Recent Labs  Lab 07/06/18 1130 07/06/18 1305 07/06/18 1628 07/06/18 2109 07/07/18 0733  GLUCAP 229* 242* 145* 202* 145*   Lipid Profile: No results for input(s): CHOL, HDL, LDLCALC, TRIG, CHOLHDL, LDLDIRECT in the last 72 hours. Thyroid Function Tests: No results for input(s): TSH, T4TOTAL, FREET4, T3FREE, THYROIDAB in the last 72 hours. Anemia Panel: Recent Labs    07/06/18 0531  VITAMINB12 129*  FOLATE 13.8   Urine analysis:    Component Value Date/Time   COLORURINE AMBER (A) 04/17/2012 1812   APPEARANCEUR CLEAR 04/17/2012 1812   LABSPEC 1.016 04/17/2012 1812    PHURINE 8.0 04/17/2012 1812   GLUCOSEU 100 (A) 04/17/2012 1812   HGBUR NEGATIVE 04/17/2012 1812   BILIRUBINUR NEGATIVE 04/17/2012 1812   KETONESUR NEGATIVE 04/17/2012 1812   PROTEINUR NEGATIVE 04/17/2012 1812   UROBILINOGEN 1.0 04/17/2012 1812   NITRITE NEGATIVE 04/17/2012 1812   LEUKOCYTESUR NEGATIVE 04/17/2012 1812   Sepsis Labs: @LABRCNTIP (procalcitonin:4,lacticidven:4)  )No results found for this or any previous visit (from the past 240 hour(s)).    Radiology Studies: Dg Foot Complete Left  Result Date: 07/05/2018 CLINICAL DATA:  Acute LEFT foot pain with wound along 2nd toe. Initial encounter. EXAM: LEFT FOOT - COMPLETE 3+ VIEW COMPARISON:  None. FINDINGS: No evidence of acute bony abnormality including fracture or osteomyelitis. No evidence of subluxation or dislocation. Mild degenerative changes at the 1st MTP joint and within the midfoot noted. No radiopaque foreign bodies identified. IMPRESSION: No acute abnormality. Electronically Signed   By: Harmon PierJeffrey  Hu M.D.   On: 07/05/2018 14:03  Scheduled Meds: . heparin  5,000 Units Subcutaneous Q8H  . insulin aspart  0-15 Units Subcutaneous TID WC  . insulin glargine  30 Units Subcutaneous Daily   Continuous Infusions: . sodium chloride 125 mL/hr at 07/06/18 2127  . cefTRIAXone (ROCEPHIN)  IV 2 g (07/06/18 2322)   And  . metronidazole 500 mg (07/07/18 1610)     LOS: 2 days   Time Spent in minutes   30 minutes  Tashanna Dolin D.O. on 07/07/2018 at 9:58 AM  Between 7am to 7pm - Pager - [phone]  After 7pm go to [email or web address] - password [email or web address]  And look for the night coverage person covering for me after hours  [facility] Office  [phone]

## 2018-07-08 LAB — BASIC METABOLIC PANEL
ANION GAP: 8 (ref 5–15)
BUN: 7 mg/dL (ref 6–20)
CALCIUM: 9.1 mg/dL (ref 8.9–10.3)
CO2: 25 mmol/L (ref 22–32)
Chloride: 106 mmol/L (ref 98–111)
Creatinine, Ser: 0.63 mg/dL (ref 0.61–1.24)
GFR calc non Af Amer: >60 mL/min (ref >60)
Glucose, Bld: 85 mg/dL (ref 70–99)
Potassium: 3.9 mmol/L (ref 3.5–5.1)
SODIUM: 139 mmol/L (ref 135–145)

## 2018-07-08 LAB — GLUCOSE, CAPILLARY
GLUCOSE-CAPILLARY: 95 mg/dL (ref 70–99)
GLUCOSE-CAPILLARY: 262 mg/dL — AB (ref 70–99)
Glucose-Capillary: 151 mg/dL — ABNORMAL HIGH (ref 70–99)
Glucose-Capillary: 76 mg/dL (ref 70–99)

## 2018-07-08 LAB — CBC
HCT: 35.9 % — ABNORMAL LOW (ref 39.0–52.0)
HEMOGLOBIN: 12.2 g/dL — AB (ref 13.0–17.0)
MCH: 35 pg — ABNORMAL HIGH (ref 26.0–34.0)
MCHC: 34 g/dL (ref 30.0–36.0)
MCV: 102.9 fL — ABNORMAL HIGH (ref 78.0–100.0)
Platelets: 244 10*3/uL (ref 150–400)
RBC: 3.49 MIL/uL — ABNORMAL LOW (ref 4.22–5.81)
RDW: 12.5 % (ref 11.5–15.5)
WBC: 2.8 10*3/uL — AB (ref 4.0–10.5)

## 2018-07-08 MED ORDER — CYANOCOBALAMIN 1000 MCG/ML IJ SOLN
1000.0000 ug | Freq: Once | INTRAMUSCULAR | Status: AC
  Administered 2018-07-08: 1000 ug via INTRAMUSCULAR
  Filled 2018-07-08: qty 1

## 2018-07-08 NOTE — Consult Note (Signed)
Reason for Consult: Concern for left foot cellulitis Referring Physician: Hospitalist  [name] is an 42 y.o. male.  HPI: Patient is a 42 year old male with poorly controlled diabetes who presented to the emergency room with swelling in the left foot.  He had seen a little bit of drainage from the foot as well.  He admits to not controlling his blood sugars well as he has lost some of the hardware necessary to monitor his blood sugars.  He comes in for evaluation of this left foot pain.  Past Medical History:  Diagnosis Date  . Anemia   . Bleeding    hx internal bleeding 1995 due to accident  . Blood transfusion   . Diabetes mellitus     Past Surgical History:  Procedure Laterality Date  . EXPLORATORY LAPAROTOMY      Family History  Problem Relation Age of Onset  . Arthritis Mother   . Diabetes Mellitus II Father     Social History:  reports that he quit smoking about 22 years ago. He has never used smokeless tobacco. He reports that he drinks alcohol. He reports that he does not use drugs.  Allergies: No Known Allergies  Medications: I have reviewed the patient's current medications.  Results for orders placed or performed during the hospital encounter of 07/05/18 (from the past 48 hour(s))  Glucose, capillary     Status: Abnormal   Collection Time: 07/06/18  1:05 PM  Result Value Ref Range   Glucose-Capillary 242 (H) 70 - 99 mg/dL  Glucose, capillary     Status: Abnormal   Collection Time: 07/06/18  4:28 PM  Result Value Ref Range   Glucose-Capillary 145 (H) 70 - 99 mg/dL  Glucose, capillary     Status: Abnormal   Collection Time: 07/06/18  9:09 PM  Result Value Ref Range   Glucose-Capillary 202 (H) 70 - 99 mg/dL  CBC WITH DIFFERENTIAL     Status: Abnormal   Collection Time: 07/07/18  5:57 AM  Result Value Ref Range   WBC 2.8 (L) 4.0 - 10.5 K/uL   RBC 3.66 (L) 4.22 - 5.81 MIL/uL   Hemoglobin 12.9 (L) 13.0 - 17.0 g/dL   HCT 37.9 (L) 39.0 - 52.0 %   MCV 103.6  (H) 78.0 - 100.0 fL   MCH 35.2 (H) 26.0 - 34.0 pg   MCHC 34.0 30.0 - 36.0 g/dL   RDW 12.7 11.5 - 15.5 %   Platelets 239 150 - 400 K/uL   Neutrophils Relative % 44 %   Neutro Abs 1.2 (L) 1.7 - 7.7 K/uL   Lymphocytes Relative 31 %   Lymphs Abs 0.9 0.7 - 4.0 K/uL   Monocytes Relative 23 %   Monocytes Absolute 0.6 0.1 - 1.0 K/uL   Eosinophils Relative 2 %   Eosinophils Absolute 0.1 0.0 - 0.7 K/uL   Basophils Relative 0 %   Basophils Absolute 0.0 0.0 - 0.1 K/uL    Comment: Performed at [facility], [place] [address] [place], [place] [address]  Basic metabolic panel     Status: Abnormal   Collection Time: 07/07/18  5:57 AM  Result Value Ref Range   Sodium 135 135 - 145 mmol/L   Potassium 4.1 3.5 - 5.1 mmol/L    Comment: DELTA CHECK NOTED REPEATED TO VERIFY NO VISIBLE HEMOLYSIS    Chloride 105 98 - 111 mmol/L   CO2 23 22 - 32 mmol/L   Glucose, Bld 152 (H) 70 - 99 mg/dL  BUN 6 6 - 20 mg/dL   Creatinine, Ser 0.70 0.61 - 1.24 mg/dL   Calcium 9.1 8.9 - 10.3 mg/dL   GFR calc non Af Amer >60 >60 mL/min   GFR calc Af Amer >60 >60 mL/min    Comment: (NOTE) The eGFR has been calculated using the CKD EPI equation. This calculation has not been validated in all clinical situations. eGFR's persistently <60 mL/min signify possible Chronic Kidney Disease.    Anion gap 7 5 - 15    Comment: Performed at [facility], [place] [address] [place], [place] [address]  Glucose, capillary     Status: Abnormal   Collection Time: 07/07/18  7:33 AM  Result Value Ref Range   Glucose-Capillary 145 (H) 70 - 99 mg/dL  Glucose, capillary     Status: Abnormal   Collection Time: 07/07/18 11:23 AM  Result Value Ref Range   Glucose-Capillary 215 (H) 70 - 99 mg/dL  Glucose, capillary     Status: Abnormal   Collection Time: 07/07/18  5:20 PM  Result Value Ref Range   Glucose-Capillary 218 (H) 70 - 99 mg/dL  Glucose, capillary     Status: Abnormal   Collection Time:  07/07/18  9:34 PM  Result Value Ref Range   Glucose-Capillary 112 (H) 70 - 99 mg/dL  CBC     Status: Abnormal   Collection Time: 07/08/18  4:37 AM  Result Value Ref Range   WBC 2.8 (L) 4.0 - 10.5 K/uL   RBC 3.49 (L) 4.22 - 5.81 MIL/uL   Hemoglobin 12.2 (L) 13.0 - 17.0 g/dL   HCT 35.9 (L) 39.0 - 52.0 %   MCV 102.9 (H) 78.0 - 100.0 fL   MCH 35.0 (H) 26.0 - 34.0 pg   MCHC 34.0 30.0 - 36.0 g/dL   RDW 12.5 11.5 - 15.5 %   Platelets 244 150 - 400 K/uL    Comment: Performed at [facility], [place] [address], [place], [place] [address]  Basic metabolic panel     Status: None   Collection Time: 07/08/18  4:37 AM  Result Value Ref Range   Sodium 139 135 - 145 mmol/L   Potassium 3.9 3.5 - 5.1 mmol/L   Chloride 106 98 - 111 mmol/L   CO2 25 22 - 32 mmol/L   Glucose, Bld 85 70 - 99 mg/dL   BUN 7 6 - 20 mg/dL   Creatinine, Ser 0.63 0.61 - 1.24 mg/dL   Calcium 9.1 8.9 - 10.3 mg/dL   GFR calc non Af Amer >60 >60 mL/min   GFR calc Af Amer >60 >60 mL/min    Comment: (NOTE) The eGFR has been calculated using the CKD EPI equation. This calculation has not been validated in all clinical situations. eGFR's persistently <60 mL/min signify possible Chronic Kidney Disease.    Anion gap 8 5 - 15    Comment: Performed at [facility], [place] [address], [place], [place] [address]  Glucose, capillary     Status: None   Collection Time: 07/08/18  7:31 AM  Result Value Ref Range   Glucose-Capillary 95 70 - 99 mg/dL  Glucose, capillary     Status: Abnormal   Collection Time: 07/08/18 11:28 AM  Result Value Ref Range   Glucose-Capillary 262 (H) 70 - 99 mg/dL    Mr Foot Left W Wo Contrast  Result Date: 07/07/2018 CLINICAL DATA:  Soreness of the bottom of the foot for 3 days. EXAM: MRI OF THE  LEFT FOREFOOT WITHOUT AND WITH CONTRAST TECHNIQUE: Multiplanar, multisequence MR imaging of the left forefoot was performed both before and after administration of intravenous  contrast. CONTRAST:  92m MULTIHANCE GADOBENATE DIMEGLUMINE 529 MG/ML IV SOLN COMPARISON:  None. FINDINGS: TENDONS Peroneal: Peroneal longus tendon intact. Peroneal brevis intact. Posteromedial: Posterior tibial tendon intact. Flexor hallucis longus tendon intact. Flexor digitorum longus tendon intact. Anterior: Tibialis anterior tendon intact. Extensor hallucis longus tendon intact Extensor digitorum longus tendon intact. Achilles:  Intact. Plantar Fascia: Intact. LIGAMENTS Lateral: Anterior talofibular ligament intact. Calcaneofibular ligament intact. Posterior talofibular ligament intact. Anterior and posterior tibiofibular ligaments intact. Medial: Deltoid ligament intact. Spring ligament intact. CARTILAGE Ankle Joint: No joint effusion. Normal ankle mortise. No chondral defect. Subtalar Joints/Sinus Tarsi: Normal subtalar joints. No subtalar joint effusion. Normal sinus tarsi. Bones/Soft Tissue: Soft tissue wound overlying the base of the fifth metatarsal with surrounding soft tissue edema and enhancement consistent with cellulitis. No bone destruction or periosteal reaction. Mild marrow edema in the second metatarsal head and neck likely reflecting a healing/healed fracture. 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 IMPRESSION: 1. Soft tissue wound overlying the base of the fifth metatarsal with surrounding soft tissue edema and enhancement consistent with cellulitis. No osteomyelitis of left foot. 2. Mild marrow edema in the second metatarsal head and neck likely reflecting a healing/healed fracture. Electronically Signed   By: HKathreen Devoid  On: 07/07/2018 11:26    ROS  ROS: I have reviewed the patient's review of systems thoroughly and there are no positive responses as relates to the HPI. Blood pressure (!) 131/93, pulse (!) 59, temperature 98 F (36.7 C), temperature source Oral, resp. rate 15, height '6\' 1"'$  (1.854 m), weight 97.5 kg, SpO2 100 %. Physical Exam Well-developed  well-nourished patient in no acute distress. Alert and oriented x3 HEENT:within normal limits Cardiac: Regular rate and rhythm Pulmonary: Lungs clear to auscultation Abdomen: Soft and nontender.  Normal active bowel sounds  Musculoskeletal: Left foot has multiple callused areas.  Over the base of the fifth metatarsal there is swelling and what I felt was fluctuance.  This is about a 2 x 2 centimeter area.  Assessment/Plan: 42year old male with a history of left foot pain and cellulitis with concern for swelling.  MRI showed no evidence of osteomyelitis.  We are consulted for evaluation and management of the foot.//After verbal clearance from the patient I ultimately prepped sterilely the area around the base of the fifth metatarsal.  I put an 18-gauge needle and was able to remove 2 cc of frank blood and pus.  I sent this to the lab for Gram stain and culture.  I do not think that there is anything else that we can do for him at this point.  He will need antibiotics per the medical team and I will follow him as an outpatient.  I do not think surgical debridement is indicated in this case.  We will keep an eye on his wound while he is in the hospital but at this point likely can be followed as an outpatient.  Procedure note: After verbal consent was obtained I sterilely prepped the base of the fifth metatarsal area.  An 18-gauge needle was then introduced under the skin.  2 cc of frank pus was removed.  We then put a sterile compressive dressing and a compressive wrap over the foot.  I will follow him up over the next several days.  JAlta Corning8/09/2018, 11:32 AM

## 2018-07-08 NOTE — Progress Notes (Signed)
PROGRESS NOTE    [name]  UEA:[id number] DOB: 07-12-1976 DOA: 07/05/2018 PCP: Patient, No Pcp Per   Brief Narrative:  HPI on 07/05/2018 by Dr. Sanda Klein [name] is a 42 y.o. male with medical history significant of anemia, history of blood, type 2 diabetes who is coming to the emergency department with complaints of progressively worse left foot wound.  He denies fever, but complains of chills, fatigue, malaise and decreased appetite.  He has been using his long-acting insulin, but has not been able to use his NovoLog before meals.  Denies polyuria, polydipsia, polyphagia or blurred vision. Denies headache, sore throat, wheezing, hemoptysis, dyspnea, chest pain, palpitations, dizziness, diaphoresis, PND, orthopnea or pitting edema of the lower extremities.  Denies abdominal pain, nausea, emesis, diarrhea, melena, see patient or hematochezia.  No dysuria, frequency or hematuria.  No heat or cold intolerance.    Interim history Admitted for left foot ulcer. MRI obtained showing cellulitis.  Assessment & Plan   Left diabetic foot ulcer -Left foot x-ray showed no acute abnormality -Wound care consulted and appreciated -MRI of the left foot; soft tissue wound overlying the base of the 5th metatarsal with surrounding soft tissue edema and enhancement consistent with cellulitis. No osteo. Mild marrow edema 2nd metatarsal head and neck- ?healing or healed fracture -ABI Right 1.29, Left 1.27 -Continue ceftriaxone, Flagyl -Continue pain control -Discussed with Dr. Luiz Blare, orthopedic surgery, will see patient today, ?vascular consult  Diabetes mellitus, type II complicated by hypoglycemia -Will hold Lantus as patient had some episodes of hypoglycemia -Continue insulin sliding scale CBG monitoring -Diabetes coordinator consulted and appreciated -Hemoglobin A1c 11.6  Hypokalemia -Resolved with supplementation, continue to monitor BMP  Vitamin B12 deficiency -Vitamin B12 level 129, given  cyanocobalamin injection x 2 -will give additional supplementation today  Nausea -Secondary to antibiotics, improved -Continue antimetics PRN  DVT Prophylaxis  heparin  Code Status: Full  Family Communication: None at bedside  Disposition Plan: Admitted. Orthopedic surgery consulted. Suspect home in a few days  Consultants Orthopedic surgery, Dr. Luiz Blare  Procedures  ABI  Antibiotics   Anti-infectives (From admission, onward)   Start     Dose/Rate Route Frequency Ordered Stop   07/05/18 2200  cefTRIAXone (ROCEPHIN) 2 g in sodium chloride 0.9 % 100 mL IVPB     2 g 200 mL/hr over 30 Minutes Intravenous Every 24 hours 07/05/18 1604     07/05/18 2200  metroNIDAZOLE (FLAGYL) IVPB 500 mg     500 mg 100 mL/hr over 60 Minutes Intravenous Every 8 hours 07/05/18 1604     07/05/18 1445  piperacillin-tazobactam (ZOSYN) IVPB 3.375 g     3.375 g 100 mL/hr over 30 Minutes Intravenous  Once 07/05/18 1433 07/05/18 1555   07/05/18 1445  vancomycin (VANCOCIN) IVPB 1000 mg/200 mL premix     1,000 mg 200 mL/hr over 60 Minutes Intravenous  Once 07/05/18 1444 07/05/18 1834      Subjective:   [name] seen and examined today.  Feeling better today. Has some left foot pain but tolerable. Denies chest pain, shortness of breath, abdominal pain, N/V/D/C. Objective:   Vitals:   07/07/18 0448 07/07/18 1409 07/07/18 2033 07/08/18 0434  BP: (!) 140/93 (!) 158/90 131/88 (!) 131/93  Pulse: (!) 55 (!) 52 62 (!) 59  Resp: 20 16 12 15   Temp: 97.8 F (36.6 C) 97.9 F (36.6 C) 98.2 F (36.8 C) 98 F (36.7 C)  TempSrc: Oral Oral Oral Oral  SpO2: 100% 100% 100% 100%  Weight:      Height:        Intake/Output Summary (Last 24 hours) at 07/08/2018 0949 Last data filed at 07/08/2018 0005 Gross per 24 hour  Intake 480 ml  Output 850 ml  Net -370 ml   Filed Weights   07/05/18 1133  Weight: 97.5 kg   Exam  General: Well developed, well nourished, NAD, appears stated age  HEENT: NCAT,  mucous membranes moist.   Neck: Supple  Cardiovascular: S1 S2 auscultated, RRR, no murmur  Respiratory: Clear to auscultation bilaterally with equal chest rise  Abdomen: Soft, nontender, nondistended, + bowel sounds  Extremities: warm dry without cyanosis clubbing or edema. Left foot ulcer, black and toe tips  Neuro: AAOx3, nonfocal   Psych: Normal affect and demeanor, pleasant   Data Reviewed: I have personally reviewed following labs and imaging studies  CBC: Recent Labs  Lab 07/05/18 1329 07/06/18 0531 07/07/18 0557 07/08/18 0437  WBC 6.1 4.5 2.8* 2.8*  NEUTROABS 4.1 2.6 1.2*  --   HGB 14.2 12.6* 12.9* 12.2*  HCT 40.7 36.6* 37.9* 35.9*  MCV 102.0* 102.2* 103.6* 102.9*  PLT 271 249 239 244   Basic Metabolic Panel: Recent Labs  Lab 07/05/18 1329 07/06/18 0531 07/07/18 0557 07/08/18 0437  NA 135 139 135 139  K 4.2 3.4* 4.1 3.9  CL 99 104 105 106  CO2 23 28 23 25   GLUCOSE 445* 64* 152* 85  BUN 8 7 6 7   CREATININE 0.83 0.62 0.70 0.63  CALCIUM 9.7 9.1 9.1 9.1   GFR: Estimated Creatinine Clearance: 146.3 mL/min (by C-G formula based on SCr of 0.63 mg/dL). Liver Function Tests: Recent Labs  Lab 07/06/18 0531  AST 22  ALT 24  ALKPHOS 70  BILITOT 0.9  PROT 7.5  ALBUMIN 3.3*   No results for input(s): LIPASE, AMYLASE in the last 168 hours. No results for input(s): AMMONIA in the last 168 hours. Coagulation Profile: No results for input(s): INR, PROTIME in the last 168 hours. Cardiac Enzymes: No results for input(s): CKTOTAL, CKMB, CKMBINDEX, TROPONINI in the last 168 hours. BNP (last 3 results) No results for input(s): PROBNP in the last 8760 hours. HbA1C: Recent Labs    07/05/18 1628  HGBA1C 11.6*   CBG: Recent Labs  Lab 07/07/18 0733 07/07/18 1123 07/07/18 1720 07/07/18 2134 07/08/18 0731  GLUCAP 145* 215* 218* 112* 95   Lipid Profile: No results for input(s): CHOL, HDL, LDLCALC, TRIG, CHOLHDL, LDLDIRECT in the last 72 hours. Thyroid  Function Tests: No results for input(s): TSH, T4TOTAL, FREET4, T3FREE, THYROIDAB in the last 72 hours. Anemia Panel: Recent Labs    07/06/18 0531  VITAMINB12 129*  FOLATE 13.8   Urine analysis:    Component Value Date/Time   COLORURINE AMBER (A) 04/17/2012 1812   APPEARANCEUR CLEAR 04/17/2012 1812   LABSPEC 1.016 04/17/2012 1812   PHURINE 8.0 04/17/2012 1812   GLUCOSEU 100 (A) 04/17/2012 1812   HGBUR NEGATIVE 04/17/2012 1812   BILIRUBINUR NEGATIVE 04/17/2012 1812   KETONESUR NEGATIVE 04/17/2012 1812   PROTEINUR NEGATIVE 04/17/2012 1812   UROBILINOGEN 1.0 04/17/2012 1812   NITRITE NEGATIVE 04/17/2012 1812   LEUKOCYTESUR NEGATIVE 04/17/2012 1812   Sepsis Labs: @LABRCNTIP (procalcitonin:4,lacticidven:4)  )No results found for this or any previous visit (from the past 240 hour(s)).    Radiology Studies: Mr Foot Left W Wo Contrast  Result Date: 07/07/2018 CLINICAL DATA:  Soreness of the bottom of the foot for 3 days. EXAM: MRI OF THE LEFT FOREFOOT WITHOUT AND  WITH CONTRAST TECHNIQUE: Multiplanar, multisequence MR imaging of the left forefoot was performed both before and after administration of intravenous contrast. CONTRAST:  20mL MULTIHANCE GADOBENATE DIMEGLUMINE 529 MG/ML IV SOLN COMPARISON:  None. FINDINGS: TENDONS Peroneal: Peroneal longus tendon intact. Peroneal brevis intact. Posteromedial: Posterior tibial tendon intact. Flexor hallucis longus tendon intact. Flexor digitorum longus tendon intact. Anterior: Tibialis anterior tendon intact. Extensor hallucis longus tendon intact Extensor digitorum longus tendon intact. Achilles:  Intact. Plantar Fascia: Intact. LIGAMENTS Lateral: Anterior talofibular ligament intact. Calcaneofibular ligament intact. Posterior talofibular ligament intact. Anterior and posterior tibiofibular ligaments intact. Medial: Deltoid ligament intact. Spring ligament intact. CARTILAGE Ankle Joint: No joint effusion. Normal ankle mortise. No chondral defect.  Subtalar Joints/Sinus Tarsi: Normal subtalar joints. No subtalar joint effusion. Normal sinus tarsi. Bones/Soft Tissue: Soft tissue wound overlying the base of the fifth metatarsal with surrounding soft tissue edema and enhancement consistent with cellulitis. No bone destruction or periosteal reaction. Mild marrow edema in the second metatarsal head and neck likely reflecting a healing/healed fracture. 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 IMPRESSION: 1. Soft tissue wound overlying the base of the fifth metatarsal with surrounding soft tissue edema and enhancement consistent with cellulitis. No osteomyelitis of left foot. 2. Mild marrow edema in the second metatarsal head and neck likely reflecting a healing/healed fracture. Electronically Signed   By: Elige Ko   On: 07/07/2018 11:26     Scheduled Meds: . heparin  5,000 Units Subcutaneous Q8H  . insulin aspart  0-15 Units Subcutaneous TID WC  . insulin glargine  30 Units Subcutaneous Daily   Continuous Infusions: . sodium chloride 125 mL/hr at 07/07/18 1923  . cefTRIAXone (ROCEPHIN)  IV 2 g (07/08/18 0024)   And  . metronidazole 500 mg (07/08/18 0705)     LOS: 3 days   Time Spent in minutes   30 minutes  Kanye Depree D.O. on 07/08/2018 at 9:49 AM  Between 7am to 7pm - Pager - [phone]  After 7pm go to [email or web address] - password [email or web address]  And look for the night coverage person covering for me after hours  [facility] Office  [phone]

## 2018-07-09 DIAGNOSIS — E11649 Type 2 diabetes mellitus with hypoglycemia without coma: Secondary | ICD-10-CM

## 2018-07-09 LAB — GLUCOSE, CAPILLARY
GLUCOSE-CAPILLARY: 236 mg/dL — AB (ref 70–99)
Glucose-Capillary: 79 mg/dL (ref 70–99)

## 2018-07-09 MED ORDER — CEPHALEXIN 500 MG PO CAPS: 500.0000 mg | ORAL_CAPSULE | Freq: Four times a day (QID) | ORAL | 0 refills | Status: AC

## 2018-07-09 MED ORDER — DOXYCYCLINE HYCLATE 100 MG PO TABS: 100.0000 mg | ORAL_TABLET | Freq: Two times a day (BID) | ORAL | 0 refills | Status: DC

## 2018-07-09 MED ORDER — BLOOD GLUCOSE METER KIT: PACK | 0 refills | Status: DC

## 2018-07-09 MED ORDER — CYANOCOBALAMIN 500 MCG PO TABS: 500.0000 ug | ORAL_TABLET | Freq: Every day | ORAL | Status: DC

## 2018-07-09 NOTE — Discharge Summary (Signed)
Physician Discharge Summary  [name] JIR:[id number] DOB: 15-Sep-1976 DOA: 07/05/2018  PCP: Patient, No Pcp Per  Admit date: 07/05/2018 Discharge date: 07/09/2018  Time spent: 45 minutes  Recommendations for Outpatient Follow-up:  Patient will be discharged to home.  Patient will need to follow up with primary care provider within one week of discharge.  Follow up with Dr. Berenice Primas, orthopedic surgeon, on 07/12/2018. Patient should continue medications as prescribed.  Patient should follow a carb modified diet.   Discharge Diagnoses:  Left diabetic foot ulcer Diabetes mellitus, type II complicated by hypoglycemia Hypokalemia Vitamin B12 deficiency Nausea  Discharge Condition: Stable  Diet recommendation: carb modified  Filed Weights   07/05/18 1133  Weight: 97.5 kg    History of present illness:  on 07/05/2018 by Dr. Tennis Must Loma Wall a 42 y.o.malewith medical history significant of anemia, history of blood, type 2 diabetes who is coming to the emergency department with complaints of progressively worse left foot wound. He denies fever, but complains of chills, fatigue, malaise and decreased appetite. He has been using his long-acting insulin, but has not been able to use his NovoLog before meals. Denies polyuria, polydipsia, polyphagia or blurred vision. Denies headache, sore throat, wheezing, hemoptysis, dyspnea, chest pain, palpitations, dizziness, diaphoresis, PND, orthopnea or pitting edema of the lower extremities. Denies abdominal pain, nausea, emesis, diarrhea, melena, see patient or hematochezia. No dysuria, frequency or hematuria. No heat or cold intolerance.   Hospital Course:  Left diabetic foot ulcer -Left foot x-ray showed no acute abnormality -Wound care consulted and appreciated -MRI of the left foot; soft tissue wound overlying the base of the 5th metatarsal with surrounding soft tissue edema and enhancement consistent with cellulitis. No osteo. Mild  marrow edema 2nd metatarsal head and neck- ?healing or healed fracture -ABI Right 1.29, Left 1.27 -was placed onceftriaxone, Flagyl- will discharge patient with doxycycline and cephalexin -Continue pain control -Dr. Berenice Primas, orthopedics, consulted and appreciated, s/p bedside aspiration of foot wound with 2cc frank push removed -culture: Few GPC in pairs and clusters -Discussed with Dr. Berenice Primas, patient is to follow up in his office on 07/12/2018.   Diabetes mellitus, type II complicated by hypoglycemia -Will hold Lantus as patient had some episodes of hypoglycemia -Continue insulin sliding scale CBG monitoring -Diabetes coordinator consulted and appreciated -Hemoglobin A1c 11.6  Hypokalemia -Resolved with supplementation, continue to monitor BMP  Vitamin B12 deficiency -Vitamin B12 level 129, given cyanocobalamin injection x 3 -continue oral supplementation and follow up with PCP  Nausea -Secondary to antibiotics, resolved  Procedures: Bedside aspiration of diabetic foot wound  Consultations: Orthopedic surgery, Dr. Berenice Primas  Discharge Exam: Vitals:   07/08/18 2047 07/09/18 0505  BP: (!) 146/86 134/89  Pulse: 60 68  Resp: 16   Temp: 98.2 F (36.8 C) 97.7 F (36.5 C)  SpO2: 100% 98%     General: Well developed, well nourished, NAD, appears stated age  HEENT: NCAT, PERRLA, EOMI, Anicteic Sclera, mucous membranes moist.  Neck: Supple, no JVD, no masses  Cardiovascular: S1 S2 auscultated, no rubs, murmurs or gallops. Regular rate and rhythm.  Respiratory: Clear to auscultation bilaterally with equal chest rise  Abdomen: Soft, nontender, nondistended, + bowel sounds  Extremities: warm dry without cyanosis clubbing or edema  Neuro: AAOx3, cranial nerves grossly intact. Strength 5/5 in patient's upper and lower extremities bilaterally  Skin: Without rashes exudates or nodules  Psych: Normal affect and demeanor with intact judgement and insight  Discharge  Instructions Discharge Instructions    Discharge instructions  Complete by:  As directed    Patient will be discharged to home.  Patient will need to follow up with primary care provider within one week of discharge.  Follow up with Dr. Berenice Primas, orthopedic surgeon, on 07/12/2018. Patient should continue medications as prescribed.  Patient should follow a carb modified diet.     Allergies as of 07/09/2018   No Known Allergies     Medication List    STOP taking these medications   promethazine 25 MG tablet Commonly known as:  PHENERGAN   ranitidine 150 MG capsule Commonly known as:  ZANTAC     TAKE these medications   BC FAST PAIN RELIEF PO Take 1 packet by mouth 2 (two) times daily as needed (headache).   blood glucose meter kit and supplies Dispense based on patient and insurance preference. Use up to four times daily as directed. (FOR ICD-10 E10.9, E11.9).   cephALEXin 500 MG capsule Commonly known as:  KEFLEX Take 1 capsule (500 mg total) by mouth 4 (four) times daily for 10 days.   doxycycline 100 MG tablet Commonly known as:  VIBRA-TABS Take 1 tablet (100 mg total) by mouth 2 (two) times daily.   GOODY HEADACHE PO Take 1 packet by mouth 2 (two) times daily as needed (headache).   insulin aspart 100 UNIT/ML injection Commonly known as:  novoLOG Inject 5 Units into the skin 3 (three) times daily before meals.   TOUJEO SOLOSTAR [place] Inject 30 Units into the skin daily.   vitamin B-12 500 MCG tablet Commonly known as:  CYANOCOBALAMIN Take 1 tablet (500 mcg total) by mouth daily.      No Known Allergies Follow-up Information    [facility] Follow up on 07/16/2018.   Specialty:  Internal Medicine Why:  Appointment at 9:20 AM at the [facility]. Contact information: [address] [place]       Dorna Leitz, MD Follow up.   Specialty:  Orthopedic Surgery Why:  Thursday this week.  Call  for appointment. Contact information: [place] [address] [phone]            The results of significant diagnostics from this hospitalization (including imaging, microbiology, ancillary and laboratory) are listed below for reference.    Significant Diagnostic Studies: Mr Foot Left W Wo Contrast  Result Date: 07/07/2018 CLINICAL DATA:  Soreness of the bottom of the foot for 3 days. EXAM: MRI OF THE LEFT FOREFOOT WITHOUT AND WITH CONTRAST TECHNIQUE: Multiplanar, multisequence MR imaging of the left forefoot was performed both before and after administration of intravenous contrast. CONTRAST:  54m MULTIHANCE GADOBENATE DIMEGLUMINE 529 MG/ML IV SOLN COMPARISON:  None. FINDINGS: TENDONS Peroneal: Peroneal longus tendon intact. Peroneal brevis intact. Posteromedial: Posterior tibial tendon intact. Flexor hallucis longus tendon intact. Flexor digitorum longus tendon intact. Anterior: Tibialis anterior tendon intact. Extensor hallucis longus tendon intact Extensor digitorum longus tendon intact. Achilles:  Intact. Plantar Fascia: Intact. LIGAMENTS Lateral: Anterior talofibular ligament intact. Calcaneofibular ligament intact. Posterior talofibular ligament intact. Anterior and posterior tibiofibular ligaments intact. Medial: Deltoid ligament intact. Spring ligament intact. CARTILAGE Ankle Joint: No joint effusion. Normal ankle mortise. No chondral defect. Subtalar Joints/Sinus Tarsi: Normal subtalar joints. No subtalar joint effusion. Normal sinus tarsi. Bones/Soft Tissue: Soft tissue wound overlying the base of the fifth metatarsal with surrounding soft tissue edema and enhancement consistent with cellulitis. No bone destruction or periosteal reaction. Mild marrow edema in the second metatarsal head  and neck likely reflecting a healing/healed fracture. 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 IMPRESSION: 1. Soft tissue wound overlying the base of the fifth metatarsal  with surrounding soft tissue edema and enhancement consistent with cellulitis. No osteomyelitis of left foot. 2. Mild marrow edema in the second metatarsal head and neck likely reflecting a healing/healed fracture. Electronically Signed   By: Kathreen Devoid   On: 07/07/2018 11:26   Dg Foot Complete Left  Result Date: 07/05/2018 CLINICAL DATA:  Acute LEFT foot pain with wound along 2nd toe. Initial encounter. EXAM: LEFT FOOT - COMPLETE 3+ VIEW COMPARISON:  None. FINDINGS: No evidence of acute bony abnormality including fracture or osteomyelitis. No evidence of subluxation or dislocation. Mild degenerative changes at the 1st MTP joint and within the midfoot noted. No radiopaque foreign bodies identified. IMPRESSION: No acute abnormality. Electronically Signed   By: Margarette Canada M.D.   On: 07/05/2018 14:03    Microbiology: Recent Results (from the past 240 hour(s))  Aerobic/Anaerobic Culture (surgical/deep wound)     Status: None (Preliminary result)   Collection Time: 07/08/18 11:38 AM  Result Value Ref Range Status   Specimen Description   Final    WOUND Performed at [facility] [address] [place], [place] [address]    Special Requests   Final    Normal Performed at [facility], [place] [address] [place], [place] [address]    Gram Stain   Final    FEW WBC PRESENT,BOTH PMN AND MONONUCLEAR FEW GRAM POSITIVE COCCI IN PAIRS IN CLUSTERS    Culture   Final    CULTURE REINCUBATED FOR BETTER GROWTH Performed at [facility], [place] [address], [place], [place] [address]    Report Status PENDING  Incomplete     Labs: Basic Metabolic Panel: Recent Labs  Lab 07/05/18 1329 07/06/18 0531 07/07/18 0557 07/08/18 0437  NA 135 139 135 139  K 4.2 3.4* 4.1 3.9  CL 99 104 105 106  CO2 _0 GLUCOSE 445* 64* 152* 85  BUN _1 CREATININE 0.83 0.62 0.70 0.63  CALCIUM 9.7 9.1 9.1 9.1   Liver Function Tests: Recent Labs  Lab 07/06/18 0531   AST 22  ALT 24  ALKPHOS 70  BILITOT 0.9  PROT 7.5  ALBUMIN 3.3*   No results for input(s): LIPASE, AMYLASE in the last 168 hours. No results for input(s): AMMONIA in the last 168 hours. CBC: Recent Labs  Lab 07/05/18 1329 07/06/18 0531 07/07/18 0557 07/08/18 0437  WBC 6.1 4.5 2.8* 2.8*  NEUTROABS 4.1 2.6 1.2*  --   HGB 14.2 12.6* 12.9* 12.2*  HCT 40.7 36.6* 37.9* 35.9*  MCV 102.0* 102.2* 103.6* 102.9*  PLT 271 249 239 244   Cardiac Enzymes: No results for input(s): CKTOTAL, CKMB, CKMBINDEX, TROPONINI in the last 168 hours. BNP: BNP (last 3 results) No results for input(s): BNP in the last 8760 hours.  ProBNP (last 3 results) No results for input(s): PROBNP in the last 8760 hours.  CBG: Recent Labs  Lab 07/08/18 0731 07/08/18 1128 07/08/18 1638 07/08/18 2123 07/09/18 0722  GLUCAP 95 262* 151* 76 79       Signed:  [facility] 07/09/2018, 11:35 AM

## 2018-07-09 NOTE — Discharge Instructions (Signed)
Keep foot clean and dry.  May wear bandage until follow-up appointment.

## 2018-07-09 NOTE — Care Management Note (Signed)
Case Management Note  Patient Details  Name: [name] MRN: [id number] Date of Birth: 1976-10-14  Subjective/Objective: [facility] confirmed cost of meds$25 in total([facility] can afford, [facility] patient already has insulin pens.Patient aware of hospital f/u appt [facility] on f/u section of d/c. Patient voiced understanding. No further CM needs.                   Action/Plan:d/c home.   Expected Discharge Date:  07/09/18               Expected Discharge Plan:  Home/Self Care  In-House Referral:     Discharge planning Services  CM Consult, Follow-up appt scheduled, [facility], Medication Assistance  Post Acute Care Choice:    Choice offered to:     DME Arranged:    DME Agency:     HH Arranged:    HH Agency:     Status of Service:  Completed, signed off  If discussed at [employer] Meetings, dates discussed:    Additional Comments:  Lanier ClamMahabir, Trease Bremner, RN 07/09/2018, 12:09 PM

## 2018-07-09 NOTE — Progress Notes (Signed)
Subjective: Patient doing well today.  No complaints of pain.   Objective: Vital signs in last 24 hours: Temp:  [97.7 F (36.5 C)-98.2 F (36.8 C)] 97.7 F (36.5 C) (08/12 0505) Pulse Rate:  [60-70] 68 (08/12 0505) Resp:  [12-16] 16 (08/11 2047) BP: (126-146)/(82-89) 134/89 (08/12 0505) SpO2:  [98 %-100 %] 98 % (08/12 0505)  Intake/Output from previous day: 08/11 0701 - 08/12 0700 In: 5660.1 [P.O.:480; I.V.:2505; IV Piggyback:2675.2] Out: 2200 [Urine:2200] Intake/Output this shift: No intake/output data recorded.  Recent Labs    07/07/18 0557 07/08/18 0437  HGB 12.9* 12.2*   Recent Labs    07/07/18 0557 07/08/18 0437  WBC 2.8* 2.8*  RBC 3.66* 3.49*  HCT 37.9* 35.9*  PLT 239 244   Recent Labs    07/07/18 0557 07/08/18 0437  NA 135 139  K 4.1 3.9  CL 105 106  CO2 23 25  BUN 6 7  CREATININE 0.70 0.63  GLUCOSE 152* 85  CALCIUM 9.1 9.1   No results for input(s): LABPT, INR in the last 72 hours.  Neurologically intact ABD soft Neurovascular intact Wound over lateral aspect of the foot looks good.  Minimal drainage overnight.      Assessment/Plan: [age] year old male with poorly controlled diabetes who had a small abscess over the lateral aspect of his foot.  It was relatively superficial.  I aspirated that yesterday.  He likely can be discharged per his internal medicine team.  Issues for him are going to be duration of antibiotics and diabetic control.  I can see him in the office Thursday of this week to check on his foot wound.   [name] 07/09/2018, 8:20 AM

## 2018-07-13 LAB — AEROBIC/ANAEROBIC CULTURE (SURGICAL/DEEP WOUND)

## 2018-07-13 LAB — AEROBIC/ANAEROBIC CULTURE W GRAM STAIN (SURGICAL/DEEP WOUND): Special Requests: NORMAL

## 2018-07-16 ENCOUNTER — Ambulatory Visit: Payer: Medicaid Other | Admitting: Family Medicine

## 2018-08-14 ENCOUNTER — Encounter (HOSPITAL_BASED_OUTPATIENT_CLINIC_OR_DEPARTMENT_OTHER): Payer: Medicaid Other | Attending: Internal Medicine

## 2018-08-14 ENCOUNTER — Other Ambulatory Visit (HOSPITAL_COMMUNITY)
Admission: RE | Admit: 2018-08-14 | Discharge: 2018-08-14 | Disposition: A | Discharge status: Home / Self Care | Payer: Medicaid Other | Source: Other Acute Inpatient Hospital | Attending: Internal Medicine | Admitting: Internal Medicine

## 2018-08-14 DIAGNOSIS — E538 Deficiency of other specified B group vitamins: Secondary | ICD-10-CM | POA: Insufficient documentation

## 2018-08-14 DIAGNOSIS — Z794 Long term (current) use of insulin: Secondary | ICD-10-CM | POA: Insufficient documentation

## 2018-08-14 DIAGNOSIS — E1142 Type 2 diabetes mellitus with diabetic polyneuropathy: Secondary | ICD-10-CM | POA: Insufficient documentation

## 2018-08-14 DIAGNOSIS — K7460 Unspecified cirrhosis of liver: Secondary | ICD-10-CM | POA: Insufficient documentation

## 2018-08-14 DIAGNOSIS — I7300 Raynaud's syndrome without gangrene: Secondary | ICD-10-CM | POA: Insufficient documentation

## 2018-08-14 DIAGNOSIS — B951 Streptococcus, group B, as the cause of diseases classified elsewhere: Secondary | ICD-10-CM | POA: Insufficient documentation

## 2018-08-14 DIAGNOSIS — E11621 Type 2 diabetes mellitus with foot ulcer: Secondary | ICD-10-CM | POA: Insufficient documentation

## 2018-08-14 DIAGNOSIS — E1140 Type 2 diabetes mellitus with diabetic neuropathy, unspecified: Secondary | ICD-10-CM | POA: Insufficient documentation

## 2018-08-14 DIAGNOSIS — L97523 Non-pressure chronic ulcer of other part of left foot with necrosis of muscle: Secondary | ICD-10-CM | POA: Insufficient documentation

## 2018-08-14 DIAGNOSIS — E1151 Type 2 diabetes mellitus with diabetic peripheral angiopathy without gangrene: Secondary | ICD-10-CM | POA: Insufficient documentation

## 2018-08-16 LAB — AEROBIC CULTURE  (SUPERFICIAL SPECIMEN)

## 2018-08-16 LAB — AEROBIC CULTURE W GRAM STAIN (SUPERFICIAL SPECIMEN): Gram Stain: NONE SEEN

## 2018-08-30 ENCOUNTER — Encounter (HOSPITAL_BASED_OUTPATIENT_CLINIC_OR_DEPARTMENT_OTHER): Payer: Medicaid Other | Attending: Internal Medicine

## 2018-08-30 DIAGNOSIS — K7460 Unspecified cirrhosis of liver: Secondary | ICD-10-CM | POA: Insufficient documentation

## 2018-08-30 DIAGNOSIS — E11621 Type 2 diabetes mellitus with foot ulcer: Secondary | ICD-10-CM | POA: Insufficient documentation

## 2018-08-30 DIAGNOSIS — B9561 Methicillin susceptible Staphylococcus aureus infection as the cause of diseases classified elsewhere: Secondary | ICD-10-CM | POA: Insufficient documentation

## 2018-08-30 DIAGNOSIS — L97522 Non-pressure chronic ulcer of other part of left foot with fat layer exposed: Secondary | ICD-10-CM | POA: Insufficient documentation

## 2018-08-30 DIAGNOSIS — E1140 Type 2 diabetes mellitus with diabetic neuropathy, unspecified: Secondary | ICD-10-CM | POA: Insufficient documentation

## 2018-08-30 DIAGNOSIS — E1151 Type 2 diabetes mellitus with diabetic peripheral angiopathy without gangrene: Secondary | ICD-10-CM | POA: Insufficient documentation

## 2018-08-30 DIAGNOSIS — B9689 Other specified bacterial agents as the cause of diseases classified elsewhere: Secondary | ICD-10-CM | POA: Insufficient documentation

## 2018-08-30 DIAGNOSIS — E1142 Type 2 diabetes mellitus with diabetic polyneuropathy: Secondary | ICD-10-CM | POA: Insufficient documentation

## 2018-08-30 DIAGNOSIS — I7300 Raynaud's syndrome without gangrene: Secondary | ICD-10-CM | POA: Insufficient documentation

## 2018-09-06 ENCOUNTER — Other Ambulatory Visit (HOSPITAL_COMMUNITY)
Admission: RE | Admit: 2018-09-06 | Discharge: 2018-09-06 | Disposition: A | Discharge status: Home / Self Care | Payer: Medicaid Other | Source: Other Acute Inpatient Hospital | Attending: Internal Medicine | Admitting: Internal Medicine

## 2018-09-06 DIAGNOSIS — E11621 Type 2 diabetes mellitus with foot ulcer: Secondary | ICD-10-CM | POA: Insufficient documentation

## 2018-09-06 DIAGNOSIS — L97529 Non-pressure chronic ulcer of other part of left foot with unspecified severity: Secondary | ICD-10-CM | POA: Insufficient documentation

## 2018-09-11 LAB — AEROBIC CULTURE W GRAM STAIN (SUPERFICIAL SPECIMEN)

## 2018-09-11 LAB — AEROBIC CULTURE  (SUPERFICIAL SPECIMEN)

## 2018-09-29 ENCOUNTER — Other Ambulatory Visit (HOSPITAL_BASED_OUTPATIENT_CLINIC_OR_DEPARTMENT_OTHER): Payer: Self-pay | Admitting: Internal Medicine

## 2018-09-29 ENCOUNTER — Ambulatory Visit (HOSPITAL_COMMUNITY)
Admission: RE | Admit: 2018-09-29 | Discharge: 2018-09-29 | Disposition: A | Discharge status: Home / Self Care | Payer: Medicaid Other | Source: Ambulatory Visit | Attending: Internal Medicine | Admitting: Internal Medicine

## 2018-09-29 DIAGNOSIS — L97521 Non-pressure chronic ulcer of other part of left foot limited to breakdown of skin: Secondary | ICD-10-CM | POA: Insufficient documentation

## 2018-09-29 DIAGNOSIS — E13621 Other specified diabetes mellitus with foot ulcer: Secondary | ICD-10-CM

## 2018-09-29 DIAGNOSIS — E11621 Type 2 diabetes mellitus with foot ulcer: Secondary | ICD-10-CM | POA: Insufficient documentation

## 2018-10-02 ENCOUNTER — Encounter (HOSPITAL_BASED_OUTPATIENT_CLINIC_OR_DEPARTMENT_OTHER): Payer: Medicaid Other | Attending: Internal Medicine

## 2018-10-02 DIAGNOSIS — I7300 Raynaud's syndrome without gangrene: Secondary | ICD-10-CM | POA: Insufficient documentation

## 2018-10-02 DIAGNOSIS — L03116 Cellulitis of left lower limb: Secondary | ICD-10-CM | POA: Insufficient documentation

## 2018-10-02 DIAGNOSIS — L97822 Non-pressure chronic ulcer of other part of left lower leg with fat layer exposed: Secondary | ICD-10-CM | POA: Insufficient documentation

## 2018-10-02 DIAGNOSIS — E11621 Type 2 diabetes mellitus with foot ulcer: Secondary | ICD-10-CM | POA: Insufficient documentation

## 2018-10-02 DIAGNOSIS — E1151 Type 2 diabetes mellitus with diabetic peripheral angiopathy without gangrene: Secondary | ICD-10-CM | POA: Insufficient documentation

## 2018-10-02 DIAGNOSIS — E1142 Type 2 diabetes mellitus with diabetic polyneuropathy: Secondary | ICD-10-CM | POA: Insufficient documentation

## 2018-10-04 ENCOUNTER — Other Ambulatory Visit (HOSPITAL_BASED_OUTPATIENT_CLINIC_OR_DEPARTMENT_OTHER): Payer: Self-pay | Admitting: Internal Medicine

## 2018-10-04 DIAGNOSIS — L97528 Non-pressure chronic ulcer of other part of left foot with other specified severity: Principal | ICD-10-CM

## 2018-10-04 DIAGNOSIS — E13621 Other specified diabetes mellitus with foot ulcer: Secondary | ICD-10-CM

## 2018-10-06 ENCOUNTER — Encounter (HOSPITAL_COMMUNITY): Payer: Self-pay

## 2018-10-06 ENCOUNTER — Ambulatory Visit (HOSPITAL_COMMUNITY)
Admission: RE | Admit: 2018-10-06 | Discharge: 2018-10-06 | Disposition: A | Discharge status: Home / Self Care | Payer: Self-pay | Source: Ambulatory Visit | Attending: Internal Medicine | Admitting: Internal Medicine

## 2018-10-06 DIAGNOSIS — L97529 Non-pressure chronic ulcer of other part of left foot with unspecified severity: Secondary | ICD-10-CM | POA: Insufficient documentation

## 2018-10-06 DIAGNOSIS — E11621 Type 2 diabetes mellitus with foot ulcer: Secondary | ICD-10-CM | POA: Insufficient documentation

## 2018-10-06 DIAGNOSIS — M869 Osteomyelitis, unspecified: Secondary | ICD-10-CM | POA: Insufficient documentation

## 2018-10-06 DIAGNOSIS — L03116 Cellulitis of left lower limb: Secondary | ICD-10-CM | POA: Insufficient documentation

## 2018-10-06 DIAGNOSIS — E1169 Type 2 diabetes mellitus with other specified complication: Secondary | ICD-10-CM | POA: Insufficient documentation

## 2018-10-06 DIAGNOSIS — L97528 Non-pressure chronic ulcer of other part of left foot with other specified severity: Secondary | ICD-10-CM

## 2018-10-06 DIAGNOSIS — M609 Myositis, unspecified: Secondary | ICD-10-CM | POA: Insufficient documentation

## 2018-10-06 DIAGNOSIS — E13621 Other specified diabetes mellitus with foot ulcer: Secondary | ICD-10-CM

## 2018-10-06 MED ORDER — GADOBUTROL 1 MMOL/ML IV SOLN
10.0000 mL | Freq: Once | INTRAVENOUS | Status: AC | PRN
  Administered 2018-10-06: 10 mL via INTRAVENOUS

## 2018-10-08 ENCOUNTER — Other Ambulatory Visit: Payer: Self-pay

## 2018-10-08 ENCOUNTER — Encounter (HOSPITAL_COMMUNITY): Payer: Self-pay

## 2018-10-08 ENCOUNTER — Inpatient Hospital Stay (HOSPITAL_COMMUNITY)
Admission: EM | Admit: 2018-10-08 | Discharge: 2018-10-12 | DRG: 617 | Disposition: A | Discharge status: Home / Self Care | Payer: Self-pay | Attending: Internal Medicine | Admitting: Internal Medicine

## 2018-10-08 DIAGNOSIS — E871 Hypo-osmolality and hyponatremia: Secondary | ICD-10-CM | POA: Diagnosis present

## 2018-10-08 DIAGNOSIS — M869 Osteomyelitis, unspecified: Secondary | ICD-10-CM | POA: Diagnosis present

## 2018-10-08 DIAGNOSIS — L97529 Non-pressure chronic ulcer of other part of left foot with unspecified severity: Secondary | ICD-10-CM

## 2018-10-08 DIAGNOSIS — Z6825 Body mass index (BMI) 25.0-25.9, adult: Secondary | ICD-10-CM

## 2018-10-08 DIAGNOSIS — Z794 Long term (current) use of insulin: Secondary | ICD-10-CM

## 2018-10-08 DIAGNOSIS — Z79899 Other long term (current) drug therapy: Secondary | ICD-10-CM

## 2018-10-08 DIAGNOSIS — F1010 Alcohol abuse, uncomplicated: Secondary | ICD-10-CM | POA: Diagnosis present

## 2018-10-08 DIAGNOSIS — E10621 Type 1 diabetes mellitus with foot ulcer: Secondary | ICD-10-CM | POA: Diagnosis present

## 2018-10-08 DIAGNOSIS — E46 Unspecified protein-calorie malnutrition: Secondary | ICD-10-CM | POA: Diagnosis present

## 2018-10-08 DIAGNOSIS — Z833 Family history of diabetes mellitus: Secondary | ICD-10-CM

## 2018-10-08 DIAGNOSIS — E1069 Type 1 diabetes mellitus with other specified complication: Principal | ICD-10-CM | POA: Diagnosis present

## 2018-10-08 DIAGNOSIS — L97523 Non-pressure chronic ulcer of other part of left foot with necrosis of muscle: Secondary | ICD-10-CM

## 2018-10-08 DIAGNOSIS — Z87891 Personal history of nicotine dependence: Secondary | ICD-10-CM

## 2018-10-08 DIAGNOSIS — L97409 Non-pressure chronic ulcer of unspecified heel and midfoot with unspecified severity: Secondary | ICD-10-CM | POA: Diagnosis present

## 2018-10-08 DIAGNOSIS — L03116 Cellulitis of left lower limb: Secondary | ICD-10-CM | POA: Diagnosis present

## 2018-10-08 DIAGNOSIS — L02612 Cutaneous abscess of left foot: Secondary | ICD-10-CM | POA: Diagnosis present

## 2018-10-08 DIAGNOSIS — E1040 Type 1 diabetes mellitus with diabetic neuropathy, unspecified: Secondary | ICD-10-CM | POA: Diagnosis present

## 2018-10-08 DIAGNOSIS — D649 Anemia, unspecified: Secondary | ICD-10-CM | POA: Diagnosis present

## 2018-10-08 DIAGNOSIS — L97509 Non-pressure chronic ulcer of other part of unspecified foot with unspecified severity: Secondary | ICD-10-CM

## 2018-10-08 DIAGNOSIS — L97524 Non-pressure chronic ulcer of other part of left foot with necrosis of bone: Secondary | ICD-10-CM

## 2018-10-08 DIAGNOSIS — M609 Myositis, unspecified: Secondary | ICD-10-CM | POA: Diagnosis present

## 2018-10-08 DIAGNOSIS — E11621 Type 2 diabetes mellitus with foot ulcer: Secondary | ICD-10-CM

## 2018-10-08 DIAGNOSIS — L97424 Non-pressure chronic ulcer of left heel and midfoot with necrosis of bone: Secondary | ICD-10-CM

## 2018-10-08 DIAGNOSIS — M86272 Subacute osteomyelitis, left ankle and foot: Secondary | ICD-10-CM

## 2018-10-08 DIAGNOSIS — M659 Synovitis and tenosynovitis, unspecified: Secondary | ICD-10-CM | POA: Diagnosis present

## 2018-10-08 LAB — BASIC METABOLIC PANEL
Anion gap: 8 (ref 5–15)
BUN: 10 mg/dL (ref 6–20)
CALCIUM: 8.4 mg/dL — AB (ref 8.9–10.3)
CO2: 24 mmol/L (ref 22–32)
CREATININE: 0.81 mg/dL (ref 0.61–1.24)
Chloride: 98 mmol/L (ref 98–111)
GFR calc non Af Amer: >60 mL/min (ref >60)
GLUCOSE: 238 mg/dL — AB (ref 70–99)
Potassium: 3.7 mmol/L (ref 3.5–5.1)
Sodium: 130 mmol/L — ABNORMAL LOW (ref 135–145)

## 2018-10-08 LAB — CBC WITH DIFFERENTIAL/PLATELET
Abs Immature Granulocytes: 0.04 10*3/uL (ref 0.00–0.07)
BASOS ABS: 0 10*3/uL (ref 0.0–0.1)
Basophils Relative: 0 %
EOS PCT: 0 %
Eosinophils Absolute: 0 10*3/uL (ref 0.0–0.5)
HEMATOCRIT: 30.7 % — AB (ref 39.0–52.0)
HEMOGLOBIN: 10.3 g/dL — AB (ref 13.0–17.0)
IMMATURE GRANULOCYTES: 1 %
LYMPHS ABS: 0.7 10*3/uL (ref 0.7–4.0)
LYMPHS PCT: 9 %
MCH: 33.9 pg (ref 26.0–34.0)
MCHC: 33.6 g/dL (ref 30.0–36.0)
MCV: 101 fL — AB (ref 80.0–100.0)
Monocytes Absolute: 0.8 10*3/uL (ref 0.1–1.0)
Monocytes Relative: 10 %
NRBC: 0 % (ref 0.0–0.2)
Neutro Abs: 6.6 10*3/uL (ref 1.7–7.7)
Neutrophils Relative %: 80 %
Platelets: 290 10*3/uL (ref 150–400)
RBC: 3.04 MIL/uL — ABNORMAL LOW (ref 4.22–5.81)
RDW: 13.1 % (ref 11.5–15.5)
WBC: 8.2 10*3/uL (ref 4.0–10.5)

## 2018-10-08 LAB — HEMOGLOBIN A1C
Hgb A1c MFr Bld: 8.9 % — ABNORMAL HIGH (ref 4.8–5.6)
MEAN PLASMA GLUCOSE: 208.73 mg/dL

## 2018-10-08 LAB — CBG MONITORING, ED: Glucose-Capillary: 236 mg/dL — ABNORMAL HIGH (ref 70–99)

## 2018-10-08 LAB — GLUCOSE, CAPILLARY
Glucose-Capillary: 99 mg/dL (ref 70–99)
Glucose-Capillary: 114 mg/dL — ABNORMAL HIGH (ref 70–99)

## 2018-10-08 LAB — POCT I-STAT CREATININE: CREATININE: 0.7 mg/dL (ref 0.61–1.24)

## 2018-10-08 MED ORDER — INSULIN DETEMIR 100 UNIT/ML ~~LOC~~ SOLN
30.0000 [IU] | Freq: Every day | SUBCUTANEOUS | Status: DC
  Administered 2018-10-09: 30 [IU] via SUBCUTANEOUS
  Administered 2018-10-10: 15 [IU] via SUBCUTANEOUS
  Administered 2018-10-11 – 2018-10-12 (×2): 30 [IU] via SUBCUTANEOUS
  Filled 2018-10-08 (×4): qty 0.3

## 2018-10-08 MED ORDER — INSULIN ASPART 100 UNIT/ML ~~LOC~~ SOLN
0.0000 [IU] | Freq: Three times a day (TID) | SUBCUTANEOUS | Status: DC
  Administered 2018-10-09: 3 [IU] via SUBCUTANEOUS
  Administered 2018-10-10 (×2): 2 [IU] via SUBCUTANEOUS
  Administered 2018-10-11: 3 [IU] via SUBCUTANEOUS
  Administered 2018-10-11: 2 [IU] via SUBCUTANEOUS
  Administered 2018-10-12: 5 [IU] via SUBCUTANEOUS

## 2018-10-08 MED ORDER — SODIUM CHLORIDE 0.9 % IV BOLUS
1000.0000 mL | Freq: Once | INTRAVENOUS | Status: AC
  Administered 2018-10-08: 1000 mL via INTRAVENOUS

## 2018-10-08 MED ORDER — ENOXAPARIN SODIUM 40 MG/0.4ML ~~LOC~~ SOLN
40.0000 mg | SUBCUTANEOUS | Status: DC
  Administered 2018-10-08 – 2018-10-11 (×3): 40 mg via SUBCUTANEOUS
  Filled 2018-10-08 (×3): qty 0.4

## 2018-10-08 MED ORDER — VANCOMYCIN HCL 10 G IV SOLR
1500.0000 mg | Freq: Two times a day (BID) | INTRAVENOUS | Status: DC
  Administered 2018-10-08 – 2018-10-11 (×6): 1500 mg via INTRAVENOUS
  Filled 2018-10-08 (×8): qty 1500

## 2018-10-08 MED ORDER — VANCOMYCIN HCL IN DEXTROSE 1-5 GM/200ML-% IV SOLN
1000.0000 mg | Freq: Once | INTRAVENOUS | Status: AC
  Administered 2018-10-08: 1000 mg via INTRAVENOUS
  Filled 2018-10-08: qty 200

## 2018-10-08 MED ORDER — PIPERACILLIN-TAZOBACTAM 3.375 G IVPB
3.3750 g | Freq: Three times a day (TID) | INTRAVENOUS | Status: DC
  Administered 2018-10-08 – 2018-10-11 (×9): 3.375 g via INTRAVENOUS
  Filled 2018-10-08 (×9): qty 50

## 2018-10-08 MED ORDER — INSULIN ASPART 100 UNIT/ML ~~LOC~~ SOLN: 0.0000 [IU] | Freq: Every day | SUBCUTANEOUS | Status: DC

## 2018-10-08 MED ORDER — MORPHINE SULFATE (PF) 2 MG/ML IV SOLN
2.0000 mg | INTRAVENOUS | Status: DC | PRN
  Administered 2018-10-08: 2 mg via INTRAVENOUS
  Filled 2018-10-08: qty 1

## 2018-10-08 MED ORDER — PIPERACILLIN-TAZOBACTAM 3.375 G IVPB 30 MIN
3.3750 g | Freq: Once | INTRAVENOUS | Status: AC
  Administered 2018-10-08: 3.375 g via INTRAVENOUS
  Filled 2018-10-08: qty 50

## 2018-10-08 MED ORDER — SODIUM CHLORIDE 0.9 % IV SOLN
INTRAVENOUS | Status: DC
  Administered 2018-10-08 – 2018-10-11 (×7): via INTRAVENOUS

## 2018-10-08 NOTE — H&P (View-Only) (Signed)
ORTHOPAEDIC CONSULTATION  REQUESTING PHYSICIAN: Shelly Coss, MD  Chief Complaint: Purulent ulceration swelling cellulitis left foot HPI: [name] is a 42 y.o. 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  Patient states that he has been drinking too much not controlling his diabetes and has been having purulent drainage for a period of time.   Past Medical History:  Diagnosis Date  . Anemia   . Bleeding    hx internal bleeding 1995 due to accident  . Blood transfusion   . Diabetes mellitus    Past Surgical History:  Procedure Laterality Date  . EXPLORATORY LAPAROTOMY     Social History   Socioeconomic History  . Marital status: Single    Spouse name: Not on file  . Number of children: Not on file  . Years of education: Not on file  . Highest education level: Not on file  Occupational History  . Not on file  Social Needs  . Financial resource strain: Not on file  . Food insecurity:    Worry: Not on file    Inability: Not on file  . Transportation needs:    Medical: Not on file    Non-medical: Not on file  Tobacco Use  . Smoking status: Former Smoker    Last attempt to quit: 04/17/1996    Years since quitting: 22.4  . Smokeless tobacco: Never Used  Substance and Sexual Activity  . Alcohol use: Yes    Comment: occ  . Drug use: No  . Sexual activity: Not on file  Lifestyle  . Physical activity:    Days per week: Not on file    Minutes per session: Not on file  . Stress: Not on file  Relationships  . Social connections:    Talks on phone: Not on file    Gets together: Not on file    Attends religious service: Not on file    Active member of club or organization: Not on file    Attends meetings of clubs or organizations: Not on file    Relationship status: Not on file  Other Topics Concern  . Not on file  Social History Narrative  . Not on file   Family History  Problem Relation Age of Onset  . Arthritis Mother   . Diabetes  Mellitus II Father    - negative except otherwise stated in the family history section No Known Allergies Prior to Admission medications   Medication Sig Start Date End Date Taking? Authorizing Provider  insulin aspart (NOVOLOG FLEXPEN) 100 UNIT/ML FlexPen Inject 1-10 Units into the skin 3 (three) times daily with meals. Per sliding scale.   Yes [provider]  Insulin Detemir (LEVEMIR FLEXTOUCH) 100 UNIT/ML Pen Inject 30 Units into the skin daily.   Yes [provider]  naproxen sodium (ALEVE) 220 MG tablet Take 220 mg by mouth daily as needed (headache).   Yes [provider]  blood glucose meter kit and supplies Dispense based on patient and insurance preference. Use up to four times daily as directed. (FOR ICD-10 E10.9, E11.9). 07/09/18   Mikhail, Velta Addison, DO   No results found. - pertinent xrays, CT, MRI studies were reviewed and independently interpreted  Positive ROS: All other systems have been reviewed and were otherwise negative with the exception of those mentioned in the HPI and as above.  Physical Exam: General: Alert, no acute distress Psychiatric: Patient is competent for consent with normal mood and affect Lymphatic: No  axillary or cervical lymphadenopathy Cardiovascular: No pedal edema Respiratory: No cyanosis, no use of accessory musculature GI: No organomegaly, abdomen is soft and non-tender    Images:  '@ENCIMAGES'$ @  Labs:  Lab Results  Component Value Date   HGBA1C 8.9 (H) 10/08/2018   HGBA1C 11.6 (H) 07/05/2018   HGBA1C 10.6 (H) 04/18/2012   ESRSEDRATE 102 (H) 07/05/2018   CRP 3.7 (H) 07/05/2018   REPTSTATUS 09/11/2018 FINAL 09/06/2018   GRAMSTAIN  09/06/2018    FEW WBC PRESENT, PREDOMINANTLY MONONUCLEAR FEW GRAM POSITIVE COCCI Performed at [facility], [place] [address] [place], [place] [address]    CULT  09/06/2018    FEW STAPHYLOCOCCUS AUREUS FEW CITROBACTER KOSERI    LABORGA STAPHYLOCOCCUS AUREUS 09/06/2018    LABORGA CITROBACTER KOSERI 09/06/2018    Lab Results  Component Value Date   ALBUMIN 3.3 (L) 07/06/2018   ALBUMIN 3.9 07/30/2012   ALBUMIN 2.3 (L) 04/19/2012   PREALBUMIN 12.8 (L) 07/05/2018    Neurologic: Patient does not have protective sensation bilateral lower extremities.   MUSCULOSKELETAL:   Skin: Examination patient has significant swelling of the left foot compared to the right.  There is cellulitis involving the hindfoot and midfoot on the left.  There are 2 purulent draining ulcers from the left foot 1 dorsally over the midfoot 1 plantarly over the base of the fifth metatarsal there is purulent drainage from both.  Patient has a palpable dorsalis pedis pulse.  Review of the MRI scan shows air in the soft tissue involving the midfoot and hindfoot as well as chronic osteomyelitis involving the fifth metatarsal.  There is no ascending cellulitis.  Patient has protein caloric malnutrition with an albumin of 3.3 and a hemoglobin A1c which is ranged from 11.62 currently 8.9.  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  Plan: Discussed with the patient the extensive soft tissue infection with osteomyelitis and purulent drainage and ulcerations over the dorsum of the midfoot and plantar aspect of the midfoot he does not have foot salvage options.  I recommended proceeding with a transtibial amputation on Wednesday.  Patient states he understands wished to proceed with surgery at this time.  Patient states he does not live in [place] he states he is not sure where he can go live after surgery and he states he does not have insurance and will need assistance with Medicaid and disability.  Thank you for the consult and the opportunity to see [name], [place] [phone] 6:25 PM

## 2018-10-08 NOTE — Progress Notes (Signed)
Pharmacy Antibiotic Note  [name] is a 42 y.o. male admitted on 10/08/2018 with  past medical history of type 1 diabetes, anemia, and alcohol abuse.  He presents today for evaluation of an abnormal MRI that shows osteomyelitis on his left foot.   Pharmacy has been consulted for vancomycin and zosyn dosing.  Plan: Vancomycin 1gm IV x 1 then 1500mg  q12h (AUC 493.1, Scr 0.81), will start in 8 hours in lieu of bolus Zosyn 3.375mg  IV over 30 minutes then 3.375mg  q8h EI Daily Scr Follow renal function, cultures and clinical course  Height: 6' (182.9 cm) Weight: 185 lb (83.9 kg) IBW/kg (Calculated) : 77.6  Temp (24hrs), Avg:99.7 F (37.6 C), Min:99.7 F (37.6 C), Max:99.7 F (37.6 C)  Recent Labs  Lab 10/06/18 1329 10/08/18 1135  WBC  --  8.2  CREATININE 0.70 0.81    Estimated Creatinine Clearance: 129.1 mL/min (by C-G formula based on SCr of 0.81 mg/dL).    No Known Allergies  Antimicrobials this admission: 11/11 vanc >> 11/11 zosyn >> Dose adjustments this admission:   Microbiology results: 11/11 BCx:   Thank you for allowing pharmacy to be a part of this patient's care.  Arley Phenix RPh 10/08/2018, 1:04 PM Pager [phone]

## 2018-10-08 NOTE — ED Notes (Signed)
Report given to Bee RN  

## 2018-10-08 NOTE — ED Notes (Signed)
Hospital phlebotomy at bedside. 

## 2018-10-08 NOTE — ED Notes (Signed)
ED TO INPATIENT HANDOFF REPORT  Name/Age/Gender [name] 42 y.o. male  Code Status    Code Status Orders  (From admission, onward)         Start     Ordered   10/08/18 1327  Full code  Continuous     10/08/18 1326        Code Status History    Date Active Date Inactive Code Status Order ID Comments User Context   07/05/2018 1542 07/09/2018 1543 Full Code [id number]  Reubin Milan, MD ED   04/18/2012 0954 04/19/2012 1452 Full Code [id number]  Ernest Haber, MD Inpatient      Home/SNF/Other Home  Chief Complaint left DM foot infection  Level of Care/Admitting Diagnosis ED Disposition    ED Disposition Condition [facility] Area: [place] [[id number]]  Level of Care: Med-Surg [16]  Diagnosis: Osteomyelitis [facility]) [[id number]]  Admitting Physician: Shelly Coss [[id number]]  Attending Physician: Shelly Coss [[id number]]  Estimated length of stay: past midnight tomorrow  Certification:: I certify this patient will need inpatient services for at least 2 midnights  PT Class (Do Not Modify): Inpatient [101]  PT Acc Code (Do Not Modify): Private [1]       Medical History Past Medical History:  Diagnosis Date  . Anemia   . Bleeding    hx internal bleeding 1995 due to accident  . Blood transfusion   . Diabetes mellitus     Allergies No Known Allergies  IV Location/Drains/Wounds Patient Lines/Drains/Airways Status   Active Line/Drains/Airways    Name:   Placement date:   Placement time:   Site:   Days:   Peripheral IV 10/08/18 Left Forearm   10/08/18    1121    Forearm   less than 1   Wound / Incision (Open or Dehisced) 07/05/18 Diabetic ulcer Toe (Comment  which one) Left   07/05/18    1615    Toe (Comment  which one)   95          Labs/Imaging Results for orders placed or performed during the hospital encounter of 10/08/18 (from the past 48 hour(s))  CBG monitoring, ED     Status: Abnormal   Collection Time: 10/08/18  10:12 AM  Result Value Ref Range   Glucose-Capillary 236 (H) 70 - 99 mg/dL  Basic metabolic panel     Status: Abnormal   Collection Time: 10/08/18 11:35 AM  Result Value Ref Range   Sodium 130 (L) 135 - 145 mmol/L   Potassium 3.7 3.5 - 5.1 mmol/L   Chloride 98 98 - 111 mmol/L   CO2 24 22 - 32 mmol/L   Glucose, Bld 238 (H) 70 - 99 mg/dL   BUN 10 6 - 20 mg/dL   Creatinine, Ser 0.81 0.61 - 1.24 mg/dL   Calcium 8.4 (L) 8.9 - 10.3 mg/dL   GFR calc non Af Amer >60 >60 mL/min   GFR calc Af Amer >60 >60 mL/min    Comment: (NOTE) The eGFR has been calculated using the CKD EPI equation. This calculation has not been validated in all clinical situations. eGFR's persistently <60 mL/min signify possible Chronic Kidney Disease.    Anion gap 8 5 - 15    Comment: Performed at [facility], [place] [address], [place], [place] [address]  CBC with Differential     Status: Abnormal   Collection Time: 10/08/18 11:35 AM  Result Value Ref Range   WBC 8.2 4.0 -  10.5 K/uL   RBC 3.04 (L) 4.22 - 5.81 MIL/uL   Hemoglobin 10.3 (L) 13.0 - 17.0 g/dL   HCT 30.7 (L) 39.0 - 52.0 %   MCV 101.0 (H) 80.0 - 100.0 fL   MCH 33.9 26.0 - 34.0 pg   MCHC 33.6 30.0 - 36.0 g/dL   RDW 13.1 11.5 - 15.5 %   Platelets 290 150 - 400 K/uL   nRBC 0.0 0.0 - 0.2 %   Neutrophils Relative % 80 %   Neutro Abs 6.6 1.7 - 7.7 K/uL   Lymphocytes Relative 9 %   Lymphs Abs 0.7 0.7 - 4.0 K/uL   Monocytes Relative 10 %   Monocytes Absolute 0.8 0.1 - 1.0 K/uL   Eosinophils Relative 0 %   Eosinophils Absolute 0.0 0.0 - 0.5 K/uL   Basophils Relative 0 %   Basophils Absolute 0.0 0.0 - 0.1 K/uL   Immature Granulocytes 1 %   Abs Immature Granulocytes 0.04 0.00 - 0.07 K/uL    Comment: Performed at [facility], [place] [address] [place], [place] [address]   No results found. None  Pending Labs Unresulted Labs (From admission, onward)    Start     Ordered   10/09/18 0500  Creatinine, serum  Daily,    R     10/08/18 1316   10/09/18 8119  Basic metabolic panel  Tomorrow morning,   R     10/08/18 1326   10/09/18 0500  CBC  Tomorrow morning,   R     10/08/18 1326   10/08/18 1326  HIV antibody (Routine Testing)  Once,   R     10/08/18 1326   10/08/18 1324  Hemoglobin A1c  Add-on,   R     10/08/18 1323   10/08/18 1257  Culture, blood (routine x 2)  BLOOD CULTURE X 2,   R     10/08/18 1256          Vitals/Pain Today's Vitals   10/08/18 0950 10/08/18 1007 10/08/18 1306  BP:  120/83 (!) 139/91  Pulse:  94 74  Resp:  20 18  Temp:  99.7 F (37.6 C) 98.6 F (37 C)  TempSrc:  Oral   SpO2:  99% 98%  Weight:  83.9 kg   Height:  6' (1.829 m)   PainSc: 1   0-No pain    Isolation Precautions No active isolations  Medications Medications  piperacillin-tazobactam (ZOSYN) IVPB 3.375 g (3.375 g Intravenous New Bag/Given 10/08/18 1504)  vancomycin (VANCOCIN) 1,500 mg in sodium chloride 0.9 % 500 mL IVPB (has no administration in time range)  piperacillin-tazobactam (ZOSYN) IVPB 3.375 g (has no administration in time range)  morphine 2 MG/ML injection 2 mg (has no administration in time range)  0.9 %  sodium chloride infusion ( Intravenous New Bag/Given 10/08/18 1504)  insulin detemir (LEVEMIR) injection 30 Units (has no administration in time range)  enoxaparin (LOVENOX) injection 40 mg (has no administration in time range)  insulin aspart (novoLOG) injection 0-15 Units (has no administration in time range)  insulin aspart (novoLOG) injection 0-5 Units (has no administration in time range)  sodium chloride 0.9 % bolus 1,000 mL (0 mLs Intravenous Stopped 10/08/18 1231)  vancomycin (VANCOCIN) IVPB 1000 mg/200 mL premix (0 mg Intravenous Stopped 10/08/18 1231)    Mobility walks

## 2018-10-08 NOTE — ED Triage Notes (Addendum)
Patient c/o of left DM foot infection for a couple weeks. MRI Saturday showing infection to the bone per patient. Patients doctor sent him over for IV antibiotics per patient.   Hx. DM type 2.

## 2018-10-08 NOTE — Consult Note (Signed)
ORTHOPAEDIC CONSULTATION  REQUESTING PHYSICIAN: Shelly Coss, MD  Chief Complaint: Purulent ulceration swelling cellulitis left foot HPI: [name] is a 42 y.o. 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  Patient states that he has been drinking too much not controlling his diabetes and has been having purulent drainage for a period of time.   Past Medical History:  Diagnosis Date  . Anemia   . Bleeding    hx internal bleeding 1995 due to accident  . Blood transfusion   . Diabetes mellitus    Past Surgical History:  Procedure Laterality Date  . EXPLORATORY LAPAROTOMY     Social History   Socioeconomic History  . Marital status: Single    Spouse name: Not on file  . Number of children: Not on file  . Years of education: Not on file  . Highest education level: Not on file  Occupational History  . Not on file  Social Needs  . Financial resource strain: Not on file  . Food insecurity:    Worry: Not on file    Inability: Not on file  . Transportation needs:    Medical: Not on file    Non-medical: Not on file  Tobacco Use  . Smoking status: Former Smoker    Last attempt to quit: 04/17/1996    Years since quitting: 22.4  . Smokeless tobacco: Never Used  Substance and Sexual Activity  . Alcohol use: Yes    Comment: occ  . Drug use: No  . Sexual activity: Not on file  Lifestyle  . Physical activity:    Days per week: Not on file    Minutes per session: Not on file  . Stress: Not on file  Relationships  . Social connections:    Talks on phone: Not on file    Gets together: Not on file    Attends religious service: Not on file    Active member of club or organization: Not on file    Attends meetings of clubs or organizations: Not on file    Relationship status: Not on file  Other Topics Concern  . Not on file  Social History Narrative  . Not on file   Family History  Problem Relation Age of Onset  . Arthritis Mother   . Diabetes  Mellitus II Father    - negative except otherwise stated in the family history section No Known Allergies Prior to Admission medications   Medication Sig Start Date End Date Taking? Authorizing Provider  insulin aspart (NOVOLOG FLEXPEN) 100 UNIT/ML FlexPen Inject 1-10 Units into the skin 3 (three) times daily with meals. Per sliding scale.   Yes [provider]  Insulin Detemir (LEVEMIR FLEXTOUCH) 100 UNIT/ML Pen Inject 30 Units into the skin daily.   Yes [provider]  naproxen sodium (ALEVE) 220 MG tablet Take 220 mg by mouth daily as needed (headache).   Yes [provider]  blood glucose meter kit and supplies Dispense based on patient and insurance preference. Use up to four times daily as directed. (FOR ICD-10 E10.9, E11.9). 07/09/18   Mikhail, Velta Addison, DO   No results found. - pertinent xrays, CT, MRI studies were reviewed and independently interpreted  Positive ROS: All other systems have been reviewed and were otherwise negative with the exception of those mentioned in the HPI and as above.  Physical Exam: General: Alert, no acute distress Psychiatric: Patient is competent for consent with normal mood and affect Lymphatic: No  axillary or cervical lymphadenopathy Cardiovascular: No pedal edema Respiratory: No cyanosis, no use of accessory musculature GI: No organomegaly, abdomen is soft and non-tender    Images:  '@ENCIMAGES'$ @  Labs:  Lab Results  Component Value Date   HGBA1C 8.9 (H) 10/08/2018   HGBA1C 11.6 (H) 07/05/2018   HGBA1C 10.6 (H) 04/18/2012   ESRSEDRATE 102 (H) 07/05/2018   CRP 3.7 (H) 07/05/2018   REPTSTATUS 09/11/2018 FINAL 09/06/2018   GRAMSTAIN  09/06/2018    FEW WBC PRESENT, PREDOMINANTLY MONONUCLEAR FEW GRAM POSITIVE COCCI Performed at [facility], [place] [address] [place], [place] [address]    CULT  09/06/2018    FEW STAPHYLOCOCCUS AUREUS FEW CITROBACTER KOSERI    LABORGA STAPHYLOCOCCUS AUREUS 09/06/2018    LABORGA CITROBACTER KOSERI 09/06/2018    Lab Results  Component Value Date   ALBUMIN 3.3 (L) 07/06/2018   ALBUMIN 3.9 07/30/2012   ALBUMIN 2.3 (L) 04/19/2012   PREALBUMIN 12.8 (L) 07/05/2018    Neurologic: Patient does not have protective sensation bilateral lower extremities.   MUSCULOSKELETAL:   Skin: Examination patient has significant swelling of the left foot compared to the right.  There is cellulitis involving the hindfoot and midfoot on the left.  There are 2 purulent draining ulcers from the left foot 1 dorsally over the midfoot 1 plantarly over the base of the fifth metatarsal there is purulent drainage from both.  Patient has a palpable dorsalis pedis pulse.  Review of the MRI scan shows air in the soft tissue involving the midfoot and hindfoot as well as chronic osteomyelitis involving the fifth metatarsal.  There is no ascending cellulitis.  Patient has protein caloric malnutrition with an albumin of 3.3 and a hemoglobin A1c which is ranged from 11.62 currently 8.9.  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  Plan: Discussed with the patient the extensive soft tissue infection with osteomyelitis and purulent drainage and ulcerations over the dorsum of the midfoot and plantar aspect of the midfoot he does not have foot salvage options.  I recommended proceeding with a transtibial amputation on Wednesday.  Patient states he understands wished to proceed with surgery at this time.  Patient states he does not live in [place] he states he is not sure where he can go live after surgery and he states he does not have insurance and will need assistance with Medicaid and disability.  Thank you for the consult and the opportunity to see Mr. Brennin Durfee, [place] [phone] 6:25 PM

## 2018-10-08 NOTE — ED Provider Notes (Signed)
[facility] DEPT Provider Note   CSN: [id number] Arrival date & time: 10/08/18  7829     History   Chief Complaint Chief Complaint  Patient presents with  . Foot Pain    DM infection    HPI [name] is a 42 y.o. male.  Patient is a 42 year old male with past medical history of type 1 diabetes, anemia, and alcohol abuse.  He presents today for evaluation of an abnormal MRI on his left foot.  He has had pain, swelling, and drainage from his left foot for the past week.  He was seen by his primary doctor who ordered an MRI.  This was performed on the ninth and shows osteomyelitis of the fifth metatarsal and cuboid bone with abscess formation and tenosynovitis of the extensor tendons.  He was told by his primary doctor to come here for IV antibiotics and admission.  Patient denies fevers or chills.  He denies other complaints.  The history is provided by the patient.  Foot Pain  This is a recurrent problem. Episode onset: 5 days ago. The problem occurs constantly. The problem has been rapidly worsening. Nothing aggravates the symptoms. Nothing relieves the symptoms. He has tried nothing for the symptoms.    Past Medical History:  Diagnosis Date  . Anemia   . Bleeding    hx internal bleeding 1995 due to accident  . Blood transfusion   . Diabetes mellitus     Patient Active Problem List   Diagnosis Date Noted  . Diabetic ulcer of left foot ([place]) 07/05/2018  . Uncontrolled diabetes mellitus ([place]) 07/05/2018  . Elevated MCV 07/05/2018  . Transaminitis 04/18/2012  . Hyponatremia 04/18/2012  . Anemia 04/18/2012  . Alcohol abuse 04/18/2012  . Hypokalemia 04/18/2012  . Pancytopenia 04/18/2012  . Neuropathy 04/18/2012  . DIABETES MELLITUS, TYPE II 08/16/2010  . DKA 08/16/2010  . PANCREATITIS 08/16/2010    Past Surgical History:  Procedure Laterality Date  . EXPLORATORY LAPAROTOMY          Home Medications    Prior to Admission  medications   Medication Sig Start Date End Date Taking? Authorizing Provider  ciprofloxacin (CIPRO) 500 MG tablet Take 1 tablet by mouth 2 (two) times daily. 10/04/18  Yes [provider]  insulin aspart (NOVOLOG FLEXPEN) 100 UNIT/ML FlexPen Inject 1-10 Units into the skin 3 (three) times daily with meals. Per sliding scale.   Yes [provider]  Insulin Detemir (LEVEMIR FLEXTOUCH) 100 UNIT/ML Pen Inject 30 Units into the skin daily.   Yes [provider]  naproxen sodium (ALEVE) 220 MG tablet Take 220 mg by mouth daily as needed (headache).   Yes [provider]  blood glucose meter kit and supplies Dispense based on patient and insurance preference. Use up to four times daily as directed. (FOR ICD-10 E10.9, E11.9). 07/09/18   Mikhail, Velta Addison, DO  doxycycline (VIBRA-TABS) 100 MG tablet Take 1 tablet (100 mg total) by mouth 2 (two) times daily. Patient not taking: Reported on 10/08/2018 07/09/18   Cristal Ford, DO  vitamin B-12 (CYANOCOBALAMIN) 500 MCG tablet Take 1 tablet (500 mcg total) by mouth daily. Patient not taking: Reported on 10/08/2018 07/09/18   Cristal Ford, DO    Family History Family History  Problem Relation Age of Onset  . Arthritis Mother   . Diabetes Mellitus II Father     Social History Social History   Tobacco Use  . Smoking status: Former Smoker    Last attempt  to quit: 04/17/1996    Years since quitting: 22.4  . Smokeless tobacco: Never Used  Substance Use Topics  . Alcohol use: Yes    Comment: occ  . Drug use: No     Allergies   Patient has no known allergies.   Review of Systems Review of Systems  All other systems reviewed and are negative.    Physical Exam Updated Vital Signs BP 120/83 (BP Location: Right Arm)   Pulse 94   Temp 99.7 F (37.6 C) (Oral)   Resp 20   Ht 6' (1.829 m)   Wt 83.9 kg   SpO2 99%   BMI 25.09 kg/m   Physical Exam  Constitutional: He is oriented to person, place, and  time. He appears well-developed and well-nourished. No distress.  HENT:  Head: Normocephalic and atraumatic.  Mouth/Throat: Oropharynx is clear and moist.  Neck: Normal range of motion. Neck supple.  Cardiovascular: Normal rate and regular rhythm. Exam reveals no friction rub.  No murmur heard. Pulmonary/Chest: Effort normal and breath sounds normal. No respiratory distress. He has no wheezes. He has no rales.  Abdominal: Soft. Bowel sounds are normal. He exhibits no distension. There is no tenderness.  Musculoskeletal: Normal range of motion. He exhibits no edema.  The left foot has a open sore to the lateral aspect that is draining purulent and serous material.  There is warmth and swelling in this area.  Neurological: He is alert and oriented to person, place, and time. Coordination normal.  Skin: Skin is warm and dry. He is not diaphoretic.  Nursing note and vitals reviewed.    ED Treatments / Results  Labs (all labs ordered are listed, but only abnormal results are displayed) Labs Reviewed  CBG MONITORING, ED - Abnormal; Notable for the following components:      Result Value   Glucose-Capillary 236 (*)    All other components within normal limits  BASIC METABOLIC PANEL  CBC WITH DIFFERENTIAL/PLATELET    EKG None  Radiology Mr Foot Left W Wo Contrast  Result Date: 10/07/2018 CLINICAL DATA:  Diabetic foot ulcer. Pain and swelling on the lateral aspect of the foot. EXAM: MRI OF THE LEFT FOREFOOT WITHOUT AND WITH CONTRAST TECHNIQUE: Multiplanar, multisequence MR imaging of the left foot was performed both before and after administration of intravenous contrast. CONTRAST:  10 cc Gadavist COMPARISON:  Radiographs 09/29/2018 FINDINGS: There is an open wound on the lateral aspect of the foot which extends right down to the bone of the base of the fifth metatarsal. There is an adjacent curvilinear soft tissue abscess measuring a maximum of 3 cm. Severe diffuse surrounding cellulitis.  A second small rim enhancing fluid collection is closely associated with the lateral extensor tendons and is suspicious for septic tenosynovitis. Abnormal signal intensity and enhancement in the entire fifth metatarsal consistent with osteomyelitis. There is also a focus of osteomyelitis involving the cuboid laterally. I do not see any other obvious foci of osteomyelitis. Diffuse and severe cellulitis and myofasciitis without definite findings for pyomyositis. IMPRESSION: 1. Open wound on the lateral aspect of the foot extends right down to the bone of the base of the fifth metatarsal. Diffuse osteomyelitis involving the fifth metatarsal and also the adjacent cuboid. 2. 3 cm soft tissue abscess near the open wound and findings suspicious for septic tenosynovitis involving the extensor tendons. 3. Severe cellulitis and myofasciitis without definite findings for pyomyositis. Electronically Signed   By: Marijo Sanes M.D.   On: 10/07/2018 02:06  Procedures Procedures (including critical care time)  Medications Ordered in ED Medications  sodium chloride 0.9 % bolus 1,000 mL (has no administration in time range)  vancomycin (VANCOCIN) IVPB 1000 mg/200 mL premix (has no administration in time range)     Initial Impression / Assessment and Plan / ED Course  I have reviewed the triage vital signs and the nursing notes.  Pertinent labs & imaging results that were available during my care of the patient were reviewed by me and considered in my medical decision making (see chart for details).  Patient presents with osteomyelitis of his left foot.  The MRI performed as an outpatient was discussed with Dr. Sharol Given from orthopedic surgery.  He is requesting the patient be admitted to [facility] where he has operating room time.  I have spoken with the hospitalist who agrees to admit.  Final Clinical Impressions(s) / ED Diagnoses   Final diagnoses:  None    ED Discharge Orders    None       Veryl Speak, MD 10/08/18 1552

## 2018-10-08 NOTE — ED Notes (Signed)
Pt had antibiotics before blood cultures were ordered

## 2018-10-08 NOTE — H&P (Addendum)
History and Physical    [name] MWN:[id number] DOB: [birth year] DOA: 10/08/2018  PCP: System, Pcp Not In   Patient coming from: Home   Chief Complaint: Left foot ulcer  HPI: [name] is a 42 y.o. male with medical history significant of diabetes mellitus type 2, on insulin who presents from home to the emergency department with complaints of persistent left foot ulcer and as per referral by his PCP.  Patient developed diabetic foot ulcer on his left side about 2 months ago.  He was following with his PCP and wound care physician.  He was on doxycycline and ciprofloxacin at home.  He underwent MRI of the left foot which showed osteomyelitis.  And his primary doctor referred him to the emergency department. Patient reported that he was having persistent pain, swelling, foul-smelling drainage from his left foot over the past week.  Also reported of fever and chills at home. Patient seen and examined the bedside in the emergency department.  Currently he is hemodynamically stable.  He denies any chest pain, shortness of breath, palpitation, abdominal pain, dysuria, nausea, vomiting, diarrhea or headache. He says he is compliant with his insulin regimen.   ED Course: Started on broad-spectrum antibiotics.  Blood culture sent.  Orthopedics, ID consults. .    Past Medical History:  Diagnosis Date  . Anemia   . Bleeding    hx internal bleeding 1995 due to accident  . Blood transfusion   . Diabetes mellitus     Past Surgical History:  Procedure Laterality Date  . EXPLORATORY LAPAROTOMY       reports that he quit smoking about 22 years ago. He has never used smokeless tobacco. He reports that he drinks alcohol. He reports that he does not use drugs.  No Known Allergies  Family History  Problem Relation Age of Onset  . Arthritis Mother   . Diabetes Mellitus II Father      Prior to Admission medications   Medication Sig Start Date End Date Taking? Authorizing Provider    insulin aspart (NOVOLOG FLEXPEN) 100 UNIT/ML FlexPen Inject 1-10 Units into the skin 3 (three) times daily with meals. Per sliding scale.   Yes [provider]  Insulin Detemir (LEVEMIR FLEXTOUCH) 100 UNIT/ML Pen Inject 30 Units into the skin daily.   Yes [provider]  naproxen sodium (ALEVE) 220 MG tablet Take 220 mg by mouth daily as needed (headache).   Yes [provider]  blood glucose meter kit and supplies Dispense based on patient and insurance preference. Use up to four times daily as directed. (FOR ICD-10 E10.9, E11.9). 07/09/18   Cristal Ford, DO    Physical Exam: Vitals:   10/08/18 1007 10/08/18 1306  BP: 120/83 (!) 139/91  Pulse: 94 74  Resp: 20 18  Temp: 99.7 F (37.6 C) 98.6 F (37 C)  TempSrc: Oral   SpO2: 99% 98%  Weight: 83.9 kg   Height: 6' (1.829 m)     Constitutional: NAD, calm, comfortable Vitals:   10/08/18 1007 10/08/18 1306  BP: 120/83 (!) 139/91  Pulse: 94 74  Resp: 20 18  Temp: 99.7 F (37.6 C) 98.6 F (37 C)  TempSrc: Oral   SpO2: 99% 98%  Weight: 83.9 kg   Height: 6' (1.829 m)    Eyes: PERRL, lids and conjunctivae normal ENMT: Mucous membranes are moist. Posterior pharynx clear of any exudate or lesions.Normal dentition.  Neck: normal, supple, no masses, no thyromegaly Respiratory: clear to auscultation bilaterally, no wheezing,  no crackles. Normal respiratory effort. No accessory muscle use.  Cardiovascular: Regular rate and rhythm, no murmurs / rubs / gallops. No carotid bruits.  Abdomen: no tenderness, no masses palpated. No hepatosplenomegaly. Bowel sounds positive.  Musculoskeletal: no clubbing / cyanosis. No joint deformity upper and lower extremities. Good ROM, no contractures. Normal muscle tone.  Foul-smelling , abscess draining ulcer on the plantar aspect of the left foot.  Adjacent swelling and tenderness Skin: no rashes, lesions, ulcers. No induration Neurologic: CN 2-12 grossly intact. Sensation  intact, DTR normal. Strength 5/5 in all 4.  Psychiatric: Normal judgment and insight. Alert and oriented x 3. Normal mood.   Foley Catheter:None  Labs on Admission: I have personally reviewed following labs and imaging studies  CBC: Recent Labs  Lab 10/08/18 1135  WBC 8.2  NEUTROABS 6.6  HGB 10.3*  HCT 30.7*  MCV 101.0*  PLT 976   Basic Metabolic Panel: Recent Labs  Lab 10/06/18 1329 10/08/18 1135  NA  --  130*  K  --  3.7  CL  --  98  CO2  --  24  GLUCOSE  --  238*  BUN  --  10  CREATININE 0.70 0.81  CALCIUM  --  8.4*   GFR: Estimated Creatinine Clearance: 129.1 mL/min (by C-G formula based on SCr of 0.81 mg/dL). Liver Function Tests: No results for input(s): AST, ALT, ALKPHOS, BILITOT, PROT, ALBUMIN in the last 168 hours. No results for input(s): LIPASE, AMYLASE in the last 168 hours. No results for input(s): AMMONIA in the last 168 hours. Coagulation Profile: No results for input(s): INR, PROTIME in the last 168 hours. Cardiac Enzymes: No results for input(s): CKTOTAL, CKMB, CKMBINDEX, TROPONINI in the last 168 hours. BNP (last 3 results) No results for input(s): PROBNP in the last 8760 hours. HbA1C: No results for input(s): HGBA1C in the last 72 hours. CBG: Recent Labs  Lab 10/08/18 1012  GLUCAP 236*   Lipid Profile: No results for input(s): CHOL, HDL, LDLCALC, TRIG, CHOLHDL, LDLDIRECT in the last 72 hours. Thyroid Function Tests: No results for input(s): TSH, T4TOTAL, FREET4, T3FREE, THYROIDAB in the last 72 hours. Anemia Panel: No results for input(s): VITAMINB12, FOLATE, FERRITIN, TIBC, IRON, RETICCTPCT in the last 72 hours. Urine analysis:    Component Value Date/Time   COLORURINE AMBER (A) 04/17/2012 1812   APPEARANCEUR CLEAR 04/17/2012 1812   LABSPEC 1.016 04/17/2012 1812   PHURINE 8.0 04/17/2012 1812   GLUCOSEU 100 (A) 04/17/2012 1812   HGBUR NEGATIVE 04/17/2012 1812   BILIRUBINUR NEGATIVE 04/17/2012 1812   KETONESUR NEGATIVE 04/17/2012  1812   PROTEINUR NEGATIVE 04/17/2012 1812   UROBILINOGEN 1.0 04/17/2012 1812   NITRITE NEGATIVE 04/17/2012 1812   LEUKOCYTESUR NEGATIVE 04/17/2012 1812    Radiological Exams on Admission: Mr Foot Left W Wo Contrast  Result Date: 10/07/2018 CLINICAL DATA:  Diabetic foot ulcer. Pain and swelling on the lateral aspect of the foot. EXAM: MRI OF THE LEFT FOREFOOT WITHOUT AND WITH CONTRAST TECHNIQUE: Multiplanar, multisequence MR imaging of the left foot was performed both before and after administration of intravenous contrast. CONTRAST:  10 cc Gadavist COMPARISON:  Radiographs 09/29/2018 FINDINGS: There is an open wound on the lateral aspect of the foot which extends right down to the bone of the base of the fifth metatarsal. There is an adjacent curvilinear soft tissue abscess measuring a maximum of 3 cm. Severe diffuse surrounding cellulitis. A second small rim enhancing fluid collection is closely associated with the lateral extensor tendons and is  suspicious for septic tenosynovitis. Abnormal signal intensity and enhancement in the entire fifth metatarsal consistent with osteomyelitis. There is also a focus of osteomyelitis involving the cuboid laterally. I do not see any other obvious foci of osteomyelitis. Diffuse and severe cellulitis and myofasciitis without definite findings for pyomyositis. IMPRESSION: 1. Open wound on the lateral aspect of the foot extends right down to the bone of the base of the fifth metatarsal. Diffuse osteomyelitis involving the fifth metatarsal and also the adjacent cuboid. 2. 3 cm soft tissue abscess near the open wound and findings suspicious for septic tenosynovitis involving the extensor tendons. 3. Severe cellulitis and myofasciitis without definite findings for pyomyositis. Electronically Signed   By: Marijo Sanes M.D.   On: 10/07/2018 02:06     Assessment/Plan Principal Problem:   Diabetic ulcer of left foot ([place]) Active Problems:   Type 2 diabetes mellitus  with foot ulcer ([place])   Hyponatremia   Alcohol abuse   Osteomyelitis ([facility])   Foot ulcer ([place])  Diabetic foot ulcer/osteomyelitis: MRI reported as Open wound on the lateral aspect of the foot extends right down to the bone of the base of the fifth metatarsal. Diffuse osteomyelitis involving the fifth metatarsal and also the adjacent cuboid.3 cm soft tissue abscess near the open wound and findings suspicious for septic tenosynovitis involving the extensor tendons.Severe cellulitis and myofasciitis without definite findings for pyomyositis.  We have requested  consult for Dr. Sharol Given.  He needs debridement and possible amputation.Dr Sharol Given wants him  to be transferred to [facility]. We have sent blood cultures.  We started on vancomycin and Zosyn for now. Regarding ID consultation , we can wait until patient undergoes debridement.  Follow-up cultures, wound culture or possible bone culture. Continue pain management.  Type 2 diabetes mellitus: Patient says he is compliant with his insulin regimen.  Will check hemoglobin A1C level.  Continue insulin.  Hyponatremia: Start on gentle IV fluids.  Anticipate improvement.  Alcohol abuse: Past history of alcohol abuse.  Patient says he does not drink currently.    Severity of Illness: The appropriate patient status for this patient is INPATIENT.    DVT prophylaxis: LOvenox Code Status: Full Family Communication: Family member present at the bedside Consults called: ED called orthopedics     Shelly Coss MD [facility] Pager [phone]  If 7PM-7AM, please contact night-coverage [email or web address] Password [email or web address]  10/08/2018, 1:27 PM

## 2018-10-08 NOTE — ED Notes (Signed)
Per Dr Roxan Hockey, patient has an infection to left foot-MRI completed-patient needs IV antibiotics, surgical consult and admission

## 2018-10-09 ENCOUNTER — Ambulatory Visit (INDEPENDENT_AMBULATORY_CARE_PROVIDER_SITE_OTHER): Payer: Self-pay | Admitting: Physician Assistant

## 2018-10-09 LAB — GLUCOSE, CAPILLARY
GLUCOSE-CAPILLARY: 194 mg/dL — AB (ref 70–99)
Glucose-Capillary: 91 mg/dL (ref 70–99)
Glucose-Capillary: 103 mg/dL — ABNORMAL HIGH (ref 70–99)
Glucose-Capillary: 104 mg/dL — ABNORMAL HIGH (ref 70–99)

## 2018-10-09 LAB — BASIC METABOLIC PANEL
Anion gap: 7 (ref 5–15)
BUN: 5 mg/dL — AB (ref 6–20)
CHLORIDE: 105 mmol/L (ref 98–111)
CO2: 24 mmol/L (ref 22–32)
Calcium: 8.5 mg/dL — ABNORMAL LOW (ref 8.9–10.3)
Creatinine, Ser: 0.78 mg/dL (ref 0.61–1.24)
GFR calc Af Amer: >60 mL/min (ref >60)
GFR calc non Af Amer: >60 mL/min (ref >60)
Glucose, Bld: 70 mg/dL (ref 70–99)
POTASSIUM: 3.5 mmol/L (ref 3.5–5.1)
SODIUM: 136 mmol/L (ref 135–145)

## 2018-10-09 LAB — CBC
HEMATOCRIT: 30.3 % — AB (ref 39.0–52.0)
Hemoglobin: 10.3 g/dL — ABNORMAL LOW (ref 13.0–17.0)
MCH: 33.3 pg (ref 26.0–34.0)
MCHC: 34 g/dL (ref 30.0–36.0)
MCV: 98.1 fL (ref 80.0–100.0)
Platelets: 277 10*3/uL (ref 150–400)
RBC: 3.09 MIL/uL — ABNORMAL LOW (ref 4.22–5.81)
RDW: 13.2 % (ref 11.5–15.5)
WBC: 5.7 10*3/uL (ref 4.0–10.5)
nRBC: 0 % (ref 0.0–0.2)

## 2018-10-09 LAB — HIV ANTIBODY (ROUTINE TESTING W REFLEX): HIV Screen 4th Generation wRfx: NONREACTIVE

## 2018-10-09 MED ORDER — CHLORHEXIDINE GLUCONATE 4 % EX LIQD
60.0000 mL | Freq: Once | CUTANEOUS | Status: AC
  Administered 2018-10-10: 4 via TOPICAL
  Filled 2018-10-09: qty 60

## 2018-10-09 NOTE — Progress Notes (Addendum)
Patient ambulated with PT and I was called into the room because was nauseated. did not vomit. Vitals done and within normal limits.Ginger ale soda given per request.

## 2018-10-09 NOTE — Progress Notes (Signed)
PROGRESS NOTE                                                                                                                                                                                                             Patient Demographics:    [name], is a 42 y.o. male, DOB - 30-Nov-1975, ZOX:[id number]  Admit date - 10/08/2018   Admitting Physician Burnadette Pop, MD  Outpatient Primary MD for the patient is System, Pcp Not In  LOS - 1   Chief Complaint  Patient presents with  . Foot Pain    DM infection       Brief Narrative   42 year old male with past medical controlled, presents with left foot infection, MRI significant for infection/osteomyelitis, seen by orthopedic, with recommendation for transtibial amputation.    Subjective:    Joel Cowin today has, No headache, No chest pain, No abdominal pain -fever or chills.   Assessment  & Plan :    Principal Problem:   Diabetic ulcer of left foot (HCC) Active Problems:   Type 2 diabetes mellitus with foot ulcer (HCC)   Hyponatremia   Alcohol abuse   Osteomyelitis (HCC)   Foot ulcer (HCC)  Diabetic foot ulcer/osteomyelitis:  MRI reported as Open wound on the lateral aspect of the foot extends right down to the bone of the base of the fifth metatarsal. Diffuse osteomyelitis involving the fifth metatarsal and also the adjacent cuboid.3 cm soft tissue abscess near the open wound and findings suspicious for septic tenosynovitis involving the extensor tendons.Severe cellulitis and myofasciitis without definite findings for pyomyositis.  -Orthopedic consult greatly appreciated, patient does have extensive soft tissue infection, with osteomyelitis, and purulent drainage, and ulceration over the dorsum of the midfoot and plantar aspect of the midfoot, per Alice Rieger unfortunately he has no salvage options, recommendation is for transtibial amputation, patient agreeable to proceed, plan for  surgery on Wednesday -Continue with IV vancomycin and Zosyn for now, need to see after surgery, continue with PRN pain meds  Type 2 diabetes mellitus uncontrolled: - Is compliant with insulin regimen, his A1c once he is elevated at 8.9, but actually down 11.6 three month ago.  Hyponatremia:  - improving with IV fluids  Alcohol abuse: Past history of alcohol abuse.  Patient says he does not drink currently.     Code Status :  Full  Family Communication  : fiance at bedsice  Disposition Plan  : pending PT evaluation after surgery  Consults  :  Orthopedic Dr Lajoyce Corners  Procedures  : None  DVT Prophylaxis  :  lovenox  Lab Results  Component Value Date   PLT 277 10/09/2018    Antibiotics  :    Anti-infectives (From admission, onward)   Start     Dose/Rate Route Frequency Ordered Stop   10/08/18 2200  piperacillin-tazobactam (ZOSYN) IVPB 3.375 g     3.375 g 12.5 mL/hr over 240 Minutes Intravenous Every 8 hours 10/08/18 1316     10/08/18 2000  vancomycin (VANCOCIN) 1,500 mg in sodium chloride 0.9 % 500 mL IVPB     1,500 mg 250 mL/hr over 120 Minutes Intravenous Every 12 hours 10/08/18 1316     10/08/18 1300  piperacillin-tazobactam (ZOSYN) IVPB 3.375 g     3.375 g 100 mL/hr over 30 Minutes Intravenous  Once 10/08/18 1259 10/08/18 1540   10/08/18 1045  vancomycin (VANCOCIN) IVPB 1000 mg/200 mL premix     1,000 mg 200 mL/hr over 60 Minutes Intravenous  Once 10/08/18 1031 10/08/18 1231        Objective:   Vitals:   10/08/18 1658 10/08/18 2219 10/09/18 0622 10/09/18 1255  BP: 140/90 (!) 145/89 (!) 153/89 (!) 146/85  Pulse: 78 67 (!) 58 60  Resp: 18 18 12    Temp: 99.5 F (37.5 C) 98.9 F (37.2 C) 98.2 F (36.8 C)   TempSrc: Oral Oral    SpO2: 100% 100% 100% 98%  Weight: 84 kg     Height: 6' (1.829 m)       Wt Readings from Last 3 Encounters:  10/08/18 84 kg  07/05/18 97.5 kg  04/18/12 70.2 kg     Intake/Output Summary (Last 24 hours) at 10/09/2018  1439 Last data filed at 10/09/2018 1324 Gross per 24 hour  Intake 1388.61 ml  Output 600 ml  Net 788.61 ml     Physical Exam  Awake Alert, Oriented X 3, No new F.N deficits, Normal affect Symmetrical Chest wall movement, Good air movement bilaterally, CTAB RRR,No Gallops,Rubs or new Murmurs, No Parasternal Heave +ve B.Sounds, Abd Soft, No tenderness,  No rebound - guarding or rigidity. Left lower extremity with swelling, foot with lateral wound with foul-smelling odor    Data Review:    CBC Recent Labs  Lab 10/08/18 1135 10/09/18 0337  WBC 8.2 5.7  HGB 10.3* 10.3*  HCT 30.7* 30.3*  PLT 290 277  MCV 101.0* 98.1  MCH 33.9 33.3  MCHC 33.6 34.0  RDW 13.1 13.2  LYMPHSABS 0.7  --   MONOABS 0.8  --   EOSABS 0.0  --   BASOSABS 0.0  --     Chemistries  Recent Labs  Lab 10/06/18 1329 10/08/18 1135 10/09/18 0337  NA  --  130* 136  K  --  3.7 3.5  CL  --  98 105  CO2  --  24 24  GLUCOSE  --  238* 70  BUN  --  10 5*  CREATININE 0.70 0.81 0.78  CALCIUM  --  8.4* 8.5*   ------------------------------------------------------------------------------------------------------------------ No results for input(s): CHOL, HDL, LDLCALC, TRIG, CHOLHDL, LDLDIRECT in the last 72 hours.  Lab Results  Component Value Date   HGBA1C 8.9 (H) 10/08/2018   ------------------------------------------------------------------------------------------------------------------ No results for input(s): TSH, T4TOTAL, T3FREE, THYROIDAB in the last 72 hours.  Invalid input(s): FREET3 ------------------------------------------------------------------------------------------------------------------ No results for input(s): VITAMINB12, FOLATE,  FERRITIN, TIBC, IRON, RETICCTPCT in the last 72 hours.  Coagulation profile No results for input(s): INR, PROTIME in the last 168 hours.  No results for input(s): DDIMER in the last 72 hours.  Cardiac Enzymes No results for input(s): CKMB, TROPONINI,  MYOGLOBIN in the last 168 hours.  Invalid input(s): CK ------------------------------------------------------------------------------------------------------------------ No results found for: BNP  Inpatient Medications  Scheduled Meds: . enoxaparin (LOVENOX) injection  40 mg Subcutaneous Q24H  . insulin aspart  0-15 Units Subcutaneous TID WC  . insulin aspart  0-5 Units Subcutaneous QHS  . insulin detemir  30 Units Subcutaneous Daily   Continuous Infusions: . sodium chloride 75 mL/hr at 10/09/18 0757  . piperacillin-tazobactam (ZOSYN)  IV 3.375 g (10/09/18 1320)  . vancomycin Stopped (10/09/18 1002)   PRN Meds:.morphine injection  Micro Results Recent Results (from the past 240 hour(s))  Culture, blood (routine x 2)     Status: None (Preliminary result)   Collection Time: 10/08/18  2:41 PM  Result Value Ref Range Status   Specimen Description   Final    BLOOD RIGHT ARM Performed at [facility], [address], [place], [place] [address]    Special Requests   Final    BOTTLES DRAWN AEROBIC AND ANAEROBIC Blood Culture adequate volume Performed at [facility], [address], [place], [place] [address]    Culture   Final    NO GROWTH < 24 HOURS Performed at [facility], [address] [place], [place] [address]    Report Status PENDING  Incomplete  Culture, blood (routine x 2)     Status: None (Preliminary result)   Collection Time: 10/08/18  2:46 PM  Result Value Ref Range Status   Specimen Description   Final    BLOOD LEFT HAND Performed at [facility], [address] [place], [place] [address]    Special Requests   Final    BOTTLES DRAWN AEROBIC ONLY Blood Culture adequate volume Performed at [facility], [address], [place], [place] [address]    Culture   Final    NO GROWTH < 24 HOURS Performed at [facility], [address] [place], [place] [address]    Report  Status PENDING  Incomplete    Radiology Reports Mr Foot Left W Wo Contrast  Result Date: 10/07/2018 CLINICAL DATA:  Diabetic foot ulcer. Pain and swelling on the lateral aspect of the foot. EXAM: MRI OF THE LEFT FOREFOOT WITHOUT AND WITH CONTRAST TECHNIQUE: Multiplanar, multisequence MR imaging of the left foot was performed both before and after administration of intravenous contrast. CONTRAST:  10 cc Gadavist COMPARISON:  Radiographs 09/29/2018 FINDINGS: There is an open wound on the lateral aspect of the foot which extends right down to the bone of the base of the fifth metatarsal. There is an adjacent curvilinear soft tissue abscess measuring a maximum of 3 cm. Severe diffuse surrounding cellulitis. A second small rim enhancing fluid collection is closely associated with the lateral extensor tendons and is suspicious for septic tenosynovitis. Abnormal signal intensity and enhancement in the entire fifth metatarsal consistent with osteomyelitis. There is also a focus of osteomyelitis involving the cuboid laterally. I do not see any other obvious foci of osteomyelitis. Diffuse and severe cellulitis and myofasciitis without definite findings for pyomyositis. IMPRESSION: 1. Open wound on the lateral aspect of the foot extends right down to the bone of the base of the fifth metatarsal. Diffuse osteomyelitis involving the  fifth metatarsal and also the adjacent cuboid. 2. 3 cm soft tissue abscess near the open wound and findings suspicious for septic tenosynovitis involving the extensor tendons. 3. Severe cellulitis and myofasciitis without definite findings for pyomyositis. Electronically Signed   By: Rudie MeyerP.  Gallerani M.D.   On: 10/07/2018 02:06   Dg Foot 2 Views Left  Result Date: 09/30/2018 CLINICAL DATA:  Nonhealing diabetic ulcer on lateral foot. EXAM: LEFT FOOT - 2 VIEW COMPARISON:  July 05, 2018 FINDINGS: The ulcer is identified laterally near the base of the fifth metatarsal. There is high attenuation  in the soft tissues just anterior to the ulcer. No bony erosion. Osteopenia. Degenerative changes. No fracture. IMPRESSION: 1. The ulcer is seen laterally near the base of the fifth metatarsal. High attenuation seen in the soft tissues along the anterior aspect of the ulcer, not seen in August 2019, could represent foreign body or soft tissue calcification. Recommend clinical correlation. No bony erosion to suggest osteomyelitis. MRI would be more sensitive in the evaluation for osteomyelitis however. Electronically Signed   By: Gerome Samavid  Williams III M.D   On: 09/30/2018 17:11    Mliss Fritzawood Junior Huezo M.D on 10/09/2018 at 2:39 PM  Between 7am to 7pm - Pager - [phone]  After 7pm go to [email or web address] - password [email or web address]  [facility] -  Office  [phone]

## 2018-10-10 ENCOUNTER — Encounter (HOSPITAL_COMMUNITY): Payer: Self-pay | Admitting: Orthopedic Surgery

## 2018-10-10 ENCOUNTER — Inpatient Hospital Stay (HOSPITAL_COMMUNITY): Payer: Self-pay | Admitting: Certified Registered"

## 2018-10-10 ENCOUNTER — Encounter (HOSPITAL_COMMUNITY)
Admission: EM | Disposition: A | Discharge status: Home / Self Care | Payer: Self-pay | Source: Home / Self Care | Attending: Internal Medicine

## 2018-10-10 HISTORY — PX: AMPUTATION: SHX166

## 2018-10-10 LAB — BASIC METABOLIC PANEL
ANION GAP: 5 (ref 5–15)
BUN: 5 mg/dL — ABNORMAL LOW (ref 6–20)
CALCIUM: 8.6 mg/dL — AB (ref 8.9–10.3)
CHLORIDE: 107 mmol/L (ref 98–111)
CO2: 21 mmol/L — AB (ref 22–32)
Creatinine, Ser: 0.81 mg/dL (ref 0.61–1.24)
GFR calc non Af Amer: >60 mL/min (ref >60)
GLUCOSE: 179 mg/dL — AB (ref 70–99)
POTASSIUM: 4 mmol/L (ref 3.5–5.1)
Sodium: 133 mmol/L — ABNORMAL LOW (ref 135–145)

## 2018-10-10 LAB — CBC
HEMATOCRIT: 33.8 % — AB (ref 39.0–52.0)
HEMOGLOBIN: 10.9 g/dL — AB (ref 13.0–17.0)
MCH: 31.8 pg (ref 26.0–34.0)
MCHC: 32.2 g/dL (ref 30.0–36.0)
MCV: 98.5 fL (ref 80.0–100.0)
NRBC: 0 % (ref 0.0–0.2)
Platelets: 317 10*3/uL (ref 150–400)
RBC: 3.43 MIL/uL — AB (ref 4.22–5.81)
RDW: 13.2 % (ref 11.5–15.5)
WBC: 4.8 10*3/uL (ref 4.0–10.5)

## 2018-10-10 LAB — GLUCOSE, CAPILLARY
GLUCOSE-CAPILLARY: 132 mg/dL — AB (ref 70–99)
GLUCOSE-CAPILLARY: 118 mg/dL — AB (ref 70–99)
GLUCOSE-CAPILLARY: 97 mg/dL (ref 70–99)
Glucose-Capillary: 107 mg/dL — ABNORMAL HIGH (ref 70–99)
Glucose-Capillary: 142 mg/dL — ABNORMAL HIGH (ref 70–99)

## 2018-10-10 LAB — SURGICAL PCR SCREEN
MRSA, PCR: NEGATIVE
STAPHYLOCOCCUS AUREUS: NEGATIVE

## 2018-10-10 SURGERY — AMPUTATION BELOW KNEE
Anesthesia: General | Laterality: Left

## 2018-10-10 MED ORDER — FENTANYL CITRATE (PF) 250 MCG/5ML IJ SOLN
INTRAMUSCULAR | Status: AC
  Filled 2018-10-10: qty 5

## 2018-10-10 MED ORDER — DIPHENHYDRAMINE HCL 25 MG PO CAPS
25.0000 mg | ORAL_CAPSULE | Freq: Four times a day (QID) | ORAL | Status: DC | PRN
  Administered 2018-10-10: 25 mg via ORAL
  Filled 2018-10-10: qty 1

## 2018-10-10 MED ORDER — LIDOCAINE 2% (20 MG/ML) 5 ML SYRINGE
INTRAMUSCULAR | Status: DC | PRN
  Administered 2018-10-10: 70 mg via INTRAVENOUS

## 2018-10-10 MED ORDER — HYDROMORPHONE HCL 1 MG/ML IJ SOLN
INTRAMUSCULAR | Status: AC
  Filled 2018-10-10: qty 1

## 2018-10-10 MED ORDER — ONDANSETRON HCL 4 MG/2ML IJ SOLN
INTRAMUSCULAR | Status: DC | PRN
  Administered 2018-10-10: 4 mg via INTRAVENOUS

## 2018-10-10 MED ORDER — METOCLOPRAMIDE HCL 10 MG PO TABS: 5.0000 mg | ORAL_TABLET | Freq: Three times a day (TID) | ORAL | Status: DC | PRN

## 2018-10-10 MED ORDER — LACTATED RINGERS IV SOLN: INTRAVENOUS | Status: DC

## 2018-10-10 MED ORDER — CEFAZOLIN SODIUM-DEXTROSE 2-4 GM/100ML-% IV SOLN
2.0000 g | INTRAVENOUS | Status: AC
  Administered 2018-10-10: 2 g via INTRAVENOUS
  Filled 2018-10-10: qty 100

## 2018-10-10 MED ORDER — OXYCODONE HCL 5 MG PO TABS
5.0000 mg | ORAL_TABLET | ORAL | Status: DC | PRN
  Administered 2018-10-10 – 2018-10-11 (×3): 10 mg via ORAL
  Filled 2018-10-10: qty 1
  Filled 2018-10-10 (×2): qty 2

## 2018-10-10 MED ORDER — HYDROMORPHONE HCL 1 MG/ML IJ SOLN
0.2500 mg | INTRAMUSCULAR | Status: DC | PRN
  Administered 2018-10-10 (×4): 0.5 mg via INTRAVENOUS

## 2018-10-10 MED ORDER — MIDAZOLAM HCL 2 MG/2ML IJ SOLN
INTRAMUSCULAR | Status: DC | PRN
  Administered 2018-10-10: 2 mg via INTRAVENOUS

## 2018-10-10 MED ORDER — BISACODYL 10 MG RE SUPP: 10.0000 mg | Freq: Every day | RECTAL | Status: DC | PRN

## 2018-10-10 MED ORDER — ONDANSETRON HCL 4 MG PO TABS: 4.0000 mg | ORAL_TABLET | Freq: Four times a day (QID) | ORAL | Status: DC | PRN

## 2018-10-10 MED ORDER — ONDANSETRON HCL 4 MG/2ML IJ SOLN: 4.0000 mg | Freq: Four times a day (QID) | INTRAMUSCULAR | Status: DC | PRN

## 2018-10-10 MED ORDER — FENTANYL CITRATE (PF) 250 MCG/5ML IJ SOLN
INTRAMUSCULAR | Status: DC | PRN
  Administered 2018-10-10: 100 ug via INTRAVENOUS
  Administered 2018-10-10: 50 ug via INTRAVENOUS

## 2018-10-10 MED ORDER — MIDAZOLAM HCL 2 MG/2ML IJ SOLN
INTRAMUSCULAR | Status: AC
  Filled 2018-10-10: qty 2

## 2018-10-10 MED ORDER — SODIUM CHLORIDE 0.9 % IV SOLN
INTRAVENOUS | Status: DC
  Administered 2018-10-11: 05:00:00 via INTRAVENOUS

## 2018-10-10 MED ORDER — OXYCODONE HCL 5 MG PO TABS: 5.0000 mg | ORAL_TABLET | Freq: Once | ORAL | Status: DC | PRN

## 2018-10-10 MED ORDER — METHOCARBAMOL 500 MG PO TABS
ORAL_TABLET | ORAL | Status: AC
  Filled 2018-10-10: qty 1

## 2018-10-10 MED ORDER — DEXAMETHASONE SODIUM PHOSPHATE 10 MG/ML IJ SOLN
INTRAMUSCULAR | Status: AC
  Filled 2018-10-10: qty 2

## 2018-10-10 MED ORDER — PROPOFOL 10 MG/ML IV BOLUS
INTRAVENOUS | Status: AC
  Filled 2018-10-10: qty 20

## 2018-10-10 MED ORDER — HYDROMORPHONE HCL 1 MG/ML IJ SOLN: 0.5000 mg | INTRAMUSCULAR | Status: DC | PRN

## 2018-10-10 MED ORDER — OXYCODONE HCL 5 MG PO TABS
10.0000 mg | ORAL_TABLET | ORAL | Status: DC | PRN
  Administered 2018-10-10 – 2018-10-11 (×4): 15 mg via ORAL
  Filled 2018-10-10 (×2): qty 2
  Filled 2018-10-10 (×2): qty 3

## 2018-10-10 MED ORDER — METOCLOPRAMIDE HCL 5 MG/ML IJ SOLN: 5.0000 mg | Freq: Three times a day (TID) | INTRAMUSCULAR | Status: DC | PRN

## 2018-10-10 MED ORDER — FENTANYL CITRATE (PF) 100 MCG/2ML IJ SOLN
INTRAMUSCULAR | Status: AC
  Filled 2018-10-10: qty 2

## 2018-10-10 MED ORDER — METHOCARBAMOL 1000 MG/10ML IJ SOLN
500.0000 mg | Freq: Four times a day (QID) | INTRAVENOUS | Status: DC | PRN
  Filled 2018-10-10: qty 5

## 2018-10-10 MED ORDER — 0.9 % SODIUM CHLORIDE (POUR BTL) OPTIME
TOPICAL | Status: DC | PRN
  Administered 2018-10-10: 1000 mL

## 2018-10-10 MED ORDER — PROMETHAZINE HCL 25 MG/ML IJ SOLN: 6.2500 mg | INTRAMUSCULAR | Status: DC | PRN

## 2018-10-10 MED ORDER — PROPOFOL 10 MG/ML IV BOLUS
INTRAVENOUS | Status: DC | PRN
  Administered 2018-10-10: 150 mg via INTRAVENOUS

## 2018-10-10 MED ORDER — MAGNESIUM CITRATE PO SOLN: 1.0000 | Freq: Once | ORAL | Status: DC | PRN

## 2018-10-10 MED ORDER — ACETAMINOPHEN 325 MG PO TABS: 325.0000 mg | ORAL_TABLET | Freq: Four times a day (QID) | ORAL | Status: DC | PRN

## 2018-10-10 MED ORDER — METHOCARBAMOL 500 MG PO TABS
500.0000 mg | ORAL_TABLET | Freq: Four times a day (QID) | ORAL | Status: DC | PRN
  Administered 2018-10-10 – 2018-10-11 (×2): 500 mg via ORAL
  Filled 2018-10-10: qty 1

## 2018-10-10 MED ORDER — ONDANSETRON HCL 4 MG/2ML IJ SOLN
INTRAMUSCULAR | Status: AC
  Filled 2018-10-10: qty 2

## 2018-10-10 MED ORDER — DOCUSATE SODIUM 100 MG PO CAPS
100.0000 mg | ORAL_CAPSULE | Freq: Two times a day (BID) | ORAL | Status: DC
  Administered 2018-10-10 – 2018-10-12 (×4): 100 mg via ORAL
  Filled 2018-10-10 (×5): qty 1

## 2018-10-10 MED ORDER — OXYCODONE HCL 5 MG PO TABS
ORAL_TABLET | ORAL | Status: AC
  Filled 2018-10-10: qty 3

## 2018-10-10 MED ORDER — LIDOCAINE 2% (20 MG/ML) 5 ML SYRINGE
INTRAMUSCULAR | Status: AC
  Filled 2018-10-10: qty 5

## 2018-10-10 MED ORDER — OXYCODONE HCL 5 MG/5ML PO SOLN: 5.0000 mg | Freq: Once | ORAL | Status: DC | PRN

## 2018-10-10 MED ORDER — POLYETHYLENE GLYCOL 3350 17 G PO PACK: 17.0000 g | PACK | Freq: Every day | ORAL | Status: DC | PRN

## 2018-10-10 SURGICAL SUPPLY — 35 items
BENZOIN TINCTURE PRP APPL 2/3 (GAUZE/BANDAGES/DRESSINGS) ×12 IMPLANT
BLADE SAW RECIP 87.9 MT (BLADE) ×3 IMPLANT
BLADE SURG 21 STRL SS (BLADE) ×3 IMPLANT
CANISTER WOUNDNEG PRESSURE 500 (CANNISTER) ×2 IMPLANT
COVER SURGICAL LIGHT HANDLE (MISCELLANEOUS) ×3 IMPLANT
CUFF TOURNIQUET SINGLE 34IN LL (TOURNIQUET CUFF) IMPLANT
CUFF TOURNIQUET SINGLE 44IN (TOURNIQUET CUFF) IMPLANT
DRAPE INCISE IOBAN 66X45 STRL (DRAPES) ×2 IMPLANT
DRAPE U-SHAPE 47X51 STRL (DRAPES) ×3 IMPLANT
DRESSING PREVENA PLUS CUSTOM (GAUZE/BANDAGES/DRESSINGS) ×1 IMPLANT
DRSG PREVENA PLUS CUSTOM (GAUZE/BANDAGES/DRESSINGS) ×3
DURAPREP 26ML APPLICATOR (WOUND CARE) ×3 IMPLANT
ELECT REM PT RETURN 9FT ADLT (ELECTROSURGICAL) ×3
ELECTRODE REM PT RTRN 9FT ADLT (ELECTROSURGICAL) ×1 IMPLANT
GLOVE BIOGEL PI IND STRL 9 (GLOVE) ×1 IMPLANT
GLOVE BIOGEL PI INDICATOR 9 (GLOVE) ×2
GLOVE SURG ORTHO 9.0 STRL STRW (GLOVE) ×3 IMPLANT
GOWN STRL REUS W/ TWL XL LVL3 (GOWN DISPOSABLE) ×2 IMPLANT
GOWN STRL REUS W/TWL XL LVL3 (GOWN DISPOSABLE) ×4
KIT BASIN OR (CUSTOM PROCEDURE TRAY) ×3 IMPLANT
KIT DRSG PREVENA PLUS 7DAY 125 (MISCELLANEOUS) ×2 IMPLANT
KIT TURNOVER KIT B (KITS) ×3 IMPLANT
MANIFOLD NEPTUNE II (INSTRUMENTS) ×3 IMPLANT
NS IRRIG 1000ML POUR BTL (IV SOLUTION) ×3 IMPLANT
PACK ORTHO EXTREMITY (CUSTOM PROCEDURE TRAY) ×3 IMPLANT
PAD ARMBOARD 7.5X6 YLW CONV (MISCELLANEOUS) ×3 IMPLANT
PREVENA RESTOR ARTHOFORM 33X30 (CANNISTER) ×2 IMPLANT
STAPLER VISISTAT 35W (STAPLE) ×2 IMPLANT
STOCKINETTE IMPERVIOUS LG (DRAPES) ×3 IMPLANT
SUT SILK 2 0 (SUTURE) ×2
SUT SILK 2-0 18XBRD TIE 12 (SUTURE) ×1 IMPLANT
SUT VIC AB 1 CTX 27 (SUTURE) IMPLANT
TUBE CONNECTING 12'X1/4 (SUCTIONS) ×1
TUBE CONNECTING 12X1/4 (SUCTIONS) ×2 IMPLANT
YANKAUER SUCT BULB TIP NO VENT (SUCTIONS) ×3 IMPLANT

## 2018-10-10 NOTE — Anesthesia Postprocedure Evaluation (Signed)
Anesthesia Post Note  Patient: [name]  Procedure(s) Performed: LEFT BELOW KNEE AMPUTATION (Left )     Patient location during evaluation: PACU Anesthesia Type: General Level of consciousness: awake and alert Pain management: pain level controlled Vital Signs Assessment: post-procedure vital signs reviewed and stable Respiratory status: spontaneous breathing, nonlabored ventilation and respiratory function stable Cardiovascular status: blood pressure returned to baseline and stable Postop Assessment: no apparent nausea or vomiting Anesthetic complications: no    Last Vitals:  Vitals:   10/10/18 1336 10/10/18 1436  BP: (!) 161/91 127/86  Pulse: 64 70  Resp: 12 14  Temp:    SpO2: 100% 100%    Last Pain:  Vitals:   10/10/18 1436  TempSrc:   PainSc: 3                  Lowella CurbWarren Ray Shaylee Stanislawski

## 2018-10-10 NOTE — Progress Notes (Signed)
PROGRESS NOTE                                                                                                                                                                                                             Patient Demographics:    [name], is a 42 y.o. male, DOB - 1976-05-12, WUJ:[id number]  Admit date - 10/08/2018   Admitting Physician Burnadette PopAmrit Adhikari, MD  Outpatient Primary MD for the patient is System, Pcp Not In  LOS - 2   Chief Complaint  Patient presents with  . Foot Pain    DM infection       Brief Narrative   42 year old male with past medical controlled, presents with left foot infection, MRI significant for infection/osteomyelitis, seen by orthopedic, with recommendation for transtibial amputation, shunt went for surgery 10/10/2018 by Dr. Lajoyce Cornersuda    Subjective:    [name] today has, No headache, No chest pain, No abdominal pain -fever or chills.   Assessment  & Plan :    Principal Problem:   Diabetic ulcer of left foot (HCC) Active Problems:   Type 2 diabetes mellitus with foot ulcer (HCC)   Hyponatremia   Alcohol abuse   Osteomyelitis (HCC)   Foot ulcer (HCC)  Diabetic foot ulcer/osteomyelitis:  MRI reported as Open wound on the lateral aspect of the foot extends right down to the bone of the base of the fifth metatarsal. Diffuse osteomyelitis involving the fifth metatarsal and also the adjacent cuboid.3 cm soft tissue abscess near the open wound and findings suspicious for septic tenosynovitis involving the extensor tendons.Severe cellulitis and myofasciitis without definite findings for pyomyositis.  -Orthopedic consult greatly appreciated, patient does have extensive soft tissue infection, with osteomyelitis, and purulent drainage, and ulceration over the dorsum of the midfoot and plantar aspect of the midfoot, per Alice Riegerrth unfortunately he has no salvage options, recommendation is for transtibial amputation,  patient agreeable to proceed, and went for transtibial amputation with application of Praveena wound VAC 10/10/2018 by Dr. Lajoyce Cornersuda. -continue  with IV antibiotic vancomycin and Zosyn for next 24 hours after surgery, then can be discontinued.  Type 2 diabetes mellitus uncontrolled: - Is compliant with insulin regimen, his A1c once he is elevated at 8.9, but actually down 11.6 three month ago.  Continue with insulin sliding scale and Lantus(crease his dose by half  as he is n.p.o. for surgery today)  Hyponatremia:  - improving with IV fluids  Alcohol abuse: Past history of alcohol abuse.  Patient says he does not drink currently.     Code Status : Full  Family Communication  : none at bedsice  Disposition Plan  : pending PT evaluation after surgery  Consults  :  Orthopedic Dr Lajoyce Corners  Procedures  : None  DVT Prophylaxis  :  lovenox  Lab Results  Component Value Date   PLT 317 10/10/2018    Antibiotics  :    Anti-infectives (From admission, onward)   Start     Dose/Rate Route Frequency Ordered Stop   10/10/18 1000  ceFAZolin (ANCEF) IVPB 2g/100 mL premix     2 g 200 mL/hr over 30 Minutes Intravenous To Short Stay 10/10/18 0826 10/10/18 1136   10/08/18 2200  piperacillin-tazobactam (ZOSYN) IVPB 3.375 g     3.375 g 12.5 mL/hr over 240 Minutes Intravenous Every 8 hours 10/08/18 1316     10/08/18 2000  vancomycin (VANCOCIN) 1,500 mg in sodium chloride 0.9 % 500 mL IVPB     1,500 mg 250 mL/hr over 120 Minutes Intravenous Every 12 hours 10/08/18 1316     10/08/18 1300  piperacillin-tazobactam (ZOSYN) IVPB 3.375 g     3.375 g 100 mL/hr over 30 Minutes Intravenous  Once 10/08/18 1259 10/08/18 1540   10/08/18 1045  vancomycin (VANCOCIN) IVPB 1000 mg/200 mL premix     1,000 mg 200 mL/hr over 60 Minutes Intravenous  Once 10/08/18 1031 10/08/18 1231        Objective:   Vitals:   10/10/18 1250 10/10/18 1305 10/10/18 1318 10/10/18 1336  BP: (!) 152/85 (!) 159/90 (!) 154/88 (!)  161/91  Pulse: 65 64 61 64  Resp: 14 12 14 12   Temp:   98.1 F (36.7 C)   TempSrc:      SpO2: 100% 100% 100% 100%  Weight:      Height:        Wt Readings from Last 3 Encounters:  10/10/18 84 kg  07/05/18 97.5 kg  04/18/12 70.2 kg     Intake/Output Summary (Last 24 hours) at 10/10/2018 1425 Last data filed at 10/10/2018 1312 Gross per 24 hour  Intake 2533.3 ml  Output 950 ml  Net 1583.3 ml     Physical Exam  Was seen and examined earlier today before surgery  Awake Alert, Oriented X 3, No new F.N deficits, Normal affect Symmetrical Chest wall movement, Good air movement bilaterally, CTAB RRR,No Gallops,Rubs or new Murmurs, No Parasternal Heave +ve B.Sounds, Abd Soft, No tenderness, No rebound - guarding or rigidity. Left lower extremity with swelling, foot with lateral wound with foul-smelling odor   Data Review:    CBC Recent Labs  Lab 10/08/18 1135 10/09/18 0337 10/10/18 0331  WBC 8.2 5.7 4.8  HGB 10.3* 10.3* 10.9*  HCT 30.7* 30.3* 33.8*  PLT 290 277 317  MCV 101.0* 98.1 98.5  MCH 33.9 33.3 31.8  MCHC 33.6 34.0 32.2  RDW 13.1 13.2 13.2  LYMPHSABS 0.7  --   --   MONOABS 0.8  --   --   EOSABS 0.0  --   --   BASOSABS 0.0  --   --     Chemistries  Recent Labs  Lab 10/06/18 1329 10/08/18 1135 10/09/18 0337 10/10/18 0331  NA  --  130* 136 133*  K  --  3.7 3.5 4.0  CL  --  98  105 107  CO2  --  24 24 21*  GLUCOSE  --  238* 70 179*  BUN  --  10 5* 5*  CREATININE 0.70 0.81 0.78 0.81  CALCIUM  --  8.4* 8.5* 8.6*   ------------------------------------------------------------------------------------------------------------------ No results for input(s): CHOL, HDL, LDLCALC, TRIG, CHOLHDL, LDLDIRECT in the last 72 hours.  Lab Results  Component Value Date   HGBA1C 8.9 (H) 10/08/2018   ------------------------------------------------------------------------------------------------------------------ No results for input(s): TSH, T4TOTAL, T3FREE,  THYROIDAB in the last 72 hours.  Invalid input(s): FREET3 ------------------------------------------------------------------------------------------------------------------ No results for input(s): VITAMINB12, FOLATE, FERRITIN, TIBC, IRON, RETICCTPCT in the last 72 hours.  Coagulation profile No results for input(s): INR, PROTIME in the last 168 hours.  No results for input(s): DDIMER in the last 72 hours.  Cardiac Enzymes No results for input(s): CKMB, TROPONINI, MYOGLOBIN in the last 168 hours.  Invalid input(s): CK ------------------------------------------------------------------------------------------------------------------ No results found for: BNP  Inpatient Medications  Scheduled Meds: . docusate sodium  100 mg Oral BID  . enoxaparin (LOVENOX) injection  40 mg Subcutaneous Q24H  . HYDROmorphone      . HYDROmorphone      . insulin aspart  0-15 Units Subcutaneous TID WC  . insulin aspart  0-5 Units Subcutaneous QHS  . insulin detemir  30 Units Subcutaneous Daily  . methocarbamol      . oxyCODONE       Continuous Infusions: . sodium chloride 75 mL/hr at 10/10/18 1350  . sodium chloride    . methocarbamol (ROBAXIN) IV    . piperacillin-tazobactam (ZOSYN)  IV 3.375 g (10/10/18 0558)  . vancomycin 1,500 mg (10/10/18 0852)   PRN Meds:.[START ON 10/11/2018] acetaminophen, bisacodyl, diphenhydrAMINE, HYDROmorphone (DILAUDID) injection, magnesium citrate, methocarbamol **OR** methocarbamol (ROBAXIN) IV, metoCLOPramide **OR** metoCLOPramide (REGLAN) injection, morphine injection, ondansetron **OR** ondansetron (ZOFRAN) IV, oxyCODONE, oxyCODONE, polyethylene glycol  Micro Results Recent Results (from the past 240 hour(s))  Culture, blood (routine x 2)     Status: None (Preliminary result)   Collection Time: 10/08/18  2:41 PM  Result Value Ref Range Status   Specimen Description   Final    BLOOD RIGHT ARM Performed at [facility], [address]  [place], [place] [address]    Special Requests   Final    BOTTLES DRAWN AEROBIC AND ANAEROBIC Blood Culture adequate volume Performed at [facility], [address] [place], [place] [address]    Culture   Final    NO GROWTH 2 DAYS Performed at [facility], [address], [place], [place] [address]    Report Status PENDING  Incomplete  Culture, blood (routine x 2)     Status: None (Preliminary result)   Collection Time: 10/08/18  2:46 PM  Result Value Ref Range Status   Specimen Description   Final    BLOOD LEFT HAND Performed at [facility], [address] [place], [place] [address]    Special Requests   Final    BOTTLES DRAWN AEROBIC ONLY Blood Culture adequate volume Performed at [facility], [address] [place], [place] [address]    Culture   Final    NO GROWTH 2 DAYS Performed at [facility], [address], [place], [place] [address]    Report Status PENDING  Incomplete  Surgical pcr screen     Status: None   Collection Time: 10/09/18  8:48 PM  Result Value Ref Range Status   MRSA, PCR NEGATIVE NEGATIVE Final  Staphylococcus aureus NEGATIVE NEGATIVE Final    Comment: (NOTE) The Xpert SA Assay (FDA approved for NASAL specimens in patients 72 years of age and older), is one component of a comprehensive surveillance program. It is not intended to diagnose infection nor to guide or monitor treatment. Performed at [facility], [address] [place], [place] [address]     Radiology Reports Mr Foot Left W Wo Contrast  Result Date: 10/07/2018 CLINICAL DATA:  Diabetic foot ulcer. Pain and swelling on the lateral aspect of the foot. EXAM: MRI OF THE LEFT FOREFOOT WITHOUT AND WITH CONTRAST TECHNIQUE: Multiplanar, multisequence MR imaging of the left foot was performed both before and after administration of intravenous contrast. CONTRAST:  10 cc Gadavist COMPARISON:  Radiographs 09/29/2018  FINDINGS: There is an open wound on the lateral aspect of the foot which extends right down to the bone of the base of the fifth metatarsal. There is an adjacent curvilinear soft tissue abscess measuring a maximum of 3 cm. Severe diffuse surrounding cellulitis. A second small rim enhancing fluid collection is closely associated with the lateral extensor tendons and is suspicious for septic tenosynovitis. Abnormal signal intensity and enhancement in the entire fifth metatarsal consistent with osteomyelitis. There is also a focus of osteomyelitis involving the cuboid laterally. I do not see any other obvious foci of osteomyelitis. Diffuse and severe cellulitis and myofasciitis without definite findings for pyomyositis. IMPRESSION: 1. Open wound on the lateral aspect of the foot extends right down to the bone of the base of the fifth metatarsal. Diffuse osteomyelitis involving the fifth metatarsal and also the adjacent cuboid. 2. 3 cm soft tissue abscess near the open wound and findings suspicious for septic tenosynovitis involving the extensor tendons. 3. Severe cellulitis and myofasciitis without definite findings for pyomyositis. Electronically Signed   By: Rudie Meyer M.D.   On: 10/07/2018 02:06   Dg Foot 2 Views Left  Result Date: 09/30/2018 CLINICAL DATA:  Nonhealing diabetic ulcer on lateral foot. EXAM: LEFT FOOT - 2 VIEW COMPARISON:  July 05, 2018 FINDINGS: The ulcer is identified laterally near the base of the fifth metatarsal. There is high attenuation in the soft tissues just anterior to the ulcer. No bony erosion. Osteopenia. Degenerative changes. No fracture. IMPRESSION: 1. The ulcer is seen laterally near the base of the fifth metatarsal. High attenuation seen in the soft tissues along the anterior aspect of the ulcer, not seen in August 2019, could represent foreign body or soft tissue calcification. Recommend clinical correlation. No bony erosion to suggest osteomyelitis. MRI would be more  sensitive in the evaluation for osteomyelitis however. Electronically Signed   By: Gerome Sam III M.D   On: 09/30/2018 17:11    Mliss Fritz Sarina Robleto M.D on 10/10/2018 at 2:25 PM  Between 7am to 7pm - Pager - [phone]  After 7pm go to [email or web address] - password [facility]  [facility] -  Office  [phone]

## 2018-10-10 NOTE — Interval H&P Note (Signed)
History and Physical Interval Note:  10/10/2018 6:44 AM  [name]  has presented today for surgery, with the diagnosis of Abscess, Osteomyelitis Left Foot  The various methods of treatment have been discussed with the patient and family. After consideration of risks, benefits and other options for treatment, the patient has consented to  Procedure(s): LEFT BELOW KNEE AMPUTATION (Left) as a surgical intervention .  The patient's history has been reviewed, patient examined, no change in status, stable for surgery.  I have reviewed the patient's chart and labs.  Questions were answered to the patient's satisfaction.     Nadara MustardMarcus V Kallan Merrick

## 2018-10-10 NOTE — Anesthesia Preprocedure Evaluation (Addendum)
Anesthesia Evaluation  Patient identified by MRN, date of birth, ID band Patient awake    Reviewed: Allergy & Precautions, NPO status , Patient's Chart, lab work & pertinent test results  Airway Mallampati: II  TM Distance: >3 FB Neck ROM: Full    Dental no notable dental hx. (+) Poor Dentition, Chipped, Missing, Dental Advisory Given   Pulmonary neg pulmonary ROS, former smoker,    Pulmonary exam normal breath sounds clear to auscultation       Cardiovascular negative cardio ROS Normal cardiovascular exam Rhythm:Regular Rate:Normal     Neuro/Psych PSYCHIATRIC DISORDERS negative neurological ROS     GI/Hepatic negative GI ROS, Neg liver ROS,   Endo/Other  negative endocrine ROSdiabetes, Type 2, Insulin Dependent  Renal/GU negative Renal ROS  negative genitourinary   Musculoskeletal negative musculoskeletal ROS (+)   Abdominal   Peds negative pediatric ROS (+)  Hematology negative hematology ROS (+)   Anesthesia Other Findings   Reproductive/Obstetrics negative OB ROS                            Anesthesia Physical Anesthesia Plan  ASA: III  Anesthesia Plan: General   Post-op Pain Management:    Induction: Intravenous  PONV Risk Score and Plan: 2 and Ondansetron and Midazolam  Airway Management Planned: LMA  Additional Equipment:   Intra-op Plan:   Post-operative Plan: Extubation in OR  Informed Consent: I have reviewed the patients History and Physical, chart, labs and discussed the procedure including the risks, benefits and alternatives for the proposed anesthesia with the patient or authorized representative who has indicated his/her understanding and acceptance.   Dental advisory given  Plan Discussed with: CRNA  Anesthesia Plan Comments:         Anesthesia Quick Evaluation

## 2018-10-10 NOTE — Anesthesia Procedure Notes (Signed)
Procedure Name: LMA Insertion Date/Time: 10/10/2018 11:48 AM Performed by: De Nurseennie, Mcadoo Muzquiz E, CRNA Pre-anesthesia Checklist: Patient identified, Emergency Drugs available, Suction available and Patient being monitored Patient Re-evaluated:Patient Re-evaluated prior to induction Oxygen Delivery Method: Circle System Utilized Preoxygenation: Pre-oxygenation with 100% oxygen Induction Type: IV induction Ventilation: Mask ventilation without difficulty LMA: LMA inserted LMA Size: 5.0 Number of attempts: 1 Placement Confirmation: positive ETCO2 Tube secured with: Tape Dental Injury: Teeth and Oropharynx as per pre-operative assessment

## 2018-10-10 NOTE — Transfer of Care (Signed)
Immediate Anesthesia Transfer of Care Note  Patient: [name]  Procedure(s) Performed: LEFT BELOW KNEE AMPUTATION (Left )  Patient Location: PACU  Anesthesia Type:General  Level of Consciousness: awake, alert  and oriented  Airway & Oxygen Therapy: Patient connected to face mask oxygen  Post-op Assessment: Post -op Vital signs reviewed and stable  Post vital signs: stable  Last Vitals:  Vitals Value Taken Time  BP 132/85 10/10/2018 12:35 PM  Temp    Pulse 66 10/10/2018 12:35 PM  Resp 19 10/10/2018 12:35 PM  SpO2 100 % 10/10/2018 12:35 PM  Vitals shown include unvalidated device data.  Last Pain:  Vitals:   10/10/18 0845  TempSrc:   PainSc: 0-No pain         Complications: No apparent anesthesia complications

## 2018-10-10 NOTE — Op Note (Signed)
   Date of Surgery: 10/10/2018  INDICATIONS: Mr. Nedra HaiLee is a 42 y.o.-year-old male who has had chronic abscess, osteomyelitis left midfoot.  PREOPERATIVE DIAGNOSIS: abscess, osteomyelitis left midfoot  POSTOPERATIVE DIAGNOSIS: Same.  PROCEDURE: Transtibial amputation Application of Prevena wound VAC  SURGEON: Lajoyce Cornersuda, M.D.  ANESTHESIA:  general  IV FLUIDS AND URINE: See anesthesia.  ESTIMATED BLOOD LOSS: 100 mL.  COMPLICATIONS: None.  DESCRIPTION OF PROCEDURE: The patient was brought to the operating room and underwent a general anesthetic. After adequate levels of anesthesia were obtained patient's lower extremity was prepped using DuraPrep draped into a sterile field. A timeout was called. The foot was draped out of the sterile field with impervious stockinette. A transverse incision was made 11 cm distal to the tibial tubercle. This curved proximally and a large posterior flap was created. The tibia was transected 1 cm proximal to the skin incision. The fibula was transected just proximal to the tibial incision. The tibia was beveled anteriorly. A large posterior flap was created. The sciatic nerve was pulled cut and allowed to retract. The vascular bundles were suture ligated with 2-0 silk. The deep and superficial fascial layers were closed using #1 Vicryl. The skin was closed using staples and 2-0 nylon. The wound was covered with a Prevena restor and customizeable wound VAC. There was a good suction fit. A prosthetic shrinker will be  applied. Patient was extubated taken to the PACU in stable condition.   DISCHARGE PLANNING:  Antibiotic duration:24 hours post op  Weightbearing: NWB left  Pain medication: opoid pathway  Dressing care/ Wound WUJ:[id number]:continue vac for 1 week  Discharge to: home when safe with PT  Follow-up: In the office 1 week post operative.  Aldean BakerMarcus Anikah Hogge, MD [facility] 12:33 PM

## 2018-10-11 ENCOUNTER — Encounter (HOSPITAL_COMMUNITY): Payer: Self-pay | Admitting: Orthopedic Surgery

## 2018-10-11 DIAGNOSIS — E11621 Type 2 diabetes mellitus with foot ulcer: Secondary | ICD-10-CM

## 2018-10-11 DIAGNOSIS — E871 Hypo-osmolality and hyponatremia: Secondary | ICD-10-CM

## 2018-10-11 DIAGNOSIS — L97509 Non-pressure chronic ulcer of other part of unspecified foot with unspecified severity: Secondary | ICD-10-CM

## 2018-10-11 LAB — CBC
HCT: 34.3 % — ABNORMAL LOW (ref 39.0–52.0)
Hemoglobin: 11 g/dL — ABNORMAL LOW (ref 13.0–17.0)
MCH: 32.2 pg (ref 26.0–34.0)
MCHC: 32.1 g/dL (ref 30.0–36.0)
MCV: 100.3 fL — ABNORMAL HIGH (ref 80.0–100.0)
PLATELETS: 303 10*3/uL (ref 150–400)
RBC: 3.42 MIL/uL — ABNORMAL LOW (ref 4.22–5.81)
RDW: 13.1 % (ref 11.5–15.5)
WBC: 3.6 10*3/uL — AB (ref 4.0–10.5)
nRBC: 0 % (ref 0.0–0.2)

## 2018-10-11 LAB — BASIC METABOLIC PANEL
Anion gap: 7 (ref 5–15)
BUN: 5 mg/dL — ABNORMAL LOW (ref 6–20)
CALCIUM: 8.4 mg/dL — AB (ref 8.9–10.3)
CO2: 23 mmol/L (ref 22–32)
CREATININE: 0.84 mg/dL (ref 0.61–1.24)
Chloride: 106 mmol/L (ref 98–111)
GFR calc non Af Amer: >60 mL/min (ref >60)
Glucose, Bld: 110 mg/dL — ABNORMAL HIGH (ref 70–99)
Potassium: 3.8 mmol/L (ref 3.5–5.1)
SODIUM: 136 mmol/L (ref 135–145)

## 2018-10-11 LAB — GLUCOSE, CAPILLARY
GLUCOSE-CAPILLARY: 151 mg/dL — AB (ref 70–99)
Glucose-Capillary: 95 mg/dL (ref 70–99)
Glucose-Capillary: 136 mg/dL — ABNORMAL HIGH (ref 70–99)
Glucose-Capillary: 85 mg/dL (ref 70–99)

## 2018-10-11 NOTE — Patient Instructions (Signed)
Access Code: [email or web address]  URL: [email or web address]  Date: 10/11/2018  Prepared by: Elray Mcgregorynthia Pj Zehner   Exercises  Supine Quad Set (BKA) - 10 reps - 1 sets - 5 hold - 2x daily - 7x weekly  Seated Knee Extension (BKA) - 10 reps - 1 sets - 2x daily - 7x weekly  Sidelying Isometric Hip Adduction (BKA) - 10 reps - 1 sets - 2x daily - 7x weekly  Sidelying Hip Abduction (BKA) - 10 reps - 1 sets - 2x daily - 7x weekly  Small Range Straight Leg Raise - 10 reps - 1 sets - 2x daily - 7x weekly

## 2018-10-11 NOTE — Progress Notes (Signed)
Patient ID: [name], male   DOB: Jan 16, 1976, 42 y.o.   MRN: [id number] Postoperative day 1 left transtibial amputation.  Patient states he has no pain.  He has full extension of his knee the importance of extension exercises were discussed.  There is no drainage in the wound VAC canister.  Patient may discharge to home when he is safe with ambulation.  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  Discharge with the portable Praveena wound VAC pump.

## 2018-10-11 NOTE — Progress Notes (Signed)
PROGRESS NOTE                                                                                                                                                                                                             Patient Demographics:    [name], is a 42 y.o. male, DOB - 22-Dec-1975, WUJ:[id number]  Admit date - 10/08/2018   Admitting Physician Burnadette PopAmrit Adhikari, MD  Outpatient Primary MD for the patient is System, Pcp Not In  LOS - 3   Chief Complaint  Patient presents with  . Foot Pain    DM infection       Brief Narrative   42 year old male with past medical controlled, presents with left foot infection, MRI significant for infection/osteomyelitis, seen by orthopedic, with recommendation for transtibial amputation,  went for surgery 10/10/2018 by Dr. Lajoyce Cornersuda, he was kept empirically on vancomycin and Zosyn on admission, was continued until 24 hours after surgery.    Subjective:    [name] today has, No headache, No chest pain, No abdominal pain -denies any fever or chills   Assessment  & Plan :    Principal Problem:   Diabetic ulcer of left foot (HCC) Active Problems:   Hyponatremia   Alcohol abuse   Osteomyelitis (HCC)   Foot ulcer (HCC)   Type 2 diabetes mellitus with foot ulcer (HCC)   Diabetic foot ulcer/osteomyelitis:  MRI reported as Open wound on the lateral aspect of the foot extends right down to the bone of the base of the fifth metatarsal. Diffuse osteomyelitis involving the fifth metatarsal and also the adjacent cuboid.3 cm soft tissue abscess near the open wound and findings suspicious for septic tenosynovitis involving the extensor tendons.Severe cellulitis and myofasciitis without definite findings for pyomyositis.  -Orthopedic consult greatly appreciated, patient does have extensive soft tissue infection, with osteomyelitis, and purulent drainage, and ulceration over the dorsum of the midfoot and plantar  aspect of the midfoot, per [name] unfortunately he has no salvage options, recommendation is for transtibial amputation, patient agreeable to proceed, and went for transtibial amputation with application of Praveena wound VAC 10/10/2018 by Dr. Lajoyce Cornersuda. -He was kept on IV vancomycin and Zosyn empirically on admission, it is 24 hours post surgery today, will discontinue -The input appreciated, hopefully ready for discharge in 1 to 2 days  Type 2 diabetes mellitus  uncontrolled: - He is compliant with insulin regimen, his A1c once he is elevated at 8.9, but actually down from 11.6 three month ago.  Is on Lantus 30 units subcu daily, with good control,  Hyponatremia:  - improving with IV fluids  Alcohol abuse: Past history of alcohol abuse.  Patient says he does not drink currently.     Code Status : Full  Family Communication  : none at bedsice  Disposition Plan  : Home with home PT  Consults  :  Orthopedic Dr Lajoyce Corners  Procedures  : None  DVT Prophylaxis  :  lovenox  Lab Results  Component Value Date   PLT 303 10/11/2018    Antibiotics  :    Anti-infectives (From admission, onward)   Start     Dose/Rate Route Frequency Ordered Stop   10/10/18 1000  ceFAZolin (ANCEF) IVPB 2g/100 mL premix     2 g 200 mL/hr over 30 Minutes Intravenous To Short Stay 10/10/18 0826 10/10/18 1136   10/08/18 2200  piperacillin-tazobactam (ZOSYN) IVPB 3.375 g  Status:  Discontinued     3.375 g 12.5 mL/hr over 240 Minutes Intravenous Every 8 hours 10/08/18 1316 10/11/18 1432   10/08/18 2000  vancomycin (VANCOCIN) 1,500 mg in sodium chloride 0.9 % 500 mL IVPB  Status:  Discontinued     1,500 mg 250 mL/hr over 120 Minutes Intravenous Every 12 hours 10/08/18 1316 10/11/18 1432   10/08/18 1300  piperacillin-tazobactam (ZOSYN) IVPB 3.375 g     3.375 g 100 mL/hr over 30 Minutes Intravenous  Once 10/08/18 1259 10/08/18 1540   10/08/18 1045  vancomycin (VANCOCIN) IVPB 1000 mg/200 mL premix     1,000 mg 200  mL/hr over 60 Minutes Intravenous  Once 10/08/18 1031 10/08/18 1231        Objective:   Vitals:   10/11/18 0112 10/11/18 0340 10/11/18 0737 10/11/18 1204  BP: (!) 142/88 (!) 143/94 (!) 159/90 (!) 145/98  Pulse: 60 62 (!) 52 71  Resp: 18 18 18 16   Temp: (!) 97.4 F (36.3 C) (!) 97.5 F (36.4 C) 97.6 F (36.4 C) 97.8 F (36.6 C)  TempSrc: Oral Oral Oral   SpO2: 100% 100% 100% 100%  Weight:      Height:        Wt Readings from Last 3 Encounters:  10/10/18 84 kg  07/05/18 97.5 kg  04/18/12 70.2 kg     Intake/Output Summary (Last 24 hours) at 10/11/2018 1437 Last data filed at 10/11/2018 0952 Gross per 24 hour  Intake 1780.66 ml  Output 1525 ml  Net 255.66 ml     Physical Exam   Awake Alert, Oriented X 3, No new F.N deficits, Normal affect Symmetrical Chest wall movement, Good air movement bilaterally, CTAB RRR,No Gallops,Rubs or new Murmurs, No Parasternal Heave +ve B.Sounds, Abd Soft, No tenderness, No rebound - guarding or rigidity. No Cyanosis, Clubbing or edema in the right lower extremity, status post left BKA left lower extremity, with wound VAC inside    Data Review:    CBC Recent Labs  Lab 10/08/18 1135 10/09/18 0337 10/10/18 0331 10/11/18 0531  WBC 8.2 5.7 4.8 3.6*  HGB 10.3* 10.3* 10.9* 11.0*  HCT 30.7* 30.3* 33.8* 34.3*  PLT 290 277 317 303  MCV 101.0* 98.1 98.5 100.3*  MCH 33.9 33.3 31.8 32.2  MCHC 33.6 34.0 32.2 32.1  RDW 13.1 13.2 13.2 13.1  LYMPHSABS 0.7  --   --   --   MONOABS 0.8  --   --   --  EOSABS 0.0  --   --   --   BASOSABS 0.0  --   --   --     Chemistries  Recent Labs  Lab 10/06/18 1329 10/08/18 1135 10/09/18 0337 10/10/18 0331 10/11/18 0531  NA  --  130* 136 133* 136  K  --  3.7 3.5 4.0 3.8  CL  --  98 105 107 106  CO2  --  24 24 21* 23  GLUCOSE  --  238* 70 179* 110*  BUN  --  10 5* 5* 5*  CREATININE 0.70 0.81 0.78 0.81 0.84  CALCIUM  --  8.4* 8.5* 8.6* 8.4*    ------------------------------------------------------------------------------------------------------------------ No results for input(s): CHOL, HDL, LDLCALC, TRIG, CHOLHDL, LDLDIRECT in the last 72 hours.  Lab Results  Component Value Date   HGBA1C 8.9 (H) 10/08/2018   ------------------------------------------------------------------------------------------------------------------ No results for input(s): TSH, T4TOTAL, T3FREE, THYROIDAB in the last 72 hours.  Invalid input(s): FREET3 ------------------------------------------------------------------------------------------------------------------ No results for input(s): VITAMINB12, FOLATE, FERRITIN, TIBC, IRON, RETICCTPCT in the last 72 hours.  Coagulation profile No results for input(s): INR, PROTIME in the last 168 hours.  No results for input(s): DDIMER in the last 72 hours.  Cardiac Enzymes No results for input(s): CKMB, TROPONINI, MYOGLOBIN in the last 168 hours.  Invalid input(s): CK ------------------------------------------------------------------------------------------------------------------ No results found for: BNP  Inpatient Medications  Scheduled Meds: . docusate sodium  100 mg Oral BID  . enoxaparin (LOVENOX) injection  40 mg Subcutaneous Q24H  . insulin aspart  0-15 Units Subcutaneous TID WC  . insulin aspart  0-5 Units Subcutaneous QHS  . insulin detemir  30 Units Subcutaneous Daily   Continuous Infusions: . sodium chloride 75 mL/hr at 10/11/18 0600  . sodium chloride Stopped (10/11/18 0523)  . methocarbamol (ROBAXIN) IV     PRN Meds:.acetaminophen, bisacodyl, diphenhydrAMINE, HYDROmorphone (DILAUDID) injection, magnesium citrate, methocarbamol **OR** methocarbamol (ROBAXIN) IV, metoCLOPramide **OR** metoCLOPramide (REGLAN) injection, morphine injection, ondansetron **OR** ondansetron (ZOFRAN) IV, oxyCODONE, oxyCODONE, polyethylene glycol  Micro Results Recent Results (from the past 240 hour(s))   Culture, blood (routine x 2)     Status: None (Preliminary result)   Collection Time: 10/08/18  2:41 PM  Result Value Ref Range Status   Specimen Description   Final    BLOOD RIGHT ARM Performed at [facility], [address], [place], [place] [address]    Special Requests   Final    BOTTLES DRAWN AEROBIC AND ANAEROBIC Blood Culture adequate volume Performed at [facility], [address] [place], [place] [address]    Culture   Final    NO GROWTH 3 DAYS Performed at [facility], [address], [place], [place] [address]    Report Status PENDING  Incomplete  Culture, blood (routine x 2)     Status: None (Preliminary result)   Collection Time: 10/08/18  2:46 PM  Result Value Ref Range Status   Specimen Description   Final    BLOOD LEFT HAND Performed at [facility], [address], [place], [place] [address]    Special Requests   Final    BOTTLES DRAWN AEROBIC ONLY Blood Culture adequate volume Performed at [facility], [address], [place], [place] [address]    Culture   Final    NO GROWTH 3 DAYS Performed at [facility], [address], [place], [place] [address]    Report Status PENDING  Incomplete  Surgical pcr screen  Status: None   Collection Time: 10/09/18  8:48 PM  Result Value Ref Range Status   MRSA, PCR NEGATIVE NEGATIVE Final   Staphylococcus aureus NEGATIVE NEGATIVE Final    Comment: (NOTE) The Xpert SA Assay (FDA approved for NASAL specimens in patients 66 years of age and older), is one component of a comprehensive surveillance program. It is not intended to diagnose infection nor to guide or monitor treatment. Performed at [facility], [address], [place], [place] [address]     Radiology Reports Mr Foot Left W Wo Contrast  Result Date: 10/07/2018 CLINICAL DATA:  Diabetic foot ulcer. Pain and swelling on the lateral aspect of the foot.  EXAM: MRI OF THE LEFT FOREFOOT WITHOUT AND WITH CONTRAST TECHNIQUE: Multiplanar, multisequence MR imaging of the left foot was performed both before and after administration of intravenous contrast. CONTRAST:  10 cc Gadavist COMPARISON:  Radiographs 09/29/2018 FINDINGS: There is an open wound on the lateral aspect of the foot which extends right down to the bone of the base of the fifth metatarsal. There is an adjacent curvilinear soft tissue abscess measuring a maximum of 3 cm. Severe diffuse surrounding cellulitis. A second small rim enhancing fluid collection is closely associated with the lateral extensor tendons and is suspicious for septic tenosynovitis. Abnormal signal intensity and enhancement in the entire fifth metatarsal consistent with osteomyelitis. There is also a focus of osteomyelitis involving the cuboid laterally. I do not see any other obvious foci of osteomyelitis. Diffuse and severe cellulitis and myofasciitis without definite findings for pyomyositis. IMPRESSION: 1. Open wound on the lateral aspect of the foot extends right down to the bone of the base of the fifth metatarsal. Diffuse osteomyelitis involving the fifth metatarsal and also the adjacent cuboid. 2. 3 cm soft tissue abscess near the open wound and findings suspicious for septic tenosynovitis involving the extensor tendons. 3. Severe cellulitis and myofasciitis without definite findings for pyomyositis. Electronically Signed   By: Rudie Meyer M.D.   On: 10/07/2018 02:06   Dg Foot 2 Views Left  Result Date: 09/30/2018 CLINICAL DATA:  Nonhealing diabetic ulcer on lateral foot. EXAM: LEFT FOOT - 2 VIEW COMPARISON:  July 05, 2018 FINDINGS: The ulcer is identified laterally near the base of the fifth metatarsal. There is high attenuation in the soft tissues just anterior to the ulcer. No bony erosion. Osteopenia. Degenerative changes. No fracture. IMPRESSION: 1. The ulcer is seen laterally near the base of the fifth metatarsal.  High attenuation seen in the soft tissues along the anterior aspect of the ulcer, not seen in August 2019, could represent foreign body or soft tissue calcification. Recommend clinical correlation. No bony erosion to suggest osteomyelitis. MRI would be more sensitive in the evaluation for osteomyelitis however. Electronically Signed   By: Gerome Sam III M.D   On: 09/30/2018 17:11    Mliss Fritz Rylee Huestis M.D on 10/11/2018 at 2:37 PM  Between 7am to 7pm - Pager - [phone]  After 7pm go to [email or web address] - password [facility]  [facility] -  Office  [phone]

## 2018-10-11 NOTE — Care Management Note (Addendum)
Case Management Note  Patient Details  Name: Glory Buffntonio Whipp MRN: [id number] Date of Birth: 12-31-1975  Subjective/Objective:  Admitted with persistent left foot ulcer with osteomyelitis. Hx of diabetes mellitus type 2. Resides with fiance', Rachael. PTA independent with ADL's, no DME usage.       - s/p L BKA, 11/13    PCP: Nechama Guardaniel K Ntibrey FNP Address: [address], [place], [place] [address] Phone: [phone]   Action/Plan: Transition to home with home health services pending charity approval for home health services ( PT,SW)  and DME needs  ( Rolling walker with 5" wheels , 3 in1, tub bench).  [facility] and [facility] needs. Pt without health insurance, no PCP,  Jobless.  NCM will continue to monitor for TOC needs.  Expected Discharge Date:  (unknown)               Expected Discharge Plan:  Home/Self Care  In-House Referral:     Discharge planning Services     Post Acute Care Choice:    Choice offered to:     DME Arranged:   rolling walker , BSC DME Agency:   [facility]  HH Arranged:    HH Agency:   [facility]  Status of Service:     If discussed at Long Length of Stay Meetings, dates discussed:    Additional Comments:  Epifanio LeschesCole, Aldean Pipe Hudson, RN 10/11/2018, 3:05 PM

## 2018-10-11 NOTE — Evaluation (Signed)
Physical Therapy Evaluation Patient Details Name: [name] Claude MRN: [id number] DOB: August 10, 1976 Today's Date: 10/11/2018   History of Present Illness  [name] Farler is a 42 y.o. male with medical history significant of diabetes mellitus type 2, on insulin admitted with persistent left foot ulcer with osteomyelitis now s/p L BKA.  Clinical Impression  Patient presents with decreased independence with mobility due to decreased balance, decreased activity tolerance and will benefit from skilled PT in the acute setting to allow d/c home with fiance support and follow up HHPT.  Currently min to minguard A for mobility. Needs to practice stairs and crutches prior to d/c.    Follow Up Recommendations Home health PT;Supervision/Assistance - 24 hour    Equipment Recommendations  Rolling walker with 5" wheels;Other (comment);3in1 (PT)(crutches, tub bench)    Recommendations for Other Services       Precautions / Restrictions Precautions Precautions: Fall Restrictions Weight Bearing Restrictions: Yes LLE Weight Bearing: Non weight bearing      Mobility  Bed Mobility Overal bed mobility: Modified Independent                Transfers Overall transfer level: Needs assistance Equipment used: Rolling walker (2 wheeled) Transfers: Sit to/from Stand Sit to Stand: Min guard         General transfer comment: cues for hand placement, assist for safety  Ambulation/Gait Ambulation/Gait assistance: Min assist;Min guard Gait Distance (Feet): 140 Feet Assistive device: Rolling walker (2 wheeled) Gait Pattern/deviations: Step-to pattern     General Gait Details: good positioning with walker and min cues for safety on turns, assist to steady  Stairs            Wheelchair Mobility    Modified Rankin (Stroke Patients Only)       Balance Overall balance assessment: Needs assistance   Sitting balance-Leahy Scale: Good     Standing balance support: Bilateral upper extremity  supported Standing balance-Leahy Scale: Poor Standing balance comment: due to L BKA                             Pertinent Vitals/Pain Pain Assessment: 0-10 Pain Score: 4  Pain Location: L LE Pain Descriptors / Indicators: Operative site guarding Pain Intervention(s): Monitored during session;Repositioned;Premedicated before session    Home Living Family/patient expects to be discharged to:: Private residence Living Arrangements: Spouse/significant other(fiance) Available Help at Discharge: Family;Available 24 hours/day Type of Home: Apartment Home Access: Stairs to enter Entrance Stairs-Rails: Right;Left;Can reach both Entrance Stairs-Number of Steps: 4 Home Layout: One level Home Equipment: None      Prior Function Level of Independence: Independent         Comments: 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, rode to work with friends     Higher education careers adviserHand Dominance        Extremity/Trunk Assessment   Upper Extremity Assessment Upper Extremity Assessment: Overall WFL for tasks assessed    Lower Extremity Assessment Lower Extremity Assessment: LLE deficits/detail LLE Deficits / Details: lifts antigravity and flexes knee to about 60 (limited by dressing)       Communication   Communication: No difficulties  Cognition Arousal/Alertness: Awake/alert Behavior During Therapy: WFL for tasks assessed/performed Overall Cognitive Status: Within Functional Limits for tasks assessed                                        General  Comments General comments (skin integrity, edema, etc.): HEP handout issued and reviewed    Exercises Amputee Exercises Quad Sets: AROM;5 reps;Left;Supine Towel Squeeze: AROM;5 reps;Left;Seated Hip ABduction/ADduction: AROM;5 reps;Left;Seated Knee Flexion: AROM;5 reps;Left;Seated Knee Extension: AROM;5 reps;Left;Seated Straight Leg Raises: AROM;5 reps;Left;Seated   Assessment/Plan    PT Assessment Patient needs continued PT services   PT Problem List Decreased balance;Decreased knowledge of use of DME;Decreased mobility;Decreased safety awareness;Decreased knowledge of precautions;Decreased activity tolerance       PT Treatment Interventions Therapeutic activities;Gait training;DME instruction;Therapeutic exercise;Patient/family education;Stair training;Functional mobility training;Balance training    PT Goals (Current goals can be found in the Care Plan section)  Acute Rehab PT Goals Patient Stated Goal: to go home PT Goal Formulation: With patient/family Time For Goal Achievement: 10/18/18 Potential to Achieve Goals: Good    Frequency Min 4X/week   Barriers to discharge        Co-evaluation               AM-PAC PT "6 Clicks" Daily Activity  Outcome Measure Difficulty turning over in bed (including adjusting bedclothes, sheets and blankets)?: A Little Difficulty moving from lying on back to sitting on the side of the bed? : A Little Difficulty sitting down on and standing up from a chair with arms (e.g., wheelchair, bedside commode, etc,.)?: Unable Help needed moving to and from a bed to chair (including a wheelchair)?: A Little Help needed walking in hospital room?: A Little Help needed climbing 3-5 steps with a railing? : A Little 6 Click Score: 16    End of Session Equipment Utilized During Treatment: Gait belt Activity Tolerance: Patient tolerated treatment well Patient left: in chair;with call bell/phone within reach;with family/visitor present Nurse Communication: Mobility status PT Visit Diagnosis: Other abnormalities of gait and mobility (R26.89)    Time: 2956-2130 PT Time Calculation (min) (ACUTE ONLY): 26 min   Charges:   PT Evaluation $PT Eval Moderate Complexity: 1 Mod PT Treatments $Gait Training: 8-22 mins        Sheran Lawless, PT Acute Rehabilitation Services [phone] 10/11/2018   Elray Mcgregor 10/11/2018, 1:16 PM

## 2018-10-12 LAB — BASIC METABOLIC PANEL
Anion gap: 6 (ref 5–15)
BUN: 5 mg/dL — AB (ref 6–20)
CHLORIDE: 106 mmol/L (ref 98–111)
CO2: 24 mmol/L (ref 22–32)
CREATININE: 0.97 mg/dL (ref 0.61–1.24)
Calcium: 8.6 mg/dL — ABNORMAL LOW (ref 8.9–10.3)
GFR calc Af Amer: >60 mL/min (ref >60)
GFR calc non Af Amer: >60 mL/min (ref >60)
GLUCOSE: 116 mg/dL — AB (ref 70–99)
POTASSIUM: 4.2 mmol/L (ref 3.5–5.1)
Sodium: 136 mmol/L (ref 135–145)

## 2018-10-12 LAB — CBC
HCT: 31.3 % — ABNORMAL LOW (ref 39.0–52.0)
HEMOGLOBIN: 10.3 g/dL — AB (ref 13.0–17.0)
MCH: 32.7 pg (ref 26.0–34.0)
MCHC: 32.9 g/dL (ref 30.0–36.0)
MCV: 99.4 fL (ref 80.0–100.0)
Platelets: 333 10*3/uL (ref 150–400)
RBC: 3.15 MIL/uL — AB (ref 4.22–5.81)
RDW: 13.1 % (ref 11.5–15.5)
WBC: 4.4 10*3/uL (ref 4.0–10.5)
nRBC: 0 % (ref 0.0–0.2)

## 2018-10-12 LAB — GLUCOSE, CAPILLARY
Glucose-Capillary: 104 mg/dL — ABNORMAL HIGH (ref 70–99)
Glucose-Capillary: 219 mg/dL — ABNORMAL HIGH (ref 70–99)

## 2018-10-12 MED ORDER — OXYCODONE HCL 5 MG PO TABS: 5.0000 mg | ORAL_TABLET | Freq: Four times a day (QID) | ORAL | 0 refills | Status: DC | PRN

## 2018-10-12 MED ORDER — INSULIN ASPART 100 UNIT/ML FLEXPEN: 1.0000 [IU] | PEN_INJECTOR | Freq: Three times a day (TID) | SUBCUTANEOUS | 1 refills | Status: DC

## 2018-10-12 MED ORDER — LOSARTAN POTASSIUM 25 MG PO TABS: 25.0000 mg | ORAL_TABLET | Freq: Every day | ORAL | 1 refills | Status: DC

## 2018-10-12 MED ORDER — ACETAMINOPHEN 325 MG PO TABS: 325.0000 mg | ORAL_TABLET | Freq: Four times a day (QID) | ORAL | Status: DC | PRN

## 2018-10-12 MED ORDER — INSULIN DETEMIR 100 UNIT/ML FLEXPEN: 30.0000 [IU] | PEN_INJECTOR | Freq: Every day | SUBCUTANEOUS | 1 refills | Status: DC

## 2018-10-12 MED FILL — oxyCODONE HCL 5 MG TABS: 5 | 5 days supply | Qty: 20 | Fill #0

## 2018-10-12 NOTE — Progress Notes (Signed)
Orthopedic Tech Progress Note Patient Details:  [name] May 02, 1976 [id number]  Ortho Devices Type of Ortho Device: Crutches Ortho Device/Splint Interventions: Ordered, Application, Adjustment   Post Interventions Patient Tolerated: Well Instructions Provided: Care of device, Adjustment of device   Trinna PostMartinez, Lourie Retz J 10/12/2018, 10:10 AM

## 2018-10-12 NOTE — Progress Notes (Signed)
Occupational Therapy Note  OT eval completed.  Pt able to perform ADLs with min guard assist to min A.  Education completed.  Full write up to follow. Recommend 3in1 commode, and tub transfer bench.   Jeani HawkingWendi Dea Bitting, OTR/L Geologist, engineeringAcute Rehabilitation Services Pager [phone] Office [phone]

## 2018-10-12 NOTE — Evaluation (Signed)
Occupational Therapy Evaluation Patient Details Name: [name] MRN: [id number] Wall DOB: October 19, 1976 Today's Date: 10/12/2018    History of Present Illness Dylan Buffntonio Wall is a 42 y.o. male with medical history significant of diabetes mellitus type 2, on insulin admitted with persistent left foot ulcer with osteomyelitis now s/p L BKA.   Clinical Impression   Patient evaluated by Occupational Therapy with no further acute OT needs identified. All education has been completed and the patient has no further questions. Pt with plan for discharge today.  He is able to complete ADLs with min guard assist.  Provided him with info re: Vocational Rehab.   See below for any follow-up Occupational Therapy or equipment needs. OT is signing off. Thank you for this referral.      Follow Up Recommendations  No OT follow up;Supervision/Assistance - 24 hour    Equipment Recommendations  3 in 1 bedside commode;Tub/shower bench    Recommendations for Other Services       Precautions / Restrictions Precautions Precautions: Fall      Mobility Bed Mobility Overal bed mobility: Modified Independent                Transfers Overall transfer level: Needs assistance Equipment used: Crutches Transfers: Sit to/from [employer] CorporationStand;Stand Pivot Transfers Sit to Stand: Min guard;Mod assist Stand pivot transfers: Min assist;Min guard       General transfer comment: Pt initially required mod A for balance with sit to stand, and min A for transfers using crutches, but with repeated practice was able to perform with min guard assist     Balance Overall balance assessment: Needs assistance   Sitting balance-Leahy Scale: Good     Standing balance support: During functional activity;Single extremity supported Standing balance-Leahy Scale: Poor Standing balance comment: Pt requires UE support and min guard assist.   Upon initial attempt to sit to stand, pt required mod A to maintain balance.  However, with  repeated practice, he was able to progress to min guard assist                            ADL either performed or assessed with clinical judgement   ADL Overall ADL's : Needs assistance/impaired Eating/Feeding: Independent   Grooming: Wash/dry hands;Oral care;Wash/dry face;Brushing hair;Set up;Sitting   Upper Body Bathing: Set up;Sitting   Lower Body Bathing: Set up;Sitting/lateral leans   Upper Body Dressing : Set up;Sitting   Lower Body Dressing: Min guard;Sit to/from stand Lower Body Dressing Details (indicate cue type and reason): Pt able to simulate pulling pants over hips  Toilet Transfer: Min guard;Ambulation;Comfort height toilet;Grab bars(crutches )   Toileting- Clothing Manipulation and Hygiene: Min guard;Sit to/from stand     Tub/Shower Transfer Details (indicate cue type and reason): Pt is hoping to get a tub transfer bench.  He is familiar with how the bench works as his father, who has bil. BKAs uses one.  He was able to verbalize correct usage.   Functional mobility during ADLs: Min guard(crutches )       Vision Baseline Vision/History: No visual deficits Patient Visual Report: No change from baseline       Perception     Praxis      Pertinent Vitals/Pain Pain Assessment: No/denies pain     Hand Dominance Right   Extremity/Trunk Assessment Upper Extremity Assessment Upper Extremity Assessment: Overall WFL for tasks assessed   Lower Extremity Assessment Lower Extremity Assessment: Defer to PT evaluation  Communication Communication Communication: No difficulties   Cognition Arousal/Alertness: Awake/alert Behavior During Therapy: WFL for tasks assessed/performed Overall Cognitive Status: Within Functional Limits for tasks assessed                                     General Comments  Discussed use of back back or fanny pack to carry items when ambulating.  Pt provided with info re: vocational rehab      Exercises     Shoulder Instructions      Home Living Family/patient expects to be discharged to:: Private residence Living Arrangements: Spouse/significant other Available Help at Discharge: Family;Available 24 hours/day Type of Home: Apartment Home Access: Stairs to enter Entrance Stairs-Number of Steps: 4 Entrance Stairs-Rails: Right;Left;Can reach both Home Layout: One level     Bathroom Shower/Tub: Chief Strategy Officer: Standard     Home Equipment: None   Additional Comments: Pt lives with fiancee' who is disabled       Prior Functioning/Environment Level of Independence: Independent        Comments: Pt reports he worked at [employer] and [employer]         OT Problem List: Impaired balance (sitting and/or standing);Decreased knowledge of use of DME or AE;Decreased activity tolerance      OT Treatment/Interventions:      OT Goals(Current goals can be found in the care plan section) Acute Rehab OT Goals Patient Stated Goal: to be able to take care of self  OT Goal Formulation: All assessment and education complete, DC therapy  OT Frequency:     Barriers to D/C:            Co-evaluation              AM-PAC PT "6 Clicks" Daily Activity     Outcome Measure Help from another person eating meals?: None Help from another person taking care of personal grooming?: None Help from another person toileting, which includes using toliet, bedpan, or urinal?: A Little Help from another person bathing (including washing, rinsing, drying)?: A Little Help from another person to put on and taking off regular upper body clothing?: None Help from another person to put on and taking off regular lower body clothing?: A Little 6 Click Score: 21   End of Session Equipment Utilized During Treatment: Gait belt;Other (comment)(crutches ) Nurse Communication: Mobility status  Activity Tolerance: Patient tolerated treatment well Patient left: in bed;with call  bell/phone within reach  OT Visit Diagnosis: Unsteadiness on feet (R26.81)                Time: 4098-1191 OT Time Calculation (min): 30 min Charges:  OT General Charges $OT Visit: 1 Visit OT Evaluation $OT Eval Moderate Complexity: 1 Mod OT Treatments $Self Care/Home Management : 8-22 mins  Jeani Hawking, OTR/L Acute Rehabilitation Services Pager [phone] Office [phone]   Jeani Hawking M 10/12/2018, 2:25 PM

## 2018-10-12 NOTE — Care Management Note (Signed)
Case Management Note  Patient Details  Name: [name] MRN: [id number] Date of Birth: 1975/12/18  Subjective/Objective:                   Admitted with persistent left foot ulcer with osteomyelitis. Hx of diabetes mellitus type 2. Resides with fiance', Rachael. PTA independent with ADL's, no DME usage.       - s/p L BKA, 11/13    Action/Plan: Transition home with home health services to follow.[facility] States has transportation to home.  Expected Discharge Date:  10/12/18               Expected Discharge Plan:  Home/Self Care  In-House Referral:     Discharge planning Services  (P) CM Consult, Follow-up appt scheduled[facility]  Post Acute Care Choice:    Choice offered to:  (P) Patient  DME Arranged:  BSC, rolling walker DME Agency:   [facility], will deliver to bedside prior to d/c pending approval.  [facility] Agency:   RN,PT,SW  Status of Service:   completed  If discussed at Long Length of Stay Meetings, dates discussed:    Additional Comments:  Epifanio LeschesCole, Sonny Anthes Hudson, RN 10/12/2018, 11:31 AM

## 2018-10-12 NOTE — Discharge Instructions (Signed)
Follow with Primary MD in 7 days  ° °Get CBC, CMP, checked  by Primary MD next visit.  ° ° °Activity: As tolerated with Full fall precautions use walker/cane & assistance as needed ° ° °Disposition Home  ° ° °Diet: Heart Healthy , carbohydrate modified , with feeding assistance and aspiration precautions. ° °For Heart failure patients - Check your Weight same time everyday, if you gain over 2 pounds, or you develop in leg swelling, experience more shortness of breath or chest pain, call your Primary MD immediately. Follow Cardiac Low Salt Diet and 1.5 lit/day fluid restriction. ° ° °On your next visit with your primary care physician please Get Medicines reviewed and adjusted. ° ° °Please request your Prim.MD to go over all Hospital Tests and Procedure/Radiological results at the follow up, please get all Hospital records sent to your Prim MD by signing hospital release before you go home. ° ° °If you experience worsening of your admission symptoms, develop shortness of breath, life threatening emergency, suicidal or homicidal thoughts you must seek medical attention immediately by calling 911 or calling your MD immediately  if symptoms less severe. ° °You Must read complete instructions/literature along with all the possible adverse reactions/side effects for all the Medicines you take and that have been prescribed to you. Take any new Medicines after you have completely understood and accpet all the possible adverse reactions/side effects.  ° °Do not drive, operating heavy machinery, perform activities at heights, swimming or participation in water activities or provide baby sitting services if your were admitted for syncope or siezures until you have seen by Primary MD or a Neurologist and advised to do so again. ° °Do not drive when taking Pain medications.  ° ° °Do not take more than prescribed Pain, Sleep and Anxiety Medications ° °Special Instructions: If you have smoked or chewed Tobacco  in the last 2 yrs  please stop smoking, stop any regular Alcohol  and or any Recreational drug use. ° °Wear Seat belts while driving. ° ° °Please note ° °You were cared for by a hospitalist during your hospital stay. If you have any questions about your discharge medications or the care you received while you were in the hospital after you are discharged, you can call the unit and asked to speak with the hospitalist on call if the hospitalist that took care of you is not available. Once you are discharged, your primary care physician will handle any further medical issues. Please note that NO REFILLS for any discharge medications will be authorized once you are discharged, as it is imperative that you return to your primary care physician (or establish a relationship with a primary care physician if you do not have one) for your aftercare needs so that they can reassess your need for medications and monitor your lab values. ° °

## 2018-10-12 NOTE — Progress Notes (Signed)
[name] to be D/C'd to home with home health per MD order.  Discussed with the patient and all questions fully answered.  VSS, Skin clean, dry and intact without evidence of skin break down, no evidence of skin tears noted. IV catheter discontinued intact. Site without signs and symptoms of complications. Dressing and pressure applied.  An After Visit Summary was printed and given to the patient. Patient received prescriptions ([facility]), BSC, rolling walker, and crutches .  D/c education completed with patient/family including follow up instructions, medication list, d/c activities limitations if indicated, with other d/c instructions as indicated by MD - patient able to verbalize understanding, all questions fully answered.   Patient instructed to return to ED, call 911, or call MD for any changes in condition.   Patient escorted via WC, and D/C home via private auto.  [name] 10/12/2018 2:13 PM

## 2018-10-13 LAB — CULTURE, BLOOD (ROUTINE X 2)
CULTURE: NO GROWTH
Culture: NO GROWTH
SPECIAL REQUESTS: ADEQUATE
Special Requests: ADEQUATE

## 2018-10-15 ENCOUNTER — Telehealth (INDEPENDENT_AMBULATORY_CARE_PROVIDER_SITE_OTHER): Payer: Self-pay | Admitting: Orthopedic Surgery

## 2018-10-15 NOTE — Telephone Encounter (Signed)
Pt is s/p a left BKA 10/10/18 called and lm on vm to advise verbal ok for orders below. To call with questions.

## 2018-10-15 NOTE — Telephone Encounter (Signed)
[facility]  Lurena JoinerRebecca  [phone]     Skilled Nursing  2 times a week for one week   1  time a week for 2 weeks

## 2018-10-16 ENCOUNTER — Telehealth (INDEPENDENT_AMBULATORY_CARE_PROVIDER_SITE_OTHER): Payer: Self-pay | Admitting: Orthopedic Surgery

## 2018-10-16 NOTE — Telephone Encounter (Signed)
Pt is s/p a left BKA 10/10/18.  Called and advised verbal ok for PT to see patient.

## 2018-10-16 NOTE — Telephone Encounter (Signed)
[facility] -([facility] 1 wk 3   The [facility] is [phone]

## 2018-10-17 ENCOUNTER — Encounter (INDEPENDENT_AMBULATORY_CARE_PROVIDER_SITE_OTHER): Payer: Self-pay | Admitting: Orthopedic Surgery

## 2018-10-17 ENCOUNTER — Ambulatory Visit (INDEPENDENT_AMBULATORY_CARE_PROVIDER_SITE_OTHER): Payer: Self-pay | Admitting: Orthopedic Surgery

## 2018-10-17 DIAGNOSIS — Z89512 Acquired absence of left leg below knee: Secondary | ICD-10-CM

## 2018-10-17 NOTE — Progress Notes (Signed)
   Post-Op Visit Note   Patient: [name]           Date of Birth: 1976/09/15           MRN: [id number] Visit Date: 10/17/2018 PCP: System, Pcp Not In  Chief Complaint:  Chief Complaint  Patient presents with  . Left Leg - Routine Post Op    Left BKA    HPI:  HPI  The patient is a 42 year old gentleman seen 1 week status post left BKA. Has had one fall. No pain. No concerns. Vac removed today.  Ortho Exam Incision is clean dry and intact. Minimal swelling.   Visit Diagnoses:  1. Left below-knee amputee [facility](HCC)     Plan: begin daily dial soap cleansing. Apply dry   Follow-Up Instructions: Return in about 2 weeks (around 10/31/2018).   Imaging: No results found.  Orders:  No orders of the defined types were placed in this encounter.  No orders of the defined types were placed in this encounter.    PMFS History: Patient Active Problem List   Diagnosis Date Noted  . Left below-knee amputee (HCC) 10/17/2018  . Type 2 diabetes mellitus with foot ulcer (HCC) 10/11/2018  . Osteomyelitis (HCC) 10/08/2018  . Diabetic ulcer of left foot (HCC) 07/05/2018  . Diabetes mellitus (HCC) 07/05/2018  . Elevated MCV 07/05/2018  . Transaminitis 04/18/2012  . Hyponatremia 04/18/2012  . Anemia 04/18/2012  . Alcohol abuse 04/18/2012  . Hypokalemia 04/18/2012  . Pancytopenia 04/18/2012  . Neuropathy 04/18/2012  . DKA 08/16/2010  . PANCREATITIS 08/16/2010   Past Medical History:  Diagnosis Date  . Anemia   . Bleeding    hx internal bleeding 1995 due to accident  . Blood transfusion   . Diabetes mellitus     Family History  Problem Relation Age of Onset  . Arthritis Mother   . Diabetes Mellitus II Father     Past Surgical History:  Procedure Laterality Date  . AMPUTATION Left 10/10/2018   Procedure: LEFT BELOW KNEE AMPUTATION;  Surgeon: Nadara Mustarduda, Marcus V, MD;  [facility];  Service: Orthopedics;  Laterality: Left;  . EXPLORATORY LAPAROTOMY     Social History    Occupational History  . Not on file  Tobacco Use  . Smoking status: Former Smoker    Last attempt to quit: 04/17/1996    Years since quitting: 22.5  . Smokeless tobacco: Never Used  Substance and Sexual Activity  . Alcohol use: Yes    Comment: occ  . Drug use: No  . Sexual activity: Not on file

## 2018-10-18 NOTE — Discharge Summary (Signed)
[name], is a 42 y.o. male  DOB 11-06-1976  MRN [id number].  Admission date:  10/08/2018  Admitting Physician  Shelly Coss, MD  Discharge Date:  10/12/2018  Primary MD  System, Pcp Not In  Recommendations for primary care physician for things to follow:  - follow with ortho as outpatient  Admission Diagnosis  Osteomyelitis of left foot, unspecified type ([place]) [M86.9] Osteomyelitis ([place]) [M86.9]   Discharge Diagnosis  Osteomyelitis of left foot, unspecified type ([place]) [M86.9] Osteomyelitis ([place]) [M86.9]   Principal Problem:   Diabetic ulcer of left foot ([place]) Active Problems:   Hyponatremia   Alcohol abuse   Osteomyelitis ([place])   Type 2 diabetes mellitus with foot ulcer ([place])      Past Medical History:  Diagnosis Date  . Anemia   . Bleeding    hx internal bleeding 1995 due to accident  . Blood transfusion   . Diabetes mellitus     Past Surgical History:  Procedure Laterality Date  . AMPUTATION Left 10/10/2018   Procedure: LEFT BELOW KNEE AMPUTATION;  Surgeon: Newt Minion, MD;  Location: [facility];  Service: Orthopedics;  Laterality: Left;  . EXPLORATORY LAPAROTOMY         History of present illness and  Hospital Course:     Kindly see H&P for history of present illness and admission details, please review complete Labs, Consult reports and Test reports for all details in brief  HPI  from the history and physical done on the day of admission 10/08/2018  HPI: [name] is a 42 y.o. male with medical history significant of diabetes mellitus type 2, on insulin who presents from home to the emergency department with complaints of persistent left foot ulcer and as per referral by his PCP.  Patient developed diabetic foot ulcer on his left side about 2 months ago.  He was following with his PCP and wound care physician.  He was on doxycycline and ciprofloxacin at home.  He  underwent MRI of the left foot which showed osteomyelitis.  And his primary doctor referred him to the emergency department. Patient reported that he was having persistent pain, swelling, foul-smelling drainage from his left foot over the past week.  Also reported of fever and chills at home. Patient seen and examined the bedside in the emergency department.  Currently he is hemodynamically stable.  He denies any chest pain, shortness of breath, palpitation, abdominal pain, dysuria, nausea, vomiting, diarrhea or headache. He says he is compliant with his insulin regimen.   ED Course: Started on broad-spectrum antibiotics.  Blood culture sent.  Orthopedics, ID consults. Marland Kitchen    Hospital Course   42 year old male with past medical controlled, presents with left foot infection, MRI significant for infection/osteomyelitis, seen by orthopedic, with recommendation for transtibial amputation,  went for surgery 10/10/2018 by Dr. Sharol Given, he was kept empirically on vancomycin and Zosyn on admission, was continued until 24 hours after surgery.   Diabetic foot ulcer/osteomyelitis: MRI reported as Open wound on the lateral aspect of  the foot extends right down to the bone of the base of the fifth metatarsal. Diffuse osteomyelitis involving the fifth metatarsal and also the adjacent cuboid.3 cm soft tissue abscess near the open wound and findings suspicious for septic tenosynovitis involving the extensor tendons.Severe cellulitis and myofasciitis without definite findings for pyomyositis.  -Orthopedic consult greatly appreciated, patient does have extensive soft tissue infection, with osteomyelitis, and purulent drainage, and ulceration over the dorsum of the midfoot and plantar aspect of the midfoot, per Sophronia Simas unfortunately he has no salvage options, recommendation is for transtibial amputation, patient agreeable to proceed, and went for transtibial amputation with application of Praveena wound VAC 10/10/2018  by Dr. Sharol Given. -He was kept on IV vancomycin and Zosyn empirically on admission,  no further antibiotics on discharge, to follow with Ortho as an outpatient  Type 2 diabetes mellitus uncontrolled: -He is compliant with insulin regimen, his A1c once he is elevated at 8.9, but actually down from 11.6 three month ago.  Is on Lantus 30 units subcu daily, with good control,  Hyponatremia:  - improving with IV fluids  Alcohol abuse: Past history of alcohol abuse. Patient says he does not drink currently.   Discharge Condition:  stable   Follow UP  Follow-up Information    Newt Minion, MD In 1 week.   Specialty:  Orthopedic Surgery Contact information: [place] [address] [place]. Go on 10/24/2018.   Why:  10:10 am Contact information: [address] [place] [address] [phone]            Discharge Instructions  and  Discharge Medications     Discharge Instructions    Discharge instructions   Complete by:  As directed    Increase activity slowly   Complete by:  As directed      Allergies as of 10/12/2018   No Known Allergies     Medication List    STOP taking these medications   naproxen sodium 220 MG tablet Commonly known as:  ALEVE     TAKE these medications   acetaminophen 325 MG tablet Commonly known as:  TYLENOL Take 1-2 tablets (325-650 mg total) by mouth every 6 (six) hours as needed for mild pain (pain score 1-3 or temp > 100.5).   blood glucose meter kit and supplies Dispense based on patient and insurance preference. Use up to four times daily as directed. (FOR ICD-10 E10.9, E11.9).   insulin aspart 100 UNIT/ML FlexPen Commonly known as:  NOVOLOG Inject 1-10 Units into the skin 3 (three) times daily with meals. Per sliding scale.   Insulin Detemir 100 UNIT/ML Pen Commonly known as:  LEVEMIR Inject 30 Units into the skin  daily.   losartan 25 MG tablet Commonly known as:  COZAAR Take 1 tablet (25 mg total) by mouth daily.   oxyCODONE 5 MG immediate release tablet Commonly known as:  Oxy IR/ROXICODONE Take 1 tablet (5 mg total) by mouth every 6 (six) hours as needed for severe pain.         Diet and Activity recommendation: See Discharge Instructions above   Consults obtained -  ortho   Major procedures and Radiology Reports - PLEASE review detailed and final reports for all details, in brief -      Mr Foot Left W Wo Contrast  Result Date: 10/07/2018 CLINICAL DATA:  Diabetic foot ulcer. Pain and swelling on the lateral aspect  of the foot. EXAM: MRI OF THE LEFT FOREFOOT WITHOUT AND WITH CONTRAST TECHNIQUE: Multiplanar, multisequence MR imaging of the left foot was performed both before and after administration of intravenous contrast. CONTRAST:  10 cc Gadavist COMPARISON:  Radiographs 09/29/2018 FINDINGS: There is an open wound on the lateral aspect of the foot which extends right down to the bone of the base of the fifth metatarsal. There is an adjacent curvilinear soft tissue abscess measuring a maximum of 3 cm. Severe diffuse surrounding cellulitis. A second small rim enhancing fluid collection is closely associated with the lateral extensor tendons and is suspicious for septic tenosynovitis. Abnormal signal intensity and enhancement in the entire fifth metatarsal consistent with osteomyelitis. There is also a focus of osteomyelitis involving the cuboid laterally. I do not see any other obvious foci of osteomyelitis. Diffuse and severe cellulitis and myofasciitis without definite findings for pyomyositis. IMPRESSION: 1. Open wound on the lateral aspect of the foot extends right down to the bone of the base of the fifth metatarsal. Diffuse osteomyelitis involving the fifth metatarsal and also the adjacent cuboid. 2. 3 cm soft tissue abscess near the open wound and findings suspicious for septic  tenosynovitis involving the extensor tendons. 3. Severe cellulitis and myofasciitis without definite findings for pyomyositis. Electronically Signed   By: Marijo Sanes M.D.   On: 10/07/2018 02:06   Dg Foot 2 Views Left  Result Date: 09/30/2018 CLINICAL DATA:  Nonhealing diabetic ulcer on lateral foot. EXAM: LEFT FOOT - 2 VIEW COMPARISON:  July 05, 2018 FINDINGS: The ulcer is identified laterally near the base of the fifth metatarsal. There is high attenuation in the soft tissues just anterior to the ulcer. No bony erosion. Osteopenia. Degenerative changes. No fracture. IMPRESSION: 1. The ulcer is seen laterally near the base of the fifth metatarsal. High attenuation seen in the soft tissues along the anterior aspect of the ulcer, not seen in August 2019, could represent foreign body or soft tissue calcification. Recommend clinical correlation. No bony erosion to suggest osteomyelitis. MRI would be more sensitive in the evaluation for osteomyelitis however. Electronically Signed   By: Dorise Bullion III M.D   On: 09/30/2018 17:11    Micro Results     Recent Results (from the past 240 hour(s))  Surgical pcr screen     Status: None   Collection Time: 10/09/18  8:48 PM  Result Value Ref Range Status   MRSA, PCR NEGATIVE NEGATIVE Final   Staphylococcus aureus NEGATIVE NEGATIVE Final    Comment: (NOTE) The Xpert SA Assay (FDA approved for NASAL specimens in patients 5 years of age and older), is one component of a comprehensive surveillance program. It is not intended to diagnose infection nor to guide or monitor treatment. Performed at [facility], [place] [address], [place], [place] [address]        Today   Subjective:   [name] today has no headache,no chest abdominal pain,no new weakness tingling or numbness, feels much better wants to go home today.   Objective:   Blood pressure (!) 151/88, pulse 71, temperature 98.4 F (36.9 C), temperature source Oral, resp. rate  16, height 6' 0.01" (1.829 m), weight 84 kg, SpO2 100 %.  No intake or output data in the 24 hours ending 10/18/18 1807  Exam Awake Alert, Oriented x 3, No new F.N deficits, Normal affect Symmetrical Chest wall movement, Good air movement bilaterally, CTAB RRR,No Gallops,Rubs or new Murmurs, No Parasternal Heave +ve B.Sounds, Abd Soft, Non  tender, No organomegaly appriciated, No rebound -guarding or rigidity. No Cyanosis, Clubbing or edema in the right lower extremity, status post left BKA left lower extremity, with wound VAC inside  Data Review   CBC w Diff:  Lab Results  Component Value Date   WBC 4.4 10/12/2018   HGB 10.3 (L) 10/12/2018   HCT 31.3 (L) 10/12/2018   PLT 333 10/12/2018   LYMPHOPCT 9 10/08/2018   MONOPCT 10 10/08/2018   EOSPCT 0 10/08/2018   BASOPCT 0 10/08/2018    CMP:  Lab Results  Component Value Date   NA 136 10/12/2018   K 4.2 10/12/2018   CL 106 10/12/2018   CO2 24 10/12/2018   BUN 5 (L) 10/12/2018   CREATININE 0.97 10/12/2018   PROT 7.5 07/06/2018   ALBUMIN 3.3 (L) 07/06/2018   BILITOT 0.9 07/06/2018   ALKPHOS 70 07/06/2018   AST 22 07/06/2018   ALT 24 07/06/2018  .   Total Time in preparing paper work, data evaluation and todays exam - 30 minutes  Phillips Climes M.D on 10/18/2018 at 6:07 PM  [facility]   Office  [phone]

## 2018-10-19 ENCOUNTER — Telehealth (INDEPENDENT_AMBULATORY_CARE_PROVIDER_SITE_OTHER): Payer: Self-pay | Admitting: Orthopedic Surgery

## 2018-10-19 NOTE — Telephone Encounter (Signed)
Baird Lyonsasey, nurse at [facility] is wanting to know about wound care orders for this patient. She knows he got wound vac off yesterday and wants to know if she should resume dry dress with an ace wrap? Please advise # [phone]

## 2018-10-22 ENCOUNTER — Encounter (INDEPENDENT_AMBULATORY_CARE_PROVIDER_SITE_OTHER): Payer: Self-pay

## 2018-10-22 NOTE — Telephone Encounter (Signed)
Called and lm on vm for pt to wash daily with dial soap and water and to apply shrinker making direct contact with the skin. To call with any questions.

## 2018-10-24 ENCOUNTER — Telehealth: Payer: Self-pay

## 2018-10-24 ENCOUNTER — Encounter: Payer: Self-pay | Admitting: Nurse Practitioner

## 2018-10-24 ENCOUNTER — Ambulatory Visit: Payer: Self-pay | Attending: Nurse Practitioner | Admitting: Nurse Practitioner

## 2018-10-24 VITALS — BP 113/70 | HR 94 | Temp 98.3°F | Ht 72.0 in

## 2018-10-24 DIAGNOSIS — Z79899 Other long term (current) drug therapy: Secondary | ICD-10-CM | POA: Insufficient documentation

## 2018-10-24 DIAGNOSIS — E119 Type 2 diabetes mellitus without complications: Secondary | ICD-10-CM | POA: Insufficient documentation

## 2018-10-24 DIAGNOSIS — IMO0002 Reserved for concepts with insufficient information to code with codable children: Secondary | ICD-10-CM

## 2018-10-24 DIAGNOSIS — E118 Type 2 diabetes mellitus with unspecified complications: Secondary | ICD-10-CM

## 2018-10-24 DIAGNOSIS — I10 Essential (primary) hypertension: Secondary | ICD-10-CM

## 2018-10-24 DIAGNOSIS — Z89512 Acquired absence of left leg below knee: Secondary | ICD-10-CM | POA: Insufficient documentation

## 2018-10-24 DIAGNOSIS — E1165 Type 2 diabetes mellitus with hyperglycemia: Secondary | ICD-10-CM

## 2018-10-24 DIAGNOSIS — Z794 Long term (current) use of insulin: Secondary | ICD-10-CM | POA: Insufficient documentation

## 2018-10-24 DIAGNOSIS — F320 Major depressive disorder, single episode, mild: Secondary | ICD-10-CM

## 2018-10-24 DIAGNOSIS — Z833 Family history of diabetes mellitus: Secondary | ICD-10-CM | POA: Insufficient documentation

## 2018-10-24 DIAGNOSIS — F329 Major depressive disorder, single episode, unspecified: Secondary | ICD-10-CM | POA: Insufficient documentation

## 2018-10-24 LAB — GLUCOSE, POCT (MANUAL RESULT ENTRY): POC Glucose: 218 mg/dL — AB (ref 70–99)

## 2018-10-24 MED ORDER — TRUEPLUS LANCETS 28G MISC: 3 refills | Status: DC

## 2018-10-24 MED ORDER — GLUCOSE BLOOD VI STRP: ORAL_STRIP | 12 refills | Status: DC

## 2018-10-24 MED ORDER — VENLAFAXINE HCL ER 37.5 MG PO CP24: 37.5000 mg | ORAL_CAPSULE | Freq: Every day | ORAL | 1 refills | Status: DC

## 2018-10-24 MED ORDER — INSULIN ASPART 100 UNIT/ML FLEXPEN: 1.0000 [IU] | PEN_INJECTOR | Freq: Three times a day (TID) | SUBCUTANEOUS | 1 refills | Status: DC

## 2018-10-24 MED FILL — TRUEplus LANCETS 28G MISC: 30 days supply | Qty: 100 | Fill #0

## 2018-10-24 MED FILL — VENLAFAXINE HCL ER 37.5 MG: 37.5 | 30 days supply | Qty: 30 | Fill #0

## 2018-10-24 MED FILL — NOVOLOG FLEXPEN SYRINGE: 100 | 30 days supply | Qty: 6 | Fill #0

## 2018-10-24 MED FILL — TRUE METRIX TEST STRIP: 30 days supply | Qty: 100 | Fill #0

## 2018-10-24 NOTE — Progress Notes (Signed)
Assessment & Plan:  [name] was seen today for hospitalization follow-up.  Diagnoses and all orders for this visit:  Diabetes mellitus type 2, uncontrolled, with complications (HCC) -     Glucose (CBG) -     TRUEPLUS LANCETS 28G MISC; Use as instructed. Monitor blood glucose levels three times per day. -     glucose blood (TRUE METRIX BLOOD GLUCOSE TEST) test strip; Use as instructed. Monitor blood glucose levels three times per day. -     insulin aspart (NOVOLOG FLEXPEN) 100 UNIT/ML FlexPen; Inject 1-10 Units into the skin 3 (three) times daily with meals. Per sliding scale. -     Microalbumin/Creatinine Ratio, Urine Continue blood sugar control as discussed in office today, low carbohydrate diet, and regular physical exercise as tolerated, 150 minutes per week (30 min each day, 5 days per week, or 50 min 3 days per week). Keep blood sugar logs with fasting goal of 90-130 mg/dl, post prandial (after you eat) less than 180.  For Hypoglycemia: BS <60 and Hyperglycemia BS >400; contact the clinic ASAP. Annual eye exams and foot exams are recommended.   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  Elevated Blood Pressure Continue all antihypertensives as prescribed.  Remember to bring in your blood pressure log with you for your follow up appointment.  DASH/Mediterranean Diets are healthier choices for HTN.     Patient has been counseled on age-appropriate routine health concerns for screening and prevention. These are reviewed and up-to-date. Referrals have been placed accordingly. Immunizations are up-to-date or declined.    Subjective:   Chief Complaint  Patient presents with  . Hospitalization Follow-up    Pt. is here for a follow-up.    HPI [name] 42 y.o. male presents to office today to establish care. He has a history  of DM Type 2. He is out of his medications due to cost. Patient has been advised to apply for financial assistance and schedule to see our financial counselor.   He is s/p L BKA secondary to osteomyelitis from diabetic foot ulcer 10-10-2018. Currently being followed by Dr. Sharol Given Orthopedic surgeon.    DM II Chronic. Diagnosed 12 years ago. Monitoring blood glucose levels TID. Average Fasting: 160s. Denies any hypo or hyperglycemic symptoms. Taking levemir 30 units daily and Novolog SSI. A1c has been as high as 11.6 in August 2019. He is aware that poorly controlled diabetes, dietary and medication noncompliance can lead to additional amputations, kidney failure, heart disease, stroke and death.  Lab Results  Component Value Date   HGBA1C 8.9 (H) 10/08/2018    Elevated Blood Pressure Readings have been elevated at times as well as normal. Currently taking losartan '25mg'$  daily as prescribed. He denies chest pain, shortness of breath, palpitations, lightheadedness, dizziness, headaches or BLE edema.  BP Readings from Last 3 Encounters:  10/24/18 113/70  10/12/18 (!) 151/88  07/09/18 134/89    Depression Chronic symptoms of depression and anxiety however he never taken an SSRI for his mood lability. Will start effexor today and have him return in a few weeks to ascertain improvement in mood. He currently denies any suicidal ideation.  Depression screen PHQ 2/9 10/24/2018  Decreased Interest 1  Down, Depressed, Hopeless 3  PHQ - 2 Score 4  Altered sleeping 3  Tired, decreased energy 3  Change  in appetite 3  Trouble concentrating 0  Moving slowly or fidgety/restless 1  Suicidal thoughts 0  PHQ-9 Score 14   GAD 7 : Generalized Anxiety Score 10/24/2018  Nervous, Anxious, on Edge 3  Control/stop worrying 3  Worry too much - different things 3  Trouble relaxing 3  Restless 2  Easily annoyed or irritable 3  Afraid - awful might happen 3  Total GAD 7 Score 20     Review of Systems    Constitutional: Negative for fever, malaise/fatigue and weight loss.  HENT: Negative.  Negative for nosebleeds.   Eyes: Negative.  Negative for blurred vision, double vision and photophobia.  Respiratory: Negative.  Negative for cough and shortness of breath.   Cardiovascular: Negative.  Negative for chest pain, palpitations and leg swelling.  Gastrointestinal: Negative.  Negative for heartburn, nausea and vomiting.  Musculoskeletal: Negative.  Negative for myalgias.  Neurological: Negative.  Negative for dizziness, focal weakness, seizures and headaches.  Psychiatric/Behavioral: Positive for depression. Negative for suicidal ideas. The patient is nervous/anxious and has insomnia.     Past Medical History:  Diagnosis Date  . Anemia   . Bleeding    hx internal bleeding 1995 due to accident  . Blood transfusion   . Diabetes mellitus     Past Surgical History:  Procedure Laterality Date  . AMPUTATION Left 10/10/2018   Procedure: LEFT BELOW KNEE AMPUTATION;  Surgeon: Newt Minion, MD;  Location: [facility];  Service: Orthopedics;  Laterality: Left;  . EXPLORATORY LAPAROTOMY      Family History  Problem Relation Age of Onset  . Arthritis Mother   . Diabetes Mellitus II Father     Social History Reviewed with no changes to be made today.   Outpatient Medications Prior to Visit  Medication Sig Dispense Refill  . blood glucose meter kit and supplies Dispense based on patient and insurance preference. Use up to four times daily as directed. (FOR ICD-10 E10.9, E11.9). 1 each 0  . Insulin Detemir (LEVEMIR FLEXTOUCH) 100 UNIT/ML Pen Inject 30 Units into the skin daily. 15 mL 1  . losartan (COZAAR) 25 MG tablet Take 1 tablet (25 mg total) by mouth daily. 30 tablet 1  . oxyCODONE (OXY IR/ROXICODONE) 5 MG immediate release tablet Take 1 tablet (5 mg total) by mouth every 6 (six) hours as needed for severe pain. 20 tablet 0  . insulin aspart (NOVOLOG FLEXPEN) 100 UNIT/ML FlexPen Inject 1-10  Units into the skin 3 (three) times daily with meals. Per sliding scale. 15 mL 1  . acetaminophen (TYLENOL) 325 MG tablet Take 1-2 tablets (325-650 mg total) by mouth every 6 (six) hours as needed for mild pain (pain score 1-3 or temp > 100.5). (Patient not taking: Reported on 10/24/2018)     No facility-administered medications prior to visit.     No Known Allergies     Objective:    BP 113/70 (BP Location: Right Arm, Patient Position: Sitting, Cuff Size: Normal)   Pulse 94   Temp 98.3 F (36.8 C) (Oral)   Ht 6' (1.829 m)   SpO2 99%   BMI 25.12 kg/m  Wt Readings from Last 3 Encounters:  10/17/18 185 lb 3 oz (84 kg)  10/10/18 185 lb 3 oz (84 kg)  07/05/18 215 lb (97.5 kg)    Physical Exam  Constitutional: He is oriented to person, place, and time. He appears well-developed and well-nourished. He is cooperative.  HENT:  Head: Normocephalic and atraumatic.  Eyes: EOM are  normal.  Neck: Normal range of motion.  Cardiovascular: Normal rate, regular rhythm, normal heart sounds and intact distal pulses. Exam reveals no gallop and no friction rub.  No murmur heard. Pulmonary/Chest: Effort normal and breath sounds normal. No tachypnea. No respiratory distress. He has no decreased breath sounds. He has no wheezes. He has no rhonchi. He has no rales. He exhibits no tenderness.  Abdominal: Bowel sounds are normal.  Musculoskeletal: Normal range of motion. He exhibits no edema.  Neurological: He is alert and oriented to person, place, and time. Coordination normal.  Skin: Skin is warm and dry.  Psychiatric: He has a normal mood and affect. His behavior is normal. Judgment and thought content normal.  Nursing note and vitals reviewed.        Patient has been counseled extensively about nutrition and exercise as well as the importance of adherence with medications and regular follow-up. The patient was given clear instructions to go to ER or return to medical center if symptoms don't  improve, worsen or new problems develop. The patient verbalized understanding.   Follow-up: Return in about 6 weeks (around 12/05/2018) for depression; bring meter for diabetes follow up.   Gildardo Pounds, FNP-BC [facility] [place], [place]   10/29/2018, 12:33 AM

## 2018-10-24 NOTE — Telephone Encounter (Signed)
Met with the patient at the request of [name], FNP.  He was interested in completing a SCAT application but was not able to stay to complete it.  Provided him with application and instructed him to return it to this CM when completed. He was accompanied by his fiance

## 2018-10-25 LAB — MICROALBUMIN / CREATININE URINE RATIO
Creatinine, Urine: 78.2 mg/dL
MICROALB/CREAT RATIO: 19.2 mg/g{creat} (ref 0.0–30.0)
MICROALBUM., U, RANDOM: 15 ug/mL

## 2018-10-29 ENCOUNTER — Encounter: Payer: Self-pay | Admitting: Nurse Practitioner

## 2018-10-29 MED ORDER — LOSARTAN POTASSIUM 25 MG PO TABS: 12.5000 mg | ORAL_TABLET | Freq: Every day | ORAL | 3 refills | Status: DC

## 2018-10-31 ENCOUNTER — Ambulatory Visit (INDEPENDENT_AMBULATORY_CARE_PROVIDER_SITE_OTHER): Payer: Self-pay | Admitting: Orthopedic Surgery

## 2018-10-31 ENCOUNTER — Encounter (INDEPENDENT_AMBULATORY_CARE_PROVIDER_SITE_OTHER): Payer: Self-pay | Admitting: Orthopedic Surgery

## 2018-10-31 ENCOUNTER — Telehealth: Payer: Self-pay

## 2018-10-31 VITALS — Ht 72.0 in | Wt 185.2 lb

## 2018-10-31 DIAGNOSIS — Z89512 Acquired absence of left leg below knee: Secondary | ICD-10-CM

## 2018-10-31 NOTE — Telephone Encounter (Signed)
CMA spoke to patient informing on results.  Pt. Verified DOB. Pt. Understood.   Pt. Stated he cannot afford to get his medication and will get it when he can.

## 2018-10-31 NOTE — Telephone Encounter (Signed)
-----   Message from Claiborne RiggZelda W Fleming, NP sent at 10/29/2018 12:37 AM EST ----- Although your Microalbumin at this time does not show any diabetic kidney damage. I will send in a prescription for a half tablet of losartan for you to take as this is recommended by the american diabetes association to help protect your kidneys from diabetes.

## 2018-10-31 NOTE — Progress Notes (Signed)
   Post-Op Visit Note   Patient: [name]           Date of Birth: [birth year]           MRN: [id number] Visit Date: 10/31/2018 PCP: Claiborne RiggFleming, Zelda W, NP  Chief Complaint:  Chief Complaint  Patient presents with  . Left Leg - Follow-up    Left BKA    HPI:  HPI The patient is a 42 year old gentleman seen 4 weeks status post left bka. Sutures in place.  Ortho Exam Incision well healed. There is no erythema. No drainage. No sign of infection. Consolidating well.  Visit Diagnoses:  1. Left below-knee amputee (HCC)     Plan: follow up in 3 weeks. [facility] be working [facility] [employer] [facility]. Provided order for prosthesis for him.  Follow-Up Instructions: No follow-ups on file.   Imaging: No results found.  Orders:  No orders of the defined types were placed in this encounter.  No orders of the defined types were placed in this encounter.    PMFS History: Patient Active Problem List   Diagnosis Date Noted  . Left below-knee amputee (HCC) 10/17/2018  . Osteomyelitis (HCC) 10/08/2018  . Diabetic ulcer of left foot (HCC) 07/05/2018  . Diabetes mellitus (HCC) 07/05/2018  . Elevated MCV 07/05/2018  . Transaminitis 04/18/2012  . Hyponatremia 04/18/2012  . Anemia 04/18/2012  . Alcohol abuse 04/18/2012  . Hypokalemia 04/18/2012  . Pancytopenia 04/18/2012  . Neuropathy 04/18/2012  . DKA 08/16/2010  . PANCREATITIS 08/16/2010   Past Medical History:  Diagnosis Date  . Anemia   . Bleeding    hx internal bleeding 1995 due to accident  . Blood transfusion   . Diabetes mellitus     Family History  Problem Relation Age of Onset  . Arthritis Mother   . Diabetes Mellitus II Father     Past Surgical History:  Procedure Laterality Date  . AMPUTATION Left 10/10/2018   Procedure: LEFT BELOW KNEE AMPUTATION;  Surgeon: Nadara Mustarduda, Marcus V, MD;  [facility];  Service: Orthopedics;  Laterality: Left;  . EXPLORATORY LAPAROTOMY     Social History   Occupational History  .  Not on file  Tobacco Use  . Smoking status: Former Smoker    Last attempt to quit: 04/17/1996    Years since quitting: 22.5  . Smokeless tobacco: Never Used  Substance and Sexual Activity  . Alcohol use: Not Currently    Comment: occ  . Drug use: Not Currently  . Sexual activity: Yes

## 2018-11-26 MED FILL — TRUE METRIX TEST STRIP: 30 days supply | Qty: 100 | Fill #0

## 2018-11-26 MED FILL — !LEVEMIR FLEXPEN 100UNITS/M: 100U/ML (3) | 20 days supply | Qty: 6 | Fill #0

## 2018-11-26 MED FILL — !NOVOLOG FLEXPEN SYRINGE 1: 100/ML | 30 days supply | Qty: 6 | Fill #0

## 2018-11-26 MED FILL — TRUEplus LANCETS 28G MISC: 30 days supply | Qty: 100 | Fill #0

## 2018-11-29 ENCOUNTER — Ambulatory Visit (INDEPENDENT_AMBULATORY_CARE_PROVIDER_SITE_OTHER): Payer: Self-pay | Admitting: Orthopedic Surgery

## 2018-11-29 ENCOUNTER — Encounter (INDEPENDENT_AMBULATORY_CARE_PROVIDER_SITE_OTHER): Payer: Self-pay | Admitting: Orthopedic Surgery

## 2018-11-29 VITALS — Ht 72.0 in | Wt 185.0 lb

## 2018-11-29 DIAGNOSIS — Z89512 Acquired absence of left leg below knee: Secondary | ICD-10-CM

## 2018-12-04 ENCOUNTER — Encounter (INDEPENDENT_AMBULATORY_CARE_PROVIDER_SITE_OTHER): Payer: Self-pay | Admitting: Orthopedic Surgery

## 2018-12-04 NOTE — Progress Notes (Signed)
Office Visit Note   Patient: [name]           Date of Birth: Apr 21, 1976           MRN: [id number] Visit Date: 11/29/2018              Requested by: Claiborne RiggFleming, Zelda W, NP [address] [place], [place] [id number] PCP: Claiborne RiggFleming, Zelda W, NP  Chief Complaint  Patient presents with  . Left Leg - Routine Post Op    10/10/18 left BKA      HPI: Patient is a 43 year old gentleman who is 6 weeks out from a left transtibial amputation.  Patient has Medicaid pending and is awaiting this for prosthetic fitting.  Assessment & Plan: Visit Diagnoses:  1. Left below-knee amputee [facility](HCC)     Plan: Continue with the Ace wrap for compression follow-up with biotech when Medicaid approves the prosthesis.  Follow-Up Instructions: Return in about 4 weeks (around 12/27/2018).   Ortho Exam  Patient is alert, oriented, no adenopathy, well-dressed, normal affect, normal respiratory effort. Examination of the residual limb is well healed well consolidated there is no drainage no cellulitis no tenderness to palpation no signs of infection.  Imaging: No results found. No images are attached to the encounter.  Labs: Lab Results  Component Value Date   HGBA1C 8.9 (H) 10/08/2018   HGBA1C 11.6 (H) 07/05/2018   HGBA1C 10.6 (H) 04/18/2012   ESRSEDRATE 102 (H) 07/05/2018   CRP 3.7 (H) 07/05/2018   REPTSTATUS 10/13/2018 FINAL 10/08/2018   GRAMSTAIN  09/06/2018    FEW WBC PRESENT, PREDOMINANTLY MONONUCLEAR FEW GRAM POSITIVE COCCI Performed at [facility], [address] [place], [place] [address]    CULT  10/08/2018    NO GROWTH 5 DAYS Performed at [facility], [address] [place], [place] [address]    [facility] 09/06/2018   LABORGA CITROBACTER KOSERI 09/06/2018     Lab Results  Component Value Date   ALBUMIN 3.3 (L) 07/06/2018   ALBUMIN 3.9 07/30/2012   ALBUMIN 2.3 (L) 04/19/2012   PREALBUMIN 12.8 (L) 07/05/2018    Body mass index is 25.09  kg/m.  Orders:  No orders of the defined types were placed in this encounter.  No orders of the defined types were placed in this encounter.    Procedures: No procedures performed  Clinical Data: No additional findings.  ROS:  All other systems negative, except as noted in the HPI. Review of Systems  Objective: Vital Signs: Ht 6' (1.829 m)   Wt 185 lb (83.9 kg)   BMI 25.09 kg/m   Specialty Comments:  No specialty comments available.  PMFS History: Patient Active Problem List   Diagnosis Date Noted  . Left below-knee amputee (HCC) 10/17/2018  . Osteomyelitis (HCC) 10/08/2018  . Diabetic ulcer of left foot (HCC) 07/05/2018  . Diabetes mellitus (HCC) 07/05/2018  . Elevated MCV 07/05/2018  . Transaminitis 04/18/2012  . Hyponatremia 04/18/2012  . Anemia 04/18/2012  . Alcohol abuse 04/18/2012  . Hypokalemia 04/18/2012  . Pancytopenia 04/18/2012  . Neuropathy 04/18/2012  . DKA 08/16/2010  . PANCREATITIS 08/16/2010   Past Medical History:  Diagnosis Date  . Anemia   . Bleeding    hx internal bleeding 1995 due to accident  . Blood transfusion   . Diabetes mellitus     Family History  Problem Relation Age of Onset  . Arthritis Mother   . Diabetes Mellitus II Father  Past Surgical History:  Procedure Laterality Date  . AMPUTATION Left 10/10/2018   Procedure: LEFT BELOW KNEE AMPUTATION;  Surgeon: Nadara Mustarduda, Marcus V, MD;  [facility];  Service: Orthopedics;  Laterality: Left;  . EXPLORATORY LAPAROTOMY     Social History   Occupational History  . Not on file  Tobacco Use  . Smoking status: Former Smoker    Last attempt to quit: 04/17/1996    Years since quitting: 22.6  . Smokeless tobacco: Never Used  Substance and Sexual Activity  . Alcohol use: Not Currently    Comment: occ  . Drug use: Not Currently  . Sexual activity: Yes

## 2018-12-05 ENCOUNTER — Ambulatory Visit: Payer: Self-pay | Attending: Nurse Practitioner | Admitting: Nurse Practitioner

## 2018-12-05 ENCOUNTER — Telehealth: Payer: Self-pay

## 2018-12-05 ENCOUNTER — Encounter: Payer: Self-pay | Admitting: Nurse Practitioner

## 2018-12-05 VITALS — BP 127/88 | HR 90 | Temp 98.4°F | Resp 16 | Wt 172.6 lb

## 2018-12-05 DIAGNOSIS — E118 Type 2 diabetes mellitus with unspecified complications: Secondary | ICD-10-CM

## 2018-12-05 DIAGNOSIS — Z794 Long term (current) use of insulin: Secondary | ICD-10-CM | POA: Insufficient documentation

## 2018-12-05 DIAGNOSIS — Z79899 Other long term (current) drug therapy: Secondary | ICD-10-CM | POA: Insufficient documentation

## 2018-12-05 DIAGNOSIS — IMO0002 Reserved for concepts with insufficient information to code with codable children: Secondary | ICD-10-CM

## 2018-12-05 DIAGNOSIS — Z833 Family history of diabetes mellitus: Secondary | ICD-10-CM | POA: Insufficient documentation

## 2018-12-05 DIAGNOSIS — F329 Major depressive disorder, single episode, unspecified: Secondary | ICD-10-CM | POA: Insufficient documentation

## 2018-12-05 DIAGNOSIS — E1165 Type 2 diabetes mellitus with hyperglycemia: Secondary | ICD-10-CM

## 2018-12-05 DIAGNOSIS — Z89512 Acquired absence of left leg below knee: Secondary | ICD-10-CM

## 2018-12-05 DIAGNOSIS — F320 Major depressive disorder, single episode, mild: Secondary | ICD-10-CM

## 2018-12-05 LAB — GLUCOSE, POCT (MANUAL RESULT ENTRY)
POC GLUCOSE: 119 mg/dL — AB (ref 70–99)
POC GLUCOSE: 55 mg/dL — AB (ref 70–99)

## 2018-12-05 MED ORDER — TRUEPLUS LANCETS 28G MISC: 3 refills | Status: DC

## 2018-12-05 MED ORDER — GLUCOSE BLOOD VI STRP: ORAL_STRIP | 12 refills | Status: DC

## 2018-12-05 MED ORDER — VENLAFAXINE HCL ER 37.5 MG PO CP24: 37.5000 mg | ORAL_CAPSULE | Freq: Every day | ORAL | 1 refills | Status: DC

## 2018-12-05 MED ORDER — TRUE METRIX METER W/DEVICE KIT: PACK | 0 refills | Status: DC

## 2018-12-05 MED FILL — NOVOLOG FLEXPEN SYRINGE: 100 | 20 days supply | Qty: 6 | Fill #0

## 2018-12-05 MED FILL — !LEVEMIR FLEXPEN 100UNITS/M: 100U/ML (3) | 20 days supply | Qty: 6 | Fill #0

## 2018-12-05 NOTE — Progress Notes (Signed)
Assessment & Plan:  [name] was seen today for follow-up.  Diagnoses and all orders for this visit:  Current mild episode of major depressive disorder without prior episode (HCC) -     venlafaxine XR (EFFEXOR XR) 37.5 MG 24 hr capsule; Take 1 capsule (37.5 mg total) by mouth daily with breakfast.  Diabetes mellitus type 2, uncontrolled, with complications (HCC) -     POCT glucose (manual entry) -     glucose blood (TRUE METRIX BLOOD GLUCOSE TEST) test strip; Use as instructed. Monitor blood glucose levels three times per day. -     Blood Glucose Monitoring Suppl (TRUE METRIX METER) w/Device KIT; Use as instructed -     TRUEPLUS LANCETS 28G MISC; Use as instructed. Monitor blood glucose levels three times per day. -     Glucose (CBG) Chronic. Not well controlled. Continue blood sugar control as discussed in office today, low carbohydrate diet, and regular physical exercise as tolerated, 150 minutes per week (30 min each day, 5 days per week, or 50 min 3 days per week). Keep blood sugar logs with fasting goal of 90-130 mg/dl, post prandial (after you eat) less than 180.  For Hypoglycemia: BS <60 and Hyperglycemia BS >400; contact the clinic ASAP. Annual eye exams and foot exams are recommended.    Patient has been counseled on age-appropriate routine health concerns for screening and prevention. These are reviewed and up-to-date. Referrals have been placed accordingly. Immunizations are up-to-date or declined.    Subjective:   Chief Complaint  Patient presents with  . Follow-up    depression   HPI [name] 43 y.o. male presents to office today for follow up to Depression however he states he was not able to pick up his effexor 37.101m at his last office visit in November. PHQ9 score has not changed since his last visit. He currently denies any thoughts of self harm. Will fill effexor today.  [facility] 12/05/2018 10/24/2018  Decreased Interest 1 1  Down, Depressed,  Hopeless 3 3  PHQ - 2 Score 4 4  Altered sleeping 3 3  Tired, decreased energy 3 3  Change in appetite 1 3  Feeling bad or failure about yourself  2 -  Trouble concentrating - 0  Moving slowly or fidgety/restless 1 1  Suicidal thoughts 0 0  PHQ-9 Score 14 14   GAD 7 : Generalized Anxiety Score 12/05/2018 10/24/2018  Nervous, Anxious, on Edge 3 3  Control/stop worrying 3 3  Worry too much - different things 3 3  Trouble relaxing 3 3  Restless 2 2  Easily annoyed or irritable 3 3  Afraid - awful might happen 2 3  Total GAD 7 Score 19 20    DM TYPE 2 He does not have his home meter or a log with him today. States he ran out of strips "a long time ago". He denies any hypo or hyperglycemic symptoms. Current medication regimen includes Levemir 30 units daily and Novolog 1-10 units TID SSI. He is overdue for eye exam. Patient has been advised to apply for financial assistance and schedule to see our financial counselor.  Lab Results  Component Value Date   HGBA1C 8.9 (H) 10/08/2018    Review of Systems  Constitutional: Negative for fever, malaise/fatigue and weight loss.  HENT: Negative.  Negative for nosebleeds.   Eyes: Negative.  Negative for blurred vision, double vision and photophobia.  Respiratory: Negative.  Negative for cough and shortness of breath.  Cardiovascular: Negative.  Negative for chest pain, palpitations and leg swelling.  Gastrointestinal: Negative.  Negative for heartburn, nausea and vomiting.  Musculoskeletal: Negative for myalgias.       L BKA  Neurological: Negative.  Negative for dizziness, focal weakness, seizures and headaches.  Psychiatric/Behavioral: Positive for depression. Negative for suicidal ideas. The patient is nervous/anxious.     Past Medical History:  Diagnosis Date  . Anemia   . Bleeding    hx internal bleeding 1995 due to accident  . Blood transfusion   . Diabetes mellitus     Past Surgical History:  Procedure Laterality Date  .  AMPUTATION Left 10/10/2018   Procedure: LEFT BELOW KNEE AMPUTATION;  Surgeon: Newt Minion, MD;  Location: [place];  Service: Orthopedics;  Laterality: Left;  . EXPLORATORY LAPAROTOMY      Family History  Problem Relation Age of Onset  . Arthritis Mother   . Diabetes Mellitus II Father     Social History Reviewed with no changes to be made today.   Outpatient Medications Prior to Visit  Medication Sig Dispense Refill  . acetaminophen (TYLENOL) 325 MG tablet Take 1-2 tablets (325-650 mg total) by mouth every 6 (six) hours as needed for mild pain (pain score 1-3 or temp > 100.5).    Marland Kitchen insulin aspart (NOVOLOG FLEXPEN) 100 UNIT/ML FlexPen Inject 1-10 Units into the skin 3 (three) times daily with meals. Per sliding scale. 15 mL 1  . Insulin Detemir (LEVEMIR FLEXTOUCH) 100 UNIT/ML Pen Inject 30 Units into the skin daily. 15 mL 1  . losartan (COZAAR) 25 MG tablet Take 0.5 tablets (12.5 mg total) by mouth daily. 45 tablet 3  . oxyCODONE (OXY IR/ROXICODONE) 5 MG immediate release tablet Take 1 tablet (5 mg total) by mouth every 6 (six) hours as needed for severe pain. 20 tablet 0  . blood glucose meter kit and supplies Dispense based on patient and insurance preference. Use up to four times daily as directed. (FOR ICD-10 E10.9, E11.9). 1 each 0  . glucose blood (TRUE METRIX BLOOD GLUCOSE TEST) test strip Use as instructed. Monitor blood glucose levels three times per day. 100 each 12  . TRUEPLUS LANCETS 28G MISC Use as instructed. Monitor blood glucose levels three times per day. 100 each 3  . venlafaxine XR (EFFEXOR XR) 37.5 MG 24 hr capsule Take 1 capsule (37.5 mg total) by mouth daily with breakfast. 30 capsule 1   No facility-administered medications prior to visit.     No Known Allergies     Objective:    BP 127/88   Pulse 90   Temp 98.4 F (36.9 C) (Oral)   Resp 16   Wt 172 lb 9.6 oz (78.3 kg)   SpO2 100%   BMI 23.41 kg/m  Wt Readings from Last 3 Encounters:  12/05/18 172 lb  9.6 oz (78.3 kg)  11/29/18 185 lb (83.9 kg)  10/31/18 185 lb 3 oz (84 kg)    Physical Exam Vitals signs and nursing note reviewed.  Constitutional:      Appearance: He is well-developed.  HENT:     Head: Normocephalic and atraumatic.  Neck:     Musculoskeletal: Normal range of motion.  Cardiovascular:     Rate and Rhythm: Normal rate and regular rhythm.     Heart sounds: Normal heart sounds. No murmur. No friction rub. No gallop.   Pulmonary:     Effort: Pulmonary effort is normal. No tachypnea or respiratory distress.  Breath sounds: Normal breath sounds. No decreased breath sounds, wheezing, rhonchi or rales.  Chest:     Chest wall: No tenderness.  Abdominal:     General: Bowel sounds are normal.     Palpations: Abdomen is soft.  Musculoskeletal: Normal range of motion.     Left knee: He exhibits deformity.     Comments: L BKA  Skin:    General: Skin is warm and dry.  Neurological:     Mental Status: He is alert and oriented to person, place, and time.     Coordination: Coordination normal.  Psychiatric:        Behavior: Behavior normal. Behavior is cooperative.        Thought Content: Thought content normal.        Judgment: Judgment normal.       Patient has been counseled extensively about nutrition and exercise as well as the importance of adherence with medications and regular follow-up. The patient was given clear instructions to go to ER or return to medical center if symptoms don't improve, worsen or new problems develop. The patient verbalized understanding.   Follow-up: Return in about 6 weeks (around 01/16/2019) for DM / then schedule 2nd appointment for physical .   Gildardo Pounds, FNP-BC [facility] [place], [place]   12/05/2018, 5:29 PM

## 2018-12-05 NOTE — Telephone Encounter (Signed)
Met with patient today to review SCAT application,

## 2018-12-06 ENCOUNTER — Telehealth: Payer: Self-pay

## 2018-12-06 NOTE — Telephone Encounter (Signed)
SCAT application faxed to SCAT eligibility  

## 2018-12-21 ENCOUNTER — Telehealth: Payer: Self-pay

## 2018-12-21 NOTE — Telephone Encounter (Signed)
This CM confirmed with Toni Amend Johnson/SCAT that the patient has an assessment scheduled on 01/02/2019

## 2018-12-27 ENCOUNTER — Ambulatory Visit (INDEPENDENT_AMBULATORY_CARE_PROVIDER_SITE_OTHER): Payer: Medicaid Other | Admitting: Orthopedic Surgery

## 2019-01-01 ENCOUNTER — Encounter: Payer: Medicaid Other | Admitting: Nurse Practitioner

## 2019-01-14 ENCOUNTER — Ambulatory Visit (INDEPENDENT_AMBULATORY_CARE_PROVIDER_SITE_OTHER): Payer: Medicaid Other | Admitting: Orthopedic Surgery

## 2019-01-15 ENCOUNTER — Encounter (INDEPENDENT_AMBULATORY_CARE_PROVIDER_SITE_OTHER): Payer: Self-pay | Admitting: Orthopedic Surgery

## 2019-01-15 ENCOUNTER — Ambulatory Visit (INDEPENDENT_AMBULATORY_CARE_PROVIDER_SITE_OTHER): Payer: Medicaid Other | Admitting: Orthopedic Surgery

## 2019-01-15 DIAGNOSIS — Z89512 Acquired absence of left leg below knee: Secondary | ICD-10-CM

## 2019-01-15 NOTE — Progress Notes (Signed)
Office Visit Note   Patient: [name]           Date of Birth: Feb 15, 1976           MRN: [id number] Visit Date: 01/15/2019              Requested by: Claiborne Rigg, NP [address] [place], [place] [address] PCP: Claiborne Rigg, NP  Chief Complaint  Patient presents with  . Left Leg - Routine Post Op    Left BKA      HPI: Patient is a 43 year old gentleman who presents 3 months status post left transtibial amputation.  Surgery was 10/13/2018.  Patient states he occasionally has some phantom pain at night.  He states he still waiting for Medicaid approval so he can get fit for his prosthesis.  Patient states he has been ready for a while.  Assessment & Plan: Visit Diagnoses:  1. Left below-knee amputee [facility])     Plan: Patient was given a 3 extra-large stump shrinker he is to wear this around the clock proper application was shown.  He will follow-up with his prosthetic fitting once Medicaid is approved.  Follow-Up Instructions: Return in about 3 months (around 04/15/2019).   Ortho Exam  Patient is alert, oriented, no adenopathy, well-dressed, normal affect, normal respiratory effort. Examination the residual limb is well consolidated there is no redness no cellulitis no swelling no signs of infection.  There is no keloiding of the scar.  A 3 extra-large stump shrinker was applied.  Imaging: No results found. No images are attached to the encounter.  Labs: Lab Results  Component Value Date   HGBA1C 8.9 (H) 10/08/2018   HGBA1C 11.6 (H) 07/05/2018   HGBA1C 10.6 (H) 04/18/2012   ESRSEDRATE 102 (H) 07/05/2018   CRP 3.7 (H) 07/05/2018   REPTSTATUS 10/13/2018 FINAL 10/08/2018   GRAMSTAIN  09/06/2018    FEW WBC PRESENT, PREDOMINANTLY MONONUCLEAR FEW GRAM POSITIVE COCCI Performed at [facility], [address], [place], [place] [address]    CULT  10/08/2018    NO GROWTH 5 DAYS Performed at [facility], [address] [place], [place]  [address]    [facility] STAPHYLOCOCCUS AUREUS 09/06/2018   LABORGA CITROBACTER KOSERI 09/06/2018     Lab Results  Component Value Date   ALBUMIN 3.3 (L) 07/06/2018   ALBUMIN 3.9 07/30/2012   ALBUMIN 2.3 (L) 04/19/2012   PREALBUMIN 12.8 (L) 07/05/2018    There is no height or weight on file to calculate BMI.  Orders:  No orders of the defined types were placed in this encounter.  No orders of the defined types were placed in this encounter.    Procedures: No procedures performed  Clinical Data: No additional findings.  ROS:  All other systems negative, except as noted in the HPI. Review of Systems  Objective: Vital Signs: There were no vitals taken for this visit.  Specialty Comments:  No specialty comments available.  PMFS History: Patient Active Problem List   Diagnosis Date Noted  . 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 Left below-knee amputee (HCC) 10/17/2018  . Diabetes mellitus (HCC) 07/05/2018  . Elevated MCV 07/05/2018  . Transaminitis 04/18/2012  . Hyponatremia 04/18/2012  . Anemia 04/18/2012  . Alcohol abuse 04/18/2012  . Hypokalemia 04/18/2012  . Pancytopenia 04/18/2012  . Neuropathy 04/18/2012  . DKA 08/16/2010  . PANCREATITIS 08/16/2010   Past Medical History:  Diagnosis Date  .  Anemia   . Bleeding    hx internal bleeding 1995 due to accident  . Blood transfusion   . Diabetes mellitus     Family History  Problem Relation Age of Onset  . Arthritis Mother   . Diabetes Mellitus II Father     Past Surgical History:  Procedure Laterality Date  . AMPUTATION Left 10/10/2018   Procedure: LEFT BELOW KNEE AMPUTATION;  Surgeon: Nadara Mustard, MD;  [facility]: [facility];  Service: Orthopedics;  Laterality: Left;  . EXPLORATORY LAPAROTOMY     Social History   Occupational History  . Not on file  Tobacco Use  . Smoking status: Former Smoker    Last attempt to quit: 04/17/1996    Years since  quitting: 22.7  . Smokeless tobacco: Never Used  Substance and Sexual Activity  . Alcohol use: Not Currently    Comment: occ  . Drug use: Not Currently  . Sexual activity: Yes

## 2019-01-21 ENCOUNTER — Ambulatory Visit: Payer: Self-pay | Attending: Nurse Practitioner | Admitting: Nurse Practitioner

## 2019-01-21 ENCOUNTER — Encounter: Payer: Self-pay | Admitting: Nurse Practitioner

## 2019-01-21 VITALS — BP 128/90 | HR 86 | Temp 98.3°F | Wt 193.2 lb

## 2019-01-21 DIAGNOSIS — E1165 Type 2 diabetes mellitus with hyperglycemia: Secondary | ICD-10-CM

## 2019-01-21 DIAGNOSIS — E118 Type 2 diabetes mellitus with unspecified complications: Secondary | ICD-10-CM

## 2019-01-21 DIAGNOSIS — IMO0002 Reserved for concepts with insufficient information to code with codable children: Secondary | ICD-10-CM

## 2019-01-21 LAB — POCT GLYCOSYLATED HEMOGLOBIN (HGB A1C): Hemoglobin A1C: 8.7 % — AB (ref 4.0–5.6)

## 2019-01-21 LAB — GLUCOSE, POCT (MANUAL RESULT ENTRY): POC Glucose: 233 mg/dl — AB (ref 70–99)

## 2019-01-21 MED FILL — !LEVEMIR FLEXPEN 100UNITS/M: 100U/ML (3) | 20 days supply | Qty: 6 | Fill #1

## 2019-01-21 NOTE — Progress Notes (Signed)
Assessment & Plan:  [name] was seen today for follow-up.  Diagnoses and all orders for this visit:  Diabetes mellitus type 2, uncontrolled, with complications (HCC) -     Glucose (CBG) -     HgB A1c Continue blood sugar control as discussed in office today, low carbohydrate diet, and regular physical exercise as tolerated, 150 minutes per week (30 min each day, 5 days per week, or 50 min 3 days per week). Keep blood sugar logs with fasting goal of 90-130 mg/dl, post prandial (after you eat) less than 180.  For Hypoglycemia: BS <60 and Hyperglycemia BS >400; contact the clinic ASAP. Annual eye exams and foot exams are recommended.   Patient has been counseled on age-appropriate routine health concerns for screening and prevention. These are reviewed and up-to-date. Referrals have been placed accordingly. Immunizations are up-to-date or declined.    Subjective:   Chief Complaint  Patient presents with  . Follow-up    Pt. is here for diabetes follow-up.    HPI [name] 43 y.o. male presents to office today for follow up to DM.    DM TYPE 2 Chronic. Not well controlled mainly due to dietary and exercise non adherence.  Still eating excessive carbs/white food (grits, potatoes). There was some lack of knowledge regarding dietary intake. We had a long discussion regarding carbohydrates and excessive intake of simple and complex sugars. Current medications include: Levemir 30 units daily, Novolog SSI TID. He denies any hypo or hyperglycemic symptoms. A1c down from 8.9 to 8.7 today. Fasting lipid panel pending. Blood pressure is controlled.  Lab Results  Component Value Date   HGBA1C 8.7 (A) 01/21/2019    Review of Systems  Constitutional: Negative for fever, malaise/fatigue and weight loss.  HENT: Negative.  Negative for nosebleeds.   Eyes: Negative.  Negative for blurred vision, double vision and photophobia.  Respiratory: Negative.  Negative for cough and shortness of breath.     Cardiovascular: Negative.  Negative for chest pain, palpitations and leg swelling.  Gastrointestinal: Negative.  Negative for heartburn, nausea and vomiting.  Musculoskeletal: Negative.  Negative for myalgias.  Neurological: Negative.  Negative for dizziness, focal weakness, seizures and headaches.  Psychiatric/Behavioral: Negative.  Negative for suicidal ideas.    Past Medical History:  Diagnosis Date  . Anemia   . Bleeding    hx internal bleeding 1995 due to accident  . Blood transfusion   . Diabetes mellitus     Past Surgical History:  Procedure Laterality Date  . AMPUTATION Left 10/10/2018   Procedure: LEFT BELOW KNEE AMPUTATION;  Surgeon: Newt Minion, MD;  Location: [place];  Service: Orthopedics;  Laterality: Left;  . EXPLORATORY LAPAROTOMY      Family History  Problem Relation Age of Onset  . Arthritis Mother   . Diabetes Mellitus II Father     Social History Reviewed with no changes to be made today.   Outpatient Medications Prior to Visit  Medication Sig Dispense Refill  . acetaminophen (TYLENOL) 325 MG tablet Take 1-2 tablets (325-650 mg total) by mouth every 6 (six) hours as needed for mild pain (pain score 1-3 or temp > 100.5).    Marland Kitchen Blood Glucose Monitoring Suppl (TRUE METRIX METER) w/Device KIT Use as instructed 1 kit 0  . glucose blood (TRUE METRIX BLOOD GLUCOSE TEST) test strip Use as instructed. Monitor blood glucose levels three times per day. 100 each 12  . insulin aspart (NOVOLOG FLEXPEN) 100 UNIT/ML FlexPen Inject 1-10 Units into the  skin 3 (three) times daily with meals. Per sliding scale. 15 mL 1  . Insulin Detemir (LEVEMIR FLEXTOUCH) 100 UNIT/ML Pen Inject 30 Units into the skin daily. 15 mL 1  . losartan (COZAAR) 25 MG tablet Take 0.5 tablets (12.5 mg total) by mouth daily. 45 tablet 3  . TRUEPLUS LANCETS 28G MISC Use as instructed. Monitor blood glucose levels three times per day. 100 each 3  . oxyCODONE (OXY IR/ROXICODONE) 5 MG immediate release  tablet Take 1 tablet (5 mg total) by mouth every 6 (six) hours as needed for severe pain. 20 tablet 0  . venlafaxine XR (EFFEXOR XR) 37.5 MG 24 hr capsule Take 1 capsule (37.5 mg total) by mouth daily with breakfast. 30 capsule 1   No facility-administered medications prior to visit.     No Known Allergies     Objective:    BP 128/90 (BP Location: Left Arm, Patient Position: Sitting, Cuff Size: Normal)   Pulse 86   Temp 98.3 F (36.8 C) (Oral)   Wt 193 lb 3.2 oz (87.6 kg)   SpO2 97%   BMI 26.20 kg/m  Wt Readings from Last 3 Encounters:  01/21/19 193 lb 3.2 oz (87.6 kg)  12/05/18 172 lb 9.6 oz (78.3 kg)  11/29/18 185 lb (83.9 kg)    Physical Exam Vitals signs and nursing note reviewed.  Constitutional:      Appearance: He is well-developed.  HENT:     Head: Normocephalic and atraumatic.  Neck:     Musculoskeletal: Normal range of motion.  Cardiovascular:     Rate and Rhythm: Normal rate and regular rhythm.     Heart sounds: Normal heart sounds. No murmur. No friction rub. No gallop.   Pulmonary:     Effort: Pulmonary effort is normal. No tachypnea or respiratory distress.     Breath sounds: Normal breath sounds. No decreased breath sounds, wheezing, rhonchi or rales.  Chest:     Chest wall: No tenderness.  Abdominal:     General: Bowel sounds are normal.     Palpations: Abdomen is soft.  Musculoskeletal: Normal range of motion.     Left knee: He exhibits deformity.       Legs:  Skin:    General: Skin is warm and dry.  Neurological:     Mental Status: He is alert and oriented to person, place, and time.     Coordination: Coordination normal.  Psychiatric:        Behavior: Behavior normal. Behavior is cooperative.        Thought Content: Thought content normal.        Judgment: Judgment normal.        Patient has been counseled extensively about nutrition and exercise as well as the importance of adherence with medications and regular follow-up. The patient  was given clear instructions to go to ER or return to medical center if symptoms don't improve, worsen or new problems develop. The patient verbalized understanding.   Follow-up: Return in about 3 months (around 04/21/2019).   Gildardo Pounds, FNP-BC [facility] and [place], [place]   01/25/2019, 12:16 AM

## 2019-01-25 ENCOUNTER — Encounter: Payer: Self-pay | Admitting: Nurse Practitioner

## 2019-01-28 ENCOUNTER — Encounter: Payer: Medicaid Other | Admitting: Nurse Practitioner

## 2019-02-18 ENCOUNTER — Ambulatory Visit: Payer: Medicaid Other

## 2019-03-05 ENCOUNTER — Encounter: Payer: Medicaid Other | Admitting: Nurse Practitioner

## 2019-04-15 ENCOUNTER — Ambulatory Visit: Payer: Self-pay | Admitting: Orthopedic Surgery

## 2019-04-18 ENCOUNTER — Ambulatory Visit (INDEPENDENT_AMBULATORY_CARE_PROVIDER_SITE_OTHER): Payer: Self-pay | Admitting: Orthopedic Surgery

## 2019-04-18 ENCOUNTER — Encounter: Payer: Self-pay | Admitting: Orthopedic Surgery

## 2019-04-18 ENCOUNTER — Other Ambulatory Visit: Payer: Self-pay

## 2019-04-18 VITALS — Ht 72.0 in | Wt 193.2 lb

## 2019-04-18 DIAGNOSIS — Z89512 Acquired absence of left leg below knee: Secondary | ICD-10-CM

## 2019-04-18 DIAGNOSIS — Z794 Long term (current) use of insulin: Secondary | ICD-10-CM

## 2019-04-18 DIAGNOSIS — E1142 Type 2 diabetes mellitus with diabetic polyneuropathy: Secondary | ICD-10-CM

## 2019-04-18 NOTE — Progress Notes (Signed)
Office Visit Note   Patient: [name]           Date of Birth: 07-12-1976           MRN: [id number] Visit Date: 04/18/2019              Requested by: Claiborne Rigg, NP [address] [place], [place] [address] PCP: Claiborne Rigg, NP  Chief Complaint  Patient presents with  . Left Leg - Follow-up    10/10/18 left BKA      HPI: The patient is a 43 yo gentleman who is seen for post operative follow up of his left transtibial amputation. He reports no pain or problems over the area. He lost his 3XL shrinker stocking and has been wearing a regular sock over the area. He has not been able to get a prosthesis as yet, as he is still working on his disability and Medicaid.  He reports he has a couple of prescriptions for his prosthesis.   Assessment & Plan: Visit Diagnoses:  1. Left below-knee amputee (HCC)   2. Type 2 diabetes mellitus with diabetic polyneuropathy, with long-term current use of insulin (HCC)     Plan: Provided a 2XL shrinker stocking. He will continue to work on getting a prosthesis once his has Medicaid.   Follow-Up Instructions: Return in about 3 months (around 07/19/2019).   Ortho Exam  Patient is alert, oriented, no adenopathy, well-dressed, normal affect, normal respiratory effort. The left transtibial amputation site is well healed and consolidated. There are no signs of infection or cellulitis. He has full knee extension and good flexion.  Imaging: No results found.   Labs: Lab Results  Component Value Date   HGBA1C 8.7 (A) 01/21/2019   HGBA1C 8.9 (H) 10/08/2018   HGBA1C 11.6 (H) 07/05/2018   ESRSEDRATE 102 (H) 07/05/2018   CRP 3.7 (H) 07/05/2018   REPTSTATUS 10/13/2018 FINAL 10/08/2018   GRAMSTAIN  09/06/2018    FEW WBC PRESENT, PREDOMINANTLY MONONUCLEAR FEW GRAM POSITIVE COCCI Performed at [facility], [address] [place], [place] [address]    CULT  10/08/2018    NO GROWTH 5 DAYS Performed at [facility], [address], [place], [place] [address]    [facility] STAPHYLOCOCCUS AUREUS 09/06/2018   LABORGA CITROBACTER KOSERI 09/06/2018     Lab Results  Component Value Date   ALBUMIN 3.3 (L) 07/06/2018   ALBUMIN 3.9 07/30/2012   ALBUMIN 2.3 (L) 04/19/2012   PREALBUMIN 12.8 (L) 07/05/2018    Body mass index is 26.2 kg/m.  Orders:  No orders of the defined types were placed in this encounter.  No orders of the defined types were placed in this encounter.    Procedures: No procedures performed  Clinical Data: No additional findings.  ROS:  All other systems negative, except as noted in the HPI. Review of Systems  Objective: Vital Signs: Ht 6' (1.829 m)   Wt 193 lb 3.2 oz (87.6 kg)   BMI 26.20 kg/m   Specialty Comments:  No specialty comments available.  PMFS History: Patient Active Problem List   Diagnosis Date Noted  . 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 Left below-knee amputee (HCC) 10/17/2018  . Diabetes mellitus (HCC) 07/05/2018  . Elevated MCV 07/05/2018  . Transaminitis 04/18/2012  . Hyponatremia 04/18/2012  . Anemia 04/18/2012  . Alcohol abuse 04/18/2012  . Hypokalemia 04/18/2012  . Pancytopenia 04/18/2012  . Neuropathy 04/18/2012  .  DKA 08/16/2010  . PANCREATITIS 08/16/2010   Past Medical History:  Diagnosis Date  . Anemia   . Bleeding    hx internal bleeding 1995 due to accident  . Blood transfusion   . Diabetes mellitus     Family History  Problem Relation Age of Onset  . Arthritis Mother   . Diabetes Mellitus II Father     Past Surgical History:  Procedure Laterality Date  . AMPUTATION Left 10/10/2018   Procedure: LEFT BELOW KNEE AMPUTATION;  Surgeon: Nadara Mustarduda, Marcus V, MD;  [facility];  Service: Orthopedics;  Laterality: Left;  . EXPLORATORY LAPAROTOMY     Social History   Occupational History  . Not on file  Tobacco Use  . Smoking status: Former Smoker    Last attempt to quit:  04/17/1996    Years since quitting: 23.0  . Smokeless tobacco: Never Used  Substance and Sexual Activity  . Alcohol use: Not Currently    Comment: occ  . Drug use: Not Currently  . Sexual activity: Yes

## 2019-04-19 ENCOUNTER — Ambulatory Visit: Payer: Self-pay | Attending: Nurse Practitioner | Admitting: Nurse Practitioner

## 2019-04-19 ENCOUNTER — Encounter: Payer: Self-pay | Admitting: Nurse Practitioner

## 2019-04-19 VITALS — BP 139/97 | HR 87 | Temp 98.1°F | Ht 72.0 in | Wt 196.0 lb

## 2019-04-19 DIAGNOSIS — D649 Anemia, unspecified: Secondary | ICD-10-CM

## 2019-04-19 DIAGNOSIS — E118 Type 2 diabetes mellitus with unspecified complications: Secondary | ICD-10-CM

## 2019-04-19 DIAGNOSIS — I10 Essential (primary) hypertension: Secondary | ICD-10-CM

## 2019-04-19 DIAGNOSIS — F320 Major depressive disorder, single episode, mild: Secondary | ICD-10-CM

## 2019-04-19 DIAGNOSIS — E1165 Type 2 diabetes mellitus with hyperglycemia: Secondary | ICD-10-CM

## 2019-04-19 DIAGNOSIS — IMO0002 Reserved for concepts with insufficient information to code with codable children: Secondary | ICD-10-CM

## 2019-04-19 LAB — POCT GLYCOSYLATED HEMOGLOBIN (HGB A1C): Hemoglobin A1C: 9.4 % — AB (ref 4.0–5.6)

## 2019-04-19 LAB — GLUCOSE, POCT (MANUAL RESULT ENTRY): POC Glucose: 193 mg/dl — AB (ref 70–99)

## 2019-04-19 MED ORDER — INSULIN ASPART 100 UNIT/ML FLEXPEN: 10.0000 [IU] | PEN_INJECTOR | Freq: Three times a day (TID) | SUBCUTANEOUS | 3 refills | Status: DC

## 2019-04-19 MED ORDER — TRUE METRIX METER W/DEVICE KIT: PACK | 0 refills | Status: DC

## 2019-04-19 MED ORDER — LOSARTAN POTASSIUM 25 MG PO TABS: 12.5000 mg | ORAL_TABLET | Freq: Every day | ORAL | 1 refills | Status: DC

## 2019-04-19 MED ORDER — GLUCOSE BLOOD VI STRP: ORAL_STRIP | 12 refills | Status: DC

## 2019-04-19 MED FILL — !LEVEMIR FLEXPEN 100UNITS/M: 100U/ML (3) | 20 days supply | Qty: 6 | Fill #2

## 2019-04-19 MED FILL — !NOVOLOG FLEXPEN SYRINGE 1: 100/ML | 20 days supply | Qty: 6 | Fill #1

## 2019-04-19 NOTE — Progress Notes (Signed)
Assessment & Plan:  [name] was seen today for follow-up.  Diagnoses and all orders for this visit:  Diabetes mellitus type 2, uncontrolled, with complications (HCC) -     Glucose (CBG) -     HgB A1c -     insulin aspart (NOVOLOG FLEXPEN) 100 UNIT/ML FlexPen; Inject 10 Units into the skin 3 (three) times daily with meals for 30 days. Per sliding scale. -     glucose blood (TRUE METRIX BLOOD GLUCOSE TEST) test strip; Use as instructed. Monitor blood glucose levels three times per day. -     Blood Glucose Monitoring Suppl (TRUE METRIX METER) w/Device KIT; Use as instructed -     Lipid panel Diabetes is poorly controlled. Advised patient to keep a fasting blood sugar log fast, 2 hours post lunch and bedtime which will be reviewed at the next office visit.  Essential hypertension -     losartan (COZAAR) 25 MG tablet; Take 0.5 tablets (12.5 mg total) by mouth daily. -     Lipid panel Continue all antihypertensives as prescribed.  Remember to bring in your blood pressure log with you for your follow up appointment.  DASH/Mediterranean Diets are healthier choices for HTN.    Anemia -     CBC    Patient has been counseled on age-appropriate routine health concerns for screening and prevention. These are reviewed and up-to-date. Referrals have been placed accordingly. Immunizations are up-to-date or declined.    Subjective:   Chief Complaint  Patient presents with  . Follow-up    Pt. is here for DM follow up.    HPI [name] 43 y.o. male presents to office today for DM and HTN follow up.  He was seen by Dr. Sharol Given 04-19-2019 for post op follow up of his left transtibial amputation. Denies any current complications.    Essential Hypertension He does not monitor his blood pressure at home. Never started the losartan 12.'5mg'$  that I prescribed for him at his last office visit. States he did not feel he needed medication. I have instructed him regarding the ADA goals for BP. Will refill  losartan today. Denies chest pain, shortness of breath, palpitations, lightheadedness, dizziness, headaches or BLE edema.  BP Readings from Last 3 Encounters:  04/19/19 (!) 139/97  01/21/19 128/90  12/05/18 127/88    Depression He did not pick up the effexor that was prescribed for him. States his mood has improved but still stressed about finances. PHQ9 has improved however still positive. He currently denies any thoughts of self harm. He declines Education officer, museum consult as well today.  Depression screen [facility] 2/9 04/19/2019 01/21/2019 12/05/2018 10/24/2018  Decreased Interest '2 1 1 1  '$ Down, Depressed, Hopeless '2 2 3 3  '$ PHQ - 2 Score '4 3 4 4  '$ Altered sleeping '3 2 3 3  '$ Tired, decreased energy '1 2 3 3  '$ Change in appetite 0 '1 1 3  '$ Feeling bad or failure about yourself  0 2 2 -  Trouble concentrating 2 2 - 0  Moving slowly or fidgety/restless '1 1 1 1  '$ Suicidal thoughts 0 0 0 0  PHQ-9 Score '11 13 14 14    '$ DM TYPE 2 A1c has worsened. He has not been diet compliant and just recently picked his insulin up from the pharmacy. Was using his girlfriend's meter, strips and lancets. States he thought the meter I ordered for him at his last office visit would be an OOP cost. I have instructed him there  would be no cost for the meter. Will re order today. States blood glucose levels consistently in the 200 mainly due to diet noncompliance. Will switch novolog to 10 units TID and he was instructed to continue levemir 30 units. Will bring meter to next office visit for numbers check. He denies any hypoglycemic symptoms. Overdue for eye exam. Patient has been advised to apply for financial assistance and schedule to see our financial counselor.  Lab Results  Component Value Date   HGBA1C 9.4 (A) 04/19/2019   Review of Systems  Constitutional: Negative for fever, malaise/fatigue and weight loss.  HENT: Negative.  Negative for nosebleeds.   Eyes: Negative.  Negative for blurred vision, double vision and  photophobia.  Respiratory: Negative.  Negative for cough and shortness of breath.   Cardiovascular: Negative.  Negative for chest pain, palpitations and leg swelling.  Gastrointestinal: Negative.  Negative for heartburn, nausea and vomiting.  Musculoskeletal: Negative for myalgias and neck pain.       Left BKA  Neurological: Negative.  Negative for dizziness, focal weakness, seizures and headaches.  Psychiatric/Behavioral: Positive for depression. Negative for suicidal ideas.    Past Medical History:  Diagnosis Date  . Anemia   . Bleeding    hx internal bleeding 1995 due to accident  . Blood transfusion   . Diabetes mellitus     Past Surgical History:  Procedure Laterality Date  . AMPUTATION Left 10/10/2018   Procedure: LEFT BELOW KNEE AMPUTATION;  Surgeon: Newt Minion, MD;  Location: [place];  Service: Orthopedics;  Laterality: Left;  . EXPLORATORY LAPAROTOMY      Family History  Problem Relation Age of Onset  . Arthritis Mother   . Diabetes Mellitus II Father     Social History Reviewed with no changes to be made today.   Outpatient Medications Prior to Visit  Medication Sig Dispense Refill  . acetaminophen (TYLENOL) 325 MG tablet Take 1-2 tablets (325-650 mg total) by mouth every 6 (six) hours as needed for mild pain (pain score 1-3 or temp > 100.5).    . Insulin Detemir (LEVEMIR FLEXTOUCH) 100 UNIT/ML Pen Inject 30 Units into the skin daily. 15 mL 1  . TRUEPLUS LANCETS 28G MISC Use as instructed. Monitor blood glucose levels three times per day. 100 each 3  . Blood Glucose Monitoring Suppl (TRUE METRIX METER) w/Device KIT Use as instructed 1 kit 0  . glucose blood (TRUE METRIX BLOOD GLUCOSE TEST) test strip Use as instructed. Monitor blood glucose levels three times per day. 100 each 12  . insulin aspart (NOVOLOG FLEXPEN) 100 UNIT/ML FlexPen Inject 1-10 Units into the skin 3 (three) times daily with meals. Per sliding scale. 15 mL 1  . venlafaxine XR (EFFEXOR XR) 37.5  MG 24 hr capsule Take 1 capsule (37.5 mg total) by mouth daily with breakfast. 30 capsule 1  . losartan (COZAAR) 25 MG tablet Take 0.5 tablets (12.5 mg total) by mouth daily. 45 tablet 3   No facility-administered medications prior to visit.     No Known Allergies     Objective:    BP (!) 139/97 (BP Location: Right Arm, Patient Position: Sitting, Cuff Size: Normal)   Pulse 87   Temp 98.1 F (36.7 C) (Oral)   Ht 6' (1.829 m)   Wt 196 lb (88.9 kg)   SpO2 100%   BMI 26.58 kg/m  Wt Readings from Last 3 Encounters:  04/19/19 196 lb (88.9 kg)  04/18/19 193 lb 3.2 oz (87.6 kg)  01/21/19 193 lb 3.2 oz (87.6 kg)    Physical Exam Vitals signs and nursing note reviewed.  Constitutional:      Appearance: He is well-developed.  HENT:     Head: Normocephalic and atraumatic.  Neck:     Musculoskeletal: Normal range of motion.  Cardiovascular:     Rate and Rhythm: Normal rate and regular rhythm.     Heart sounds: Normal heart sounds. No murmur. No friction rub. No gallop.   Pulmonary:     Effort: Pulmonary effort is normal. No tachypnea or respiratory distress.     Breath sounds: Normal breath sounds. No decreased breath sounds, wheezing, rhonchi or rales.  Chest:     Chest wall: No tenderness.  Abdominal:     General: Bowel sounds are normal.     Palpations: Abdomen is soft.  Musculoskeletal: Normal range of motion.     Left lower leg: He exhibits deformity (left bka).  Skin:    General: Skin is warm and dry.  Neurological:     Mental Status: He is alert and oriented to person, place, and time.     Coordination: Coordination normal.  Psychiatric:        Behavior: Behavior normal. Behavior is cooperative.        Thought Content: Thought content normal.        Judgment: Judgment normal.        Patient has been counseled extensively about nutrition and exercise as well as the importance of adherence with medications and regular follow-up. The patient was given clear  instructions to go to ER or return to medical center if symptoms don't improve, worsen or new problems develop. The patient verbalized understanding.   Follow-up: Return in about 3 months (around 07/20/2019) for Physical and CMP.   Gildardo Pounds, FNP-BC [facility] [place], [place]   04/19/2019, 8:30 PM

## 2019-04-20 LAB — CBC
Hematocrit: 41.3 % (ref 37.5–51.0)
Hemoglobin: 14.1 g/dL (ref 13.0–17.7)
MCH: 33.9 pg — ABNORMAL HIGH (ref 26.6–33.0)
MCHC: 34.1 g/dL (ref 31.5–35.7)
MCV: 99 fL — ABNORMAL HIGH (ref 79–97)
Platelets: 212 10*3/uL (ref 150–450)
RBC: 4.16 x10E6/uL (ref 4.14–5.80)
RDW: 13 % (ref 11.6–15.4)
WBC: 2.9 10*3/uL — ABNORMAL LOW (ref 3.4–10.8)

## 2019-04-20 LAB — LIPID PANEL
Chol/HDL Ratio: 2.4 ratio (ref 0.0–5.0)
Cholesterol, Total: 201 mg/dL — ABNORMAL HIGH (ref 100–199)
HDL: 85 mg/dL (ref >39)
LDL Calculated: 101 mg/dL — ABNORMAL HIGH (ref 0–99)
Triglycerides: 77 mg/dL (ref 0–149)
VLDL Cholesterol Cal: 15 mg/dL (ref 5–40)

## 2019-04-22 ENCOUNTER — Other Ambulatory Visit: Payer: Self-pay | Admitting: Nurse Practitioner

## 2019-04-22 MED ORDER — ATORVASTATIN CALCIUM 20 MG PO TABS: 20.0000 mg | ORAL_TABLET | Freq: Every day | ORAL | 3 refills | Status: DC

## 2019-04-25 MED FILL — LOSARTAN POTASSIUM 25 MG TA: 25 | 30 days supply | Qty: 15 | Fill #0

## 2019-04-25 MED FILL — ?ATORVASTATIN 20 MG TABLET: 20 | 30 days supply | Qty: 30 | Fill #0

## 2019-04-25 MED FILL — !TRUE METRIX BLOOD GLUCOSE: 1 days supply | Qty: 1 | Fill #0

## 2019-04-25 MED FILL — TRUEplus LANCETS 28G MISC: 30 days supply | Qty: 100 | Fill #0

## 2019-04-25 MED FILL — TRUE METRIX TEST STRIP: 30 days supply | Qty: 100 | Fill #0

## 2019-04-28 ENCOUNTER — Telehealth: Payer: Self-pay | Admitting: *Deleted

## 2019-04-28 NOTE — Telephone Encounter (Signed)
LM for patient to call [phone] for information from a recent visit to [facility] on 04/18/2019.

## 2019-07-22 ENCOUNTER — Ambulatory Visit: Payer: Medicaid Other | Admitting: Nurse Practitioner

## 2019-07-22 ENCOUNTER — Ambulatory Visit (INDEPENDENT_AMBULATORY_CARE_PROVIDER_SITE_OTHER): Payer: Self-pay | Admitting: Orthopedic Surgery

## 2019-07-22 ENCOUNTER — Encounter: Payer: Self-pay | Admitting: Orthopedic Surgery

## 2019-07-22 VITALS — Ht 72.0 in | Wt 196.0 lb

## 2019-07-22 DIAGNOSIS — Z89512 Acquired absence of left leg below knee: Secondary | ICD-10-CM

## 2019-07-22 NOTE — Progress Notes (Signed)
Office Visit Note   Patient: [name]           Date of Birth: May 20, 1976           MRN: [id number] Visit Date: 07/22/2019              Requested by: Claiborne RiggFleming, Dylan W, NP [address] [place],  [place] [id number] PCP: Claiborne RiggFleming, Dylan W, NP  Chief Complaint  Patient presents with  . Left Leg - Follow-up    10/10/18 left BKA       HPI: Patient is a 43 year old gentleman who presents 9 months status post left transtibial amputation.  He is currently ambulating with crutches he is wearing a stump shrinker he states that he is awaiting Medicaid approval for prosthesis.  Patient states he did have a blister that has resolved.  Assessment & Plan: Visit Diagnoses:  1. Left below-knee amputee [facility](HCC)     Plan: Patient will continue the stump shrinker continue with crutches he will follow-up with [employer] for prosthetic fitting once this is approved by Medicaid.  We will follow-up in the office after he is fit with his prosthesis.  Patient will need gait training therapy with [facility]  Follow-Up Instructions: Return if symptoms worsen or fail to improve.   Ortho Exam  Patient is alert, oriented, no adenopathy, well-dressed, normal affect, normal respiratory effort. Examination patient has full extension and flexion of the left knee there are no open ulcers no drainage no cellulitis his limb is well consolidated no signs of infection.  Imaging: No results found. No images are attached to the encounter.  Labs: Lab Results  Component Value Date   HGBA1C 9.4 (A) 04/19/2019   HGBA1C 8.7 (A) 01/21/2019   HGBA1C 8.9 (H) 10/08/2018   ESRSEDRATE 102 (H) 07/05/2018   CRP 3.7 (H) 07/05/2018   REPTSTATUS 10/13/2018 FINAL 10/08/2018   GRAMSTAIN  09/06/2018    FEW WBC PRESENT, PREDOMINANTLY MONONUCLEAR FEW GRAM POSITIVE COCCI Performed at [facility], [address] [place], [place] [address]    CULT  10/08/2018    NO GROWTH 5 DAYS Performed at [facility], [address] [place], [place] [address]    [facility] 09/06/2018   LABORGA CITROBACTER KOSERI 09/06/2018     Lab Results  Component Value Date   ALBUMIN 3.3 (L) 07/06/2018   ALBUMIN 3.9 07/30/2012   ALBUMIN 2.3 (L) 04/19/2012   PREALBUMIN 12.8 (L) 07/05/2018    Lab Results  Component Value Date   MG 1.5 04/19/2012   MG 1.2 (L) 04/18/2012   MG 2.0 04/10/2011   No results found for: VD25OH  Lab Results  Component Value Date   PREALBUMIN 12.8 (L) 07/05/2018   CBC EXTENDED Latest Ref Rng & Units 04/19/2019 10/12/2018 10/11/2018  WBC 3.4 - 10.8 x10E3/uL 2.9(L) 4.4 3.6(L)  RBC 4.14 - 5.80 x10E6/uL 4.16 3.15(L) 3.42(L)  HGB 13.0 - 17.7 g/dL 21.314.1 10.3(L) 11.0(L)  HCT 37.5 - 51.0 % 41.3 31.3(L) 34.3(L)  PLT 150 - 450 x10E3/uL 212 333 303  NEUTROABS 1.7 - 7.7 K/uL - - -  LYMPHSABS 0.7 - 4.0 K/uL - - -     Body mass index is 26.58 kg/m.  Orders:  No orders of the defined types were placed in this encounter.  No orders of the defined types were placed in this encounter.    Procedures: No procedures performed  Clinical Data: No additional findings.  ROS:  All other systems negative, except  as noted in the HPI. Review of Systems  Objective: Vital Signs: Ht 6' (1.829 m)   Wt 196 lb (88.9 kg)   BMI 26.58 kg/m   Specialty Comments:  No specialty comments available.  PMFS History: Patient Active Problem List   Diagnosis Date Noted  . Current mild episode of major depressive disorder without prior episode ([place]) 12/05/2018  . Left below-knee amputee ([place]) 10/17/2018  . Diabetes mellitus ([place]) 07/05/2018  . Elevated MCV 07/05/2018  . Transaminitis 04/18/2012  . Hyponatremia 04/18/2012  . Anemia 04/18/2012  . Alcohol abuse 04/18/2012  . Hypokalemia 04/18/2012  . Pancytopenia 04/18/2012  . Neuropathy 04/18/2012  . DKA 08/16/2010  . PANCREATITIS 08/16/2010   Past Medical History:  Diagnosis Date  . Anemia   . Bleeding    hx internal bleeding  1995 due to accident  . Blood transfusion   . Diabetes mellitus     Family History  Problem Relation Age of Onset  . Arthritis Mother   . Diabetes Mellitus II Father     Past Surgical History:  Procedure Laterality Date  . AMPUTATION Left 10/10/2018   Procedure: LEFT BELOW KNEE AMPUTATION;  Surgeon: Newt Minion, MD;  Location: [place];  Service: Orthopedics;  Laterality: Left;  . EXPLORATORY LAPAROTOMY     Social History   Occupational History  . Not on file  Tobacco Use  . Smoking status: Former Smoker    Quit date: 04/17/1996    Years since quitting: 23.2  . Smokeless tobacco: Never Used  Substance and Sexual Activity  . Alcohol use: Yes    Comment: occ  . Drug use: Not Currently  . Sexual activity: Yes

## 2019-07-23 ENCOUNTER — Encounter: Payer: Self-pay | Admitting: Nurse Practitioner

## 2019-07-23 ENCOUNTER — Ambulatory Visit: Payer: Medicaid Other | Admitting: Pharmacist

## 2019-07-23 ENCOUNTER — Ambulatory Visit: Payer: Self-pay | Attending: Nurse Practitioner | Admitting: Nurse Practitioner

## 2019-07-23 ENCOUNTER — Other Ambulatory Visit: Payer: Self-pay

## 2019-07-23 VITALS — BP 148/110 | HR 74 | Temp 98.6°F | Ht 72.0 in | Wt 201.0 lb

## 2019-07-23 DIAGNOSIS — Z23 Encounter for immunization: Secondary | ICD-10-CM

## 2019-07-23 DIAGNOSIS — I10 Essential (primary) hypertension: Secondary | ICD-10-CM

## 2019-07-23 DIAGNOSIS — E1165 Type 2 diabetes mellitus with hyperglycemia: Secondary | ICD-10-CM

## 2019-07-23 DIAGNOSIS — E782 Mixed hyperlipidemia: Secondary | ICD-10-CM

## 2019-07-23 DIAGNOSIS — F320 Major depressive disorder, single episode, mild: Secondary | ICD-10-CM

## 2019-07-23 DIAGNOSIS — E118 Type 2 diabetes mellitus with unspecified complications: Secondary | ICD-10-CM

## 2019-07-23 DIAGNOSIS — Z89512 Acquired absence of left leg below knee: Secondary | ICD-10-CM

## 2019-07-23 DIAGNOSIS — IMO0002 Reserved for concepts with insufficient information to code with codable children: Secondary | ICD-10-CM

## 2019-07-23 LAB — POCT GLYCOSYLATED HEMOGLOBIN (HGB A1C): Hemoglobin A1C: 9.2 % — AB (ref 4.0–5.6)

## 2019-07-23 LAB — GLUCOSE, POCT (MANUAL RESULT ENTRY): POC Glucose: 124 mg/dl — AB (ref 70–99)

## 2019-07-23 MED ORDER — NOVOLOG FLEXPEN 100 UNIT/ML ~~LOC~~ SOPN: 10.0000 [IU] | PEN_INJECTOR | Freq: Three times a day (TID) | SUBCUTANEOUS | 3 refills | Status: DC

## 2019-07-23 MED ORDER — LOSARTAN POTASSIUM 25 MG PO TABS: 25.0000 mg | ORAL_TABLET | Freq: Every day | ORAL | 0 refills | Status: DC

## 2019-07-23 MED ORDER — VENLAFAXINE HCL ER 37.5 MG PO CP24: 37.5000 mg | ORAL_CAPSULE | Freq: Every day | ORAL | 1 refills | Status: DC

## 2019-07-23 MED ORDER — BD PEN NEEDLE MINI U/F 31G X 5 MM MISC: 6 refills | Status: DC

## 2019-07-23 MED ORDER — TRUE METRIX BLOOD GLUCOSE TEST VI STRP: ORAL_STRIP | 12 refills | Status: DC

## 2019-07-23 MED ORDER — LEVEMIR FLEXTOUCH 100 UNIT/ML ~~LOC~~ SOPN: 30.0000 [IU] | PEN_INJECTOR | Freq: Every day | SUBCUTANEOUS | 1 refills | Status: DC

## 2019-07-23 MED ORDER — VICTOZA 18 MG/3ML ~~LOC~~ SOPN: PEN_INJECTOR | SUBCUTANEOUS | 6 refills | Status: DC

## 2019-07-23 MED ORDER — ATORVASTATIN CALCIUM 20 MG PO TABS: 20.0000 mg | ORAL_TABLET | Freq: Every day | ORAL | 3 refills | Status: DC

## 2019-07-23 MED FILL — !VICTOZA 18MG/3ML INJECT: 18 | 17 days supply | Qty: 3 | Fill #0

## 2019-07-23 MED FILL — VENLAFAXINE HCL ER 37.5 MG: 37.5 | 30 days supply | Qty: 30 | Fill #0

## 2019-07-23 MED FILL — ATORVASTATIN 20 MG TABLET: 20 | 30 days supply | Qty: 30 | Fill #0

## 2019-07-23 MED FILL — TRUE METRIX TEST STRIP: 30 days supply | Qty: 100 | Fill #1

## 2019-07-23 MED FILL — TRUEPLUS 5-BEVEL PEN NEEDLE: 31G X 5 MM | 25 days supply | Qty: 100 | Fill #0

## 2019-07-23 MED FILL — !LEVEMIR FLEXPEN 100UNITS/M: 100U/ML (3) | 20 days supply | Qty: 6 | Fill #3

## 2019-07-23 MED FILL — LOSARTAN POTASSIUM 25 MG TA: 25 | 30 days supply | Qty: 30 | Fill #0

## 2019-07-23 NOTE — Progress Notes (Signed)
Assessment & Plan:  [name] was seen today for follow-up.  Diagnoses and all orders for this visit:  Diabetes mellitus type 2, uncontrolled, with complications (HCC) -     Glucose (CBG) -     HgB A1c -     CMP14+EGFR -     CBC with Differential -     TSH -     glucose blood (TRUE METRIX BLOOD GLUCOSE TEST) test strip; Use as instructed. Monitor blood glucose levels three times per day. -     insulin aspart (NOVOLOG FLEXPEN) 100 UNIT/ML FlexPen; Inject 10 Units into the skin 3 (three) times daily with meals. Per sliding scale. -     Insulin Detemir (LEVEMIR FLEXTOUCH) 100 UNIT/ML Pen; Inject 30 Units into the skin daily. -     liraglutide (VICTOZA) 18 MG/3ML SOPN; SubQ:  Inject 0.6 mg once daily into the skin. Week 2: increase to 1.2 mg once daily; week 3: increase to 1.8 mg once daily -     Insulin Pen Needle (B-D UF III MINI PEN NEEDLES) 31G X 5 MM MISC; Use as instructed. Inject into the skin 4 times per day -     Ambulatory referral to Ophthalmology Continue blood sugar control as discussed in office today, low carbohydrate diet, and regular physical exercise as tolerated, 150 minutes per week (30 min each day, 5 days per week, or 50 min 3 days per week). Keep blood sugar logs with fasting goal of 90-130 mg/dl, post prandial (after you eat) less than 180.  For Hypoglycemia: BS <60 and Hyperglycemia BS >400; contact the clinic ASAP. Annual eye exams and foot exams are recommended.  Left below-knee amputee [facility]) Continue follow up with Dr. Sharol Wall as instructed.   Essential hypertension -     losartan (COZAAR) 25 MG tablet; Take 1 tablet (25 mg total) by mouth daily. Continue all antihypertensives as prescribed.  Remember to bring in your blood pressure log with you for your follow up appointment.  DASH/Mediterranean Diets are healthier choices for HTN.    Current mild episode of major depressive disorder without prior episode (HCC) -     TSH -     venlafaxine XR (EFFEXOR XR) 37.5  MG 24 hr capsule; Take 1 capsule (37.5 mg total) by mouth daily with breakfast.  Mixed hyperlipidemia -     atorvastatin (LIPITOR) 20 MG tablet; Take 1 tablet (20 mg total) by mouth daily. INSTRUCTIONS: Work on a low fat, heart healthy diet and participate in regular aerobic exercise program by working out at least 150 minutes per week; 5 days a week-30 minutes per day. Avoid red meat, fried foods. junk foods, sodas, sugary drinks, unhealthy snacking, alcohol and smoking.  Drink at least 48oz of water per day and monitor your carbohydrate intake daily.      Patient has been counseled on age-appropriate routine health concerns for screening and prevention. These are reviewed and up-to-date. Referrals have been placed accordingly. Immunizations are up-to-date or declined.    Subjective:   Chief Complaint  Patient presents with  . Follow-up    Pt. is here to follow up on diabetes.    HPI [name] 43 y.o. male presents to office today for follow up.   has a past medical history of Anemia, Bleeding, Blood transfusion, and Diabetes mellitus.   DM TYPE 2 A1c is poorly controlled. This has been a re occurring issue for [name]. Will need to start [name] today. He is currently on: Levemir 30  units daily and novoog 10 units TID. Taking statin and ARB. He is not diet or exercise compliant. He is overdue for eye exam. Seeing Dr. Sharol Wall for L BKA. I have encouraged him today to regain control over his diabetes and the risks of poorly controlled diabetes including kidney failure, poor wound healing, MI, stroke, blindness and death.  Lab Results  Component Value Date   HGBA1C 9.2 (A) 07/23/2019  LDL not at goal although he endorses medication compliance atorvastatin 36m. He denies any statin intolerance or myalgias.  Lab Results  Component Value Date   LDLCALC 101 (H) 04/19/2019   Essential Hypertension Blood pressure is not well controlled today. Taking losartan 25 mg daily. WIll have patient  increase losartan from 2107mto 5081mGOAL BP 130/80 or less.  Denies chest pain, shortness of breath, palpitations, lightheadedness, dizziness, headaches or BLE edema.   BP Readings from Last 3 Encounters:  07/23/19 (!) 148/110  04/19/19 (!) 139/97  01/21/19 128/90    Depression He has stopped taking effexor but today states "I probably need to get back on it". Denies any suicidal ideation or thoughts of self harm. Will restart SSRI.  [facility] 07/23/2019 04/19/2019 01/21/2019 12/05/2018 10/24/2018  Decreased Interest _0 Down, Depressed, Hopeless _1 PHQ - 2 Score _2 Altered sleeping _3 Tired, decreased energy _4 Change in appetite 2 0 _5 Feeling bad or failure about yourself  1 0 2 2 -  Trouble concentrating _6 - 0  Moving slowly or fidgety/restless _7 Suicidal thoughts 0 0 0 0 0  PHQ-9 Score _8 Review of Systems  Constitutional: Negative for fever, malaise/fatigue and weight loss.  HENT: Negative.  Negative for nosebleeds.   Eyes: Negative.  Negative for blurred vision, double vision and photophobia.  Respiratory: Negative.  Negative for cough and shortness of breath.   Cardiovascular: Negative.  Negative for chest pain, palpitations and leg swelling.  Gastrointestinal: Negative.  Negative for heartburn, nausea and vomiting.  Musculoskeletal: Negative for myalgias.  Neurological: Negative.  Negative for dizziness, focal weakness, seizures and headaches.  Psychiatric/Behavioral: Positive for depression. Negative for suicidal ideas.    Past Medical History:  Diagnosis Date  . Anemia   . Bleeding    hx internal bleeding 1995 due to accident  . Blood transfusion   . Diabetes mellitus     Past Surgical History:  Procedure Laterality Date  . AMPUTATION Left 10/10/2018   Procedure: LEFT BELOW KNEE AMPUTATION;  Surgeon: DudNewt MinionD;  [facility]  Laterality: Left;   . EXPLORATORY LAPAROTOMY      Family History  Problem Relation Age of Onset  . Arthritis Mother   . Diabetes Mellitus II Father     Social History Reviewed with no changes to be made today.   Outpatient Medications Prior to Visit  Medication Sig Dispense Refill  . acetaminophen (TYLENOL) 325 MG tablet Take 1-2 tablets (325-650 mg total) by mouth every 6 (six) hours as needed for mild pain (pain score 1-3 or temp > 100.5).    . BMarland Kitchenood Glucose Monitoring Suppl (TRUE METRIX METER) w/Device KIT Use as instructed 1 kit 0  . TRUEPLUS LANCETS 28G MISC Use as instructed. Monitor blood glucose levels three times per day.  100 each 3  . atorvastatin (LIPITOR) 20 MG tablet Take 1 tablet (20 mg total) by mouth daily. 90 tablet 3  . glucose blood (TRUE METRIX BLOOD GLUCOSE TEST) test strip Use as instructed. Monitor blood glucose levels three times per day. 100 each 12  . Insulin Detemir (LEVEMIR FLEXTOUCH) 100 UNIT/ML Pen Inject 30 Units into the skin daily. 15 mL 1  . insulin aspart (NOVOLOG FLEXPEN) 100 UNIT/ML FlexPen Inject 10 Units into the skin 3 (three) times daily with meals for 30 days. Per sliding scale. 15 mL 3  . losartan (COZAAR) 25 MG tablet Take 0.5 tablets (12.5 mg total) by mouth daily. 45 tablet 1  . venlafaxine XR (EFFEXOR XR) 37.5 MG 24 hr capsule Take 1 capsule (37.5 mg total) by mouth daily with breakfast. 30 capsule 1   No facility-administered medications prior to visit.     No Known Allergies     Objective:    BP (!) 148/110 (BP Location: Right Arm, Patient Position: Sitting, Cuff Size: Normal)   Pulse 74   Temp 98.6 F (37 C) (Oral)   Ht 6' (1.829 m)   Wt 201 lb (91.2 kg)   SpO2 100%   BMI 27.26 kg/m  Wt Readings from Last 3 Encounters:  07/23/19 201 lb (91.2 kg)  07/22/19 196 lb (88.9 kg)  04/19/19 196 lb (88.9 kg)    Physical Exam Vitals signs and nursing note reviewed.  Constitutional:      Appearance: He is well-developed.  HENT:     Head:  Normocephalic and atraumatic.  Neck:     Musculoskeletal: Normal range of motion.  Cardiovascular:     Rate and Rhythm: Normal rate and regular rhythm.     Heart sounds: Normal heart sounds. No murmur. No friction rub. No gallop.   Pulmonary:     Effort: Pulmonary effort is normal. No tachypnea or respiratory distress.     Breath sounds: Normal breath sounds. No decreased breath sounds, wheezing, rhonchi or rales.  Chest:     Chest wall: No tenderness.  Abdominal:     General: Bowel sounds are normal.     Palpations: Abdomen is soft.  Musculoskeletal: Normal range of motion.  Skin:    General: Skin is warm and dry.  Neurological:     Mental Status: He is alert and oriented to person, place, and time.     Coordination: Coordination normal.  Psychiatric:        Mood and Affect: Mood is depressed.        Behavior: Behavior normal. Behavior is cooperative.        Thought Content: Thought content normal.        Cognition and Memory: Cognition and memory normal.        Judgment: Judgment normal.        Patient has been counseled extensively about nutrition and exercise as well as the importance of adherence with medications and regular follow-up. The patient was Wall clear instructions to go to ER or return to medical center if symptoms don't improve, worsen or new problems develop. The patient verbalized understanding.   Follow-up: Return for 3 weeks luke BP and meter check and 3 months me.   Gildardo Pounds, FNP-BC [facility], [place]   07/28/2019, 12:18 AM

## 2019-07-23 NOTE — Progress Notes (Signed)
Patient presents for vaccination against influenza. Consent given. Counseling provided. No contraindications exist. Vaccine administered without incident.

## 2019-07-24 LAB — CBC WITH DIFFERENTIAL/PLATELET
Basophils Absolute: 0 10*3/uL (ref 0.0–0.2)
Basos: 1 %
EOS (ABSOLUTE): 0.1 10*3/uL (ref 0.0–0.4)
Eos: 2 %
Hematocrit: 38.3 % (ref 37.5–51.0)
Hemoglobin: 13.8 g/dL (ref 13.0–17.7)
Immature Grans (Abs): 0 10*3/uL (ref 0.0–0.1)
Immature Granulocytes: 0 %
Lymphocytes Absolute: 1.1 10*3/uL (ref 0.7–3.1)
Lymphs: 34 %
MCH: 34.9 pg — ABNORMAL HIGH (ref 26.6–33.0)
MCHC: 36 g/dL — ABNORMAL HIGH (ref 31.5–35.7)
MCV: 97 fL (ref 79–97)
Monocytes Absolute: 0.5 10*3/uL (ref 0.1–0.9)
Monocytes: 16 %
Neutrophils Absolute: 1.4 10*3/uL (ref 1.4–7.0)
Neutrophils: 47 %
Platelets: 219 10*3/uL (ref 150–450)
RBC: 3.95 x10E6/uL — ABNORMAL LOW (ref 4.14–5.80)
RDW: 12.6 % (ref 11.6–15.4)
WBC: 3.1 10*3/uL — ABNORMAL LOW (ref 3.4–10.8)

## 2019-07-24 LAB — CMP14+EGFR
ALT: 39 IU/L (ref 0–44)
AST: 33 IU/L (ref 0–40)
Albumin/Globulin Ratio: 1.5 (ref 1.2–2.2)
Albumin: 4.5 g/dL (ref 4.0–5.0)
Alkaline Phosphatase: 107 IU/L (ref 39–117)
BUN/Creatinine Ratio: 11 (ref 9–20)
BUN: 8 mg/dL (ref 6–24)
Bilirubin Total: 0.4 mg/dL (ref 0.0–1.2)
CO2: 21 mmol/L (ref 20–29)
Calcium: 9.7 mg/dL (ref 8.7–10.2)
Chloride: 106 mmol/L (ref 96–106)
Creatinine, Ser: 0.73 mg/dL — ABNORMAL LOW (ref 0.76–1.27)
GFR calc Af Amer: 130 mL/min/{1.73_m2} (ref >59)
GFR calc non Af Amer: 113 mL/min/{1.73_m2} (ref >59)
Globulin, Total: 3.1 g/dL (ref 1.5–4.5)
Glucose: 85 mg/dL (ref 65–99)
Potassium: 4.4 mmol/L (ref 3.5–5.2)
Sodium: 138 mmol/L (ref 134–144)
Total Protein: 7.6 g/dL (ref 6.0–8.5)

## 2019-07-24 LAB — TSH: TSH: 1.44 u[IU]/mL (ref 0.450–4.500)

## 2019-07-28 ENCOUNTER — Encounter: Payer: Self-pay | Admitting: Nurse Practitioner

## 2019-07-29 ENCOUNTER — Telehealth: Payer: Self-pay | Admitting: Nurse Practitioner

## 2019-07-29 NOTE — Telephone Encounter (Signed)
CMA spoke to patient's fiance with patient by her side on the phone.  Pt. Stated the Losartan is making him sleepy and drowsy. CMA informed patient that PCP wanted patient to take 2 tablet until he see the clinical pharmacist.  CMA clarified what insulin he needs to be on.  Pt's fiance is concern on patient's loss of appetite, diarrhea since he started Victoza.  Will route to PCP for advising.

## 2019-07-29 NOTE — Telephone Encounter (Signed)
Patient fiance States patient is sleeping more often and has a loss of appetite. Please follow up   Patient fiance states the patient would like to get clarification on his medication and what insulin he should be taking if all three.

## 2019-07-31 ENCOUNTER — Other Ambulatory Visit: Payer: Self-pay | Admitting: Pharmacist

## 2019-07-31 MED ORDER — BASAGLAR KWIKPEN 100 UNIT/ML ~~LOC~~ SOPN: 30.0000 [IU] | PEN_INJECTOR | Freq: Every day | SUBCUTANEOUS | 2 refills | Status: DC

## 2019-07-31 NOTE — Telephone Encounter (Signed)
He can try to take the losartan at night. Did he titrate the victoza up for 3 weeks? It should have been titrated weekly till the final dose.

## 2019-08-01 MED FILL — ?BASAGLAR 100 UNITS/ML KWPE: 100 | 30 days supply | Qty: 9 | Fill #0

## 2019-08-01 NOTE — Telephone Encounter (Signed)
CMA called back patient and inform on PCP advising. Pt. Stated he think that day it was something he ate, after he threw up he felt better. Pt. Stated he taking the Victoza and following the dosage instruction.  Pt. Will resume back his medication.

## 2019-08-13 ENCOUNTER — Other Ambulatory Visit: Payer: Self-pay

## 2019-08-13 ENCOUNTER — Encounter: Payer: Self-pay | Admitting: Pharmacist

## 2019-08-13 ENCOUNTER — Ambulatory Visit: Payer: Self-pay | Attending: Family Medicine | Admitting: Pharmacist

## 2019-08-13 VITALS — BP 128/89 | HR 91

## 2019-08-13 DIAGNOSIS — I10 Essential (primary) hypertension: Secondary | ICD-10-CM

## 2019-08-13 MED ORDER — LOSARTAN POTASSIUM 50 MG PO TABS: 50.0000 mg | ORAL_TABLET | Freq: Every day | ORAL | 2 refills | Status: DC

## 2019-08-13 MED FILL — !NOVOLOG FLEXPEN SYRINGE 1: 100/ML | 20 days supply | Qty: 6 | Fill #2

## 2019-08-13 MED FILL — ?BASAGLAR 100 UNITS/ML KWPE: 100 | 30 days supply | Qty: 9 | Fill #0

## 2019-08-13 MED FILL — !VICTOZA 18MG/3ML INJECT: 18 | 30 days supply | Qty: 9 | Fill #1

## 2019-08-13 NOTE — Progress Notes (Signed)
    S:    PCP: Zelda   No chief complaint on file.  Patient arrives in good spirits.  Presents for BP check and home CBG review.  Patient was referred and last seen by Primary Care Provider on 07/23/19. BP was 148/110. Zelda increased losartan from 25 to 50 mg.   PMH: significant for DM s/p L BKA, HTN, pancreatitis, alcohol abuse disorder  Patient denies HA, chest pain, blurred vision. BLE edema.  Patient denies adherence with medications.  Current BP Medications include:  Losartan 25 mg daily (pt was instructed to take 2 25 mg tablets but has only been taking 1 tablet)  Antihypertensives tried in the past include: losartan at lower doses  Dietary habits include: admits to non-compliance with salt restriction and diabetic diet. Exercise habits include: limited d/t L BKA Family / Social history:  - DM (father) - former smoker (quit in 1997) - Alcohol: 3-4 drinks per week on weekends  O:  Physical Exam   ROS  Home BP readings: does not have machine  Home CBG: checks 3 times per week. "up and down depends on what I eat"  Last 3 Office BP readings: BP Readings from Last 3 Encounters:  07/23/19 (!) 148/110  04/19/19 (!) 139/97  01/21/19 128/90   BMET    Component Value Date/Time   NA 138 07/23/2019 1156   K 4.4 07/23/2019 1156   CL 106 07/23/2019 1156   CO2 21 07/23/2019 1156   GLUCOSE 85 07/23/2019 1156   GLUCOSE 116 (H) 10/12/2018 0324   BUN 8 07/23/2019 1156   CREATININE 0.73 (L) 07/23/2019 1156   CALCIUM 9.7 07/23/2019 1156   GFRNONAA 113 07/23/2019 1156   GFRAA 130 07/23/2019 1156   Renal function: CrCl cannot be calculated (Unknown ideal weight.).  CrCl: using AdBW; > 100 ml/min  Clinical ASCVD: No  The 10-year ASCVD risk score Mikey Bussing DC Jr., et al., 2013) is: 13.4%   Values used to calculate the score:     Age: 42 years     Sex: Male     Is Non-Hispanic African American: Yes     Diabetic: Yes     Tobacco smoker: No     Systolic Blood Pressure: 263  mmHg     Is BP treated: Yes     HDL Cholesterol: 85 mg/dL     Total Cholesterol: 201 mg/dL  A/P: Hypertension longstanding currently above goal on current medications. BP Goal <130/80 mmHg. Patient is not adherent with current medications.  -Started losartan 50 mg daily.  -Counseled on lifestyle modifications for blood pressure control including reduced dietary sodium, increased exercise, adequate sleep -HM due for tetanus; will address at follow-up  Results reviewed and written information provided.   Total time in face-to-face counseling 20 minutes.   F/U Clinic Visit in 1 month.    Patient seen with: Dillard Essex PharmD Candidate  Class of 2022 [place], PharmD, [place] [phone]

## 2019-08-13 NOTE — Patient Instructions (Signed)
Thank you for coming to see [place] today.   Blood pressure today is still above goal.   Start taking 50 mg of losartan a day. I have sent this to your pharmacy for pick-up.  Start checking blood sugars once daily if you can. We will talk more about these in 1 month.  Limiting salt and caffeine, as well as exercising as able for at least 30 minutes for 5 days out of the week, can also help you lower your blood pressure.  Take your blood pressure at home if you are able. Please write down these numbers and bring them to your visits.  If you have any questions about medications, please call me [phone].  Lurena Joiner

## 2019-08-16 ENCOUNTER — Other Ambulatory Visit: Payer: Self-pay

## 2019-08-16 ENCOUNTER — Ambulatory Visit: Payer: Self-pay | Attending: Family Medicine

## 2019-08-16 MED FILL — LOSARTAN POTASSIUM 50 MG TA: 50 | 30 days supply | Qty: 30 | Fill #0

## 2019-08-21 ENCOUNTER — Telehealth: Payer: Self-pay | Admitting: Nurse Practitioner

## 2019-08-21 NOTE — Telephone Encounter (Signed)
Patient called requesting a call back please follow up

## 2019-08-21 NOTE — Telephone Encounter (Signed)
Patient called wanting to know if their fax from this morning was received please follow up

## 2019-08-22 NOTE — Telephone Encounter (Signed)
Pt was inform that we still waiting for the missing documents

## 2019-09-12 ENCOUNTER — Ambulatory Visit: Payer: Medicaid Other | Admitting: Pharmacist

## 2019-09-18 ENCOUNTER — Ambulatory Visit: Payer: Self-pay | Attending: Family Medicine | Admitting: Pharmacist

## 2019-09-18 ENCOUNTER — Other Ambulatory Visit: Payer: Self-pay

## 2019-09-18 ENCOUNTER — Encounter: Payer: Self-pay | Admitting: Pharmacist

## 2019-09-18 VITALS — BP 140/96 | HR 98

## 2019-09-18 DIAGNOSIS — Z0131 Encounter for examination of blood pressure with abnormal findings: Secondary | ICD-10-CM

## 2019-09-18 DIAGNOSIS — I10 Essential (primary) hypertension: Secondary | ICD-10-CM

## 2019-09-18 MED FILL — TRUE METRIX TEST STRIP: 30 days supply | Qty: 100 | Fill #2

## 2019-09-18 MED FILL — LOSARTAN POTASSIUM 50 MG TA: 50 | 30 days supply | Qty: 30 | Fill #0

## 2019-09-18 NOTE — Progress Notes (Signed)
    S:    PCP: Zelda   No chief complaint on file.  Patient arrives in good spirits.  Presents for BP check and home CBG review.  Patient was referred and last seen by Primary Care Provider on 07/23/19. BP was 148/110. Zelda increased losartan from 25 to 50 mg.   PMH: significant for DM s/p L BKA, HTN, pancreatitis, alcohol abuse disorder  Patient denies HA, chest pain, blurred vision. BLE edema.  Patient denies adherence with medications.  Current BP Medications include:  Losartan 50mg  daily (pt reports that the pharmacy never gave him the 50 mg tablet)  Antihypertensives tried in the past include: losartan at lower doses  Dietary habits include: admits to non-compliance with salt restriction and diabetic diet. Exercise habits include: limited d/t L BKA Family / Social history:  - DM (father) - former smoker (quit in 1997) - Alcohol: 3-4 drinks per week on weekends  O:  Home BP readings: does not have machine  Last 3 Office BP readings: BP Readings from Last 3 Encounters:  09/18/19 (!) 140/96  08/13/19 128/89  07/23/19 (!) 148/110   BMET    Component Value Date/Time   NA 138 07/23/2019 1156   K 4.4 07/23/2019 1156   CL 106 07/23/2019 1156   CO2 21 07/23/2019 1156   GLUCOSE 85 07/23/2019 1156   GLUCOSE 116 (H) 10/12/2018 0324   BUN 8 07/23/2019 1156   CREATININE 0.73 (L) 07/23/2019 1156   CALCIUM 9.7 07/23/2019 1156   GFRNONAA 113 07/23/2019 1156   GFRAA 130 07/23/2019 1156   Renal function: CrCl cannot be calculated (Patient's most recent lab result is older than the maximum 21 days allowed.).  CrCl: using AdBW; > 100 ml/min  Clinical ASCVD: No  The 10-year ASCVD risk score Mikey Bussing DC Jr., et al., 2013) is: 12.1%   Values used to calculate the score:     Age: 43 years     Sex: Male     Is Non-Hispanic African American: Yes     Diabetic: Yes     Tobacco smoker: No     Systolic Blood Pressure: 119 mmHg     Is BP treated: Yes     HDL Cholesterol: 85 mg/dL     Total Cholesterol: 201 mg/dL  A/P: Hypertension longstanding currently above goal on current medications. BP Goal <130/80 mmHg. Patient is not adherent with current medications.  -Started losartan 50 mg daily.  -Counseled on lifestyle modifications for blood pressure control including reduced dietary sodium, increased exercise, adequate sleep -HM due for tetanus; will address at follow-up  Results reviewed and written information provided.   Total time in face-to-face counseling 20 minutes.   F/U Clinic Visit in 2 weeks.   Benard Halsted, PharmD, [place] [phone]

## 2019-10-02 ENCOUNTER — Ambulatory Visit: Payer: Medicaid Other | Admitting: Pharmacist

## 2019-10-22 ENCOUNTER — Ambulatory Visit: Payer: Self-pay | Attending: Nurse Practitioner | Admitting: Nurse Practitioner

## 2019-10-22 ENCOUNTER — Encounter: Payer: Self-pay | Admitting: Nurse Practitioner

## 2019-10-22 ENCOUNTER — Other Ambulatory Visit: Payer: Self-pay

## 2019-10-22 VITALS — BP 127/86 | HR 96 | Temp 98.8°F | Ht 72.0 in | Wt 205.0 lb

## 2019-10-22 DIAGNOSIS — E1165 Type 2 diabetes mellitus with hyperglycemia: Secondary | ICD-10-CM

## 2019-10-22 DIAGNOSIS — E782 Mixed hyperlipidemia: Secondary | ICD-10-CM

## 2019-10-22 DIAGNOSIS — E118 Type 2 diabetes mellitus with unspecified complications: Secondary | ICD-10-CM

## 2019-10-22 DIAGNOSIS — IMO0002 Reserved for concepts with insufficient information to code with codable children: Secondary | ICD-10-CM

## 2019-10-22 DIAGNOSIS — M7989 Other specified soft tissue disorders: Secondary | ICD-10-CM

## 2019-10-22 DIAGNOSIS — I10 Essential (primary) hypertension: Secondary | ICD-10-CM

## 2019-10-22 LAB — POCT GLYCOSYLATED HEMOGLOBIN (HGB A1C): Hemoglobin A1C: 7.4 % — AB (ref 4.0–5.6)

## 2019-10-22 LAB — GLUCOSE, POCT (MANUAL RESULT ENTRY): POC Glucose: 194 mg/dl — AB (ref 70–99)

## 2019-10-22 MED ORDER — TRUE METRIX BLOOD GLUCOSE TEST VI STRP: ORAL_STRIP | 12 refills | Status: DC

## 2019-10-22 MED ORDER — NOVOLOG FLEXPEN 100 UNIT/ML ~~LOC~~ SOPN: 10.0000 [IU] | PEN_INJECTOR | Freq: Three times a day (TID) | SUBCUTANEOUS | 3 refills | Status: DC

## 2019-10-22 MED ORDER — VICTOZA 18 MG/3ML ~~LOC~~ SOPN: 1.8000 mg | PEN_INJECTOR | Freq: Every day | SUBCUTANEOUS | 3 refills | Status: DC

## 2019-10-22 MED ORDER — BASAGLAR KWIKPEN 100 UNIT/ML ~~LOC~~ SOPN: 30.0000 [IU] | PEN_INJECTOR | Freq: Every day | SUBCUTANEOUS | 3 refills | Status: DC

## 2019-10-22 MED ORDER — LOSARTAN POTASSIUM 50 MG PO TABS: 50.0000 mg | ORAL_TABLET | Freq: Every day | ORAL | 2 refills | Status: DC

## 2019-10-22 MED ORDER — BD PEN NEEDLE MINI U/F 31G X 5 MM MISC: 6 refills | Status: DC

## 2019-10-22 MED ORDER — TRUEPLUS LANCETS 28G MISC: 3 refills | Status: DC

## 2019-10-22 MED ORDER — ATORVASTATIN CALCIUM 20 MG PO TABS: 20.0000 mg | ORAL_TABLET | Freq: Every day | ORAL | 3 refills | Status: DC

## 2019-10-22 MED FILL — ?BASAGLAR 100 UNITS/ML KWPE: 100 | 30 days supply | Qty: 9 | Fill #0

## 2019-10-22 MED FILL — VICTOZA 18 MG/3 ML INJECT P: 18 | 30 days supply | Qty: 9 | Fill #0

## 2019-10-22 MED FILL — LOSARTAN POTASSIUM 50 MG TA: 50 | 30 days supply | Qty: 30 | Fill #0

## 2019-10-22 MED FILL — TRUEPLUS PEN NDL 31GX5/16": 31G X 8 MM | 25 days supply | Qty: 100 | Fill #0

## 2019-10-22 MED FILL — ?ATORVASTATIN 20 MG TABLET: 20 | 30 days supply | Qty: 30 | Fill #0

## 2019-10-22 MED FILL — TRUEplus LANCETS 28G MISC: 30 days supply | Qty: 100 | Fill #0

## 2019-10-22 MED FILL — !NOVOLOG FLEXPEN SYRINGE 1: 100/ML | 20 days supply | Qty: 6 | Fill #0

## 2019-10-22 MED FILL — TRUE METRIX TEST STRIP: 30 days supply | Qty: 100 | Fill #0

## 2019-10-22 NOTE — Progress Notes (Signed)
Assessment & Plan:  [name] was seen today for follow-up.  Diagnoses and all orders for this visit:  Diabetes mellitus type 2, uncontrolled, with complications (HCC) -     Glucose (CBG) -     HgB A1c -     BMP -     Insulin Glargine (BASAGLAR KWIKPEN) 100 UNIT/ML SOPN; Inject 0.3 mLs (30 Units total) into the skin at bedtime. -     insulin aspart (NOVOLOG FLEXPEN) 100 UNIT/ML FlexPen; Inject 10 Units into the skin 3 (three) times daily with meals. -     Insulin Pen Needle (B-D UF III MINI PEN NEEDLES) 31G X 5 MM MISC; Use as instructed. Inject into the skin 4 times per day -     liraglutide (VICTOZA) 18 MG/3ML SOPN; Inject 0.3 mLs (1.8 mg total) into the skin daily. -     glucose blood (TRUE METRIX BLOOD GLUCOSE TEST) test strip; Use as instructed. Monitor blood glucose levels three times per day. -     TRUEplus Lancets 28G MISC; Use as instructed. Monitor blood glucose levels three times per day. Continue blood sugar control as discussed in office today, low carbohydrate diet, and regular physical exercise as tolerated, 150 minutes per week (30 min each day, 5 days per week, or 50 min 3 days per week). Keep blood sugar logs with fasting goal of 90-130 mg/dl, post prandial (after you eat) less than 180.  For Hypoglycemia: BS <60 and Hyperglycemia BS >400; contact the clinic ASAP. Annual eye exams and foot exams are recommended.  Essential hypertension -     losartan (COZAAR) 50 MG tablet; Take 1 tablet (50 mg total) by mouth daily. Continue all antihypertensives as prescribed.  Remember to bring in your blood pressure log with you for your follow up appointment.  DASH/Mediterranean Diets are healthier choices for HTN.   Mixed hyperlipidemia -     Lipid Panel -     atorvastatin (LIPITOR) 20 MG tablet; Take 1 tablet (20 mg total) by mouth daily. INSTRUCTIONS: Work on a low fat, heart healthy diet and participate in regular aerobic exercise program by working out at least 150 minutes per  week; 5 days a week-30 minutes per day. Avoid red meat/beef/steak,  fried foods. junk foods, sodas, sugary drinks, unhealthy snacking, alcohol and smoking.  Drink at least 80 oz of water per day and monitor your carbohydrate intake daily.    Soft tissue mass -     Ambulatory referral to General Surgery    Patient has been counseled on age-appropriate routine health concerns for screening and prevention. These are reviewed and up-to-date. Referrals have been placed accordingly. Immunizations are up-to-date or declined.    Subjective:   Chief Complaint  Patient presents with  . Follow-up    Pt. is here for DM follow up.    HPI [name] 43 y.o. male presents to office today for follow up.  has a past medical history of Anemia, Bleeding, Blood transfusion, Diabetes mellitus, Hyperlipidemia, and Hypertension.   Still has not been approved for medicaid/disability. Was denied for disability but   Hypertension He is not exercising but reports staying active and is not adherent to low salt diet.  He does not have a blood pressure log  today. Current BP meds: Losartan 50 mg daily.  Blood pressure is well controlled today Denies chest pain, shortness of breath, palpitations, lightheadedness, dizziness, headaches or BLE edema.  BP Readings from Last 3 Encounters:  10/22/19 127/86  09/18/19 Marland Kitchen)  140/96  08/13/19 128/89    Hyperlipidemia Patient presents for follow up to hyperlipidemia.  He is medication compliant taking atorvastatin 20 mg daily. He is not consistently diet compliant and denies  statin intolerance including myalgias. LDL not at goal <70.  Will repeat today Lab Results  Component Value Date   CHOL 201 (H) 04/19/2019   Lab Results  Component Value Date   HDL 85 04/19/2019   Lab Results  Component Value Date   LDLCALC 101 (H) 04/19/2019   Lab Results  Component Value Date   TRIG 77 04/19/2019   Lab Results  Component Value Date   CHOLHDL 2.4 04/19/2019       Diabetes Mellitus Type II IMPROVING!!! Current symptoms/problems include paresthesia of the feet and visual disturbances and have been stable.  Known diabetic complications: peripheral neuropathy Cardiovascular risk factors: diabetes mellitus, dyslipidemia, hypertension and male gender Current diabetic medications include: NovoLog 10 units 3 times daily, Basaglar 30 units daily and Victoza 1.8 mg daily. Eye exam current (within one year): no Weight trend: increasing steadily Prior visit with dietician: no Current monitoring regimen: home blood tests - 2 times weekly Home blood sugar records: fasting range: 130-190s Any episodes of hypoglycemia? no Is He on ACE inhibitor or angiotensin II receptor blocker?  Yes  Lab Results  Component Value Date   HGBA1C 7.4 (A) 10/22/2019   HGBA1C 9.2 (A) 07/23/2019   HGBA1C 9.4 (A) 04/19/2019       Review of Systems  Constitutional: Negative for fever, malaise/fatigue and weight loss.  HENT: Negative.  Negative for nosebleeds.   Eyes: Negative.  Negative for blurred vision, double vision and photophobia.  Respiratory: Negative.  Negative for cough and shortness of breath.   Cardiovascular: Negative.  Negative for chest pain, palpitations and leg swelling.  Gastrointestinal: Negative.  Negative for heartburn, nausea and vomiting.  Musculoskeletal: Negative.  Negative for myalgias.  Neurological: Positive for sensory change. Negative for dizziness, focal weakness, seizures and headaches.  Psychiatric/Behavioral: Negative.  Negative for suicidal ideas.    Past Medical History:  Diagnosis Date  . Anemia   . Bleeding    hx internal bleeding 1995 due to accident  . Blood transfusion   . Diabetes mellitus   . Hyperlipidemia   . Hypertension     Past Surgical History:  Procedure Laterality Date  . AMPUTATION Left 10/10/2018   Procedure: LEFT BELOW KNEE AMPUTATION;  Surgeon: Newt Minion, MD;  Location: [place];  Service: Orthopedics;   Laterality: Left;  . EXPLORATORY LAPAROTOMY      Family History  Problem Relation Age of Onset  . Arthritis Mother   . Diabetes Mellitus II Father     Social History Reviewed with no changes to be made today.   Outpatient Medications Prior to Visit  Medication Sig Dispense Refill  . acetaminophen (TYLENOL) 325 MG tablet Take 1-2 tablets (325-650 mg total) by mouth every 6 (six) hours as needed for mild pain (pain score 1-3 or temp > 100.5).    Marland Kitchen Blood Glucose Monitoring Suppl (TRUE METRIX METER) w/Device KIT Use as instructed 1 kit 0  . atorvastatin (LIPITOR) 20 MG tablet Take 1 tablet (20 mg total) by mouth daily. 90 tablet 3  . glucose blood (TRUE METRIX BLOOD GLUCOSE TEST) test strip Use as instructed. Monitor blood glucose levels three times per day. 100 each 12  . Insulin Glargine (BASAGLAR KWIKPEN) 100 UNIT/ML SOPN Inject 0.3 mLs (30 Units total) into the skin daily. 9 mL  2  . Insulin Pen Needle (B-D UF III MINI PEN NEEDLES) 31G X 5 MM MISC Use as instructed. Inject into the skin 4 times per day 200 each 6  . liraglutide (VICTOZA) 18 MG/3ML SOPN SubQ:  Inject 0.6 mg once daily into the skin. Week 2: increase to 1.2 mg once daily; week 3: increase to 1.8 mg once daily 3 pen 6  . losartan (COZAAR) 50 MG tablet Take 1 tablet (50 mg total) by mouth daily. 30 tablet 2  . TRUEPLUS LANCETS 28G MISC Use as instructed. Monitor blood glucose levels three times per day. 100 each 3  . venlafaxine XR (EFFEXOR XR) 37.5 MG 24 hr capsule Take 1 capsule (37.5 mg total) by mouth daily with breakfast. 90 capsule 1  . insulin aspart (NOVOLOG FLEXPEN) 100 UNIT/ML FlexPen Inject 10 Units into the skin 3 (three) times daily with meals. Per sliding scale. 15 mL 3   No facility-administered medications prior to visit.     No Known Allergies     Objective:    BP 127/86 (BP Location: Right Arm, Patient Position: Sitting, Cuff Size: Normal)   Pulse 96   Temp 98.8 F (37.1 C) (Oral)   Ht 6' (1.829 m)    Wt 205 lb (93 kg)   SpO2 97%   BMI 27.80 kg/m  Wt Readings from Last 3 Encounters:  10/22/19 205 lb (93 kg)  07/23/19 201 lb (91.2 kg)  07/22/19 196 lb (88.9 kg)    Physical Exam Vitals signs and nursing note reviewed.  Constitutional:      Appearance: He is well-developed.  HENT:     Head: Normocephalic and atraumatic.   Neck:     Musculoskeletal: Normal range of motion.  Cardiovascular:     Rate and Rhythm: Normal rate and regular rhythm.     Pulses:          Posterior tibial pulses are 2+ on the right side.     Heart sounds: Normal heart sounds. No murmur. No friction rub. No gallop.   Pulmonary:     Effort: Pulmonary effort is normal. No tachypnea or respiratory distress.     Breath sounds: Normal breath sounds. No decreased breath sounds, wheezing, rhonchi or rales.  Chest:     Chest wall: No tenderness.  Abdominal:     General: Bowel sounds are normal.     Palpations: Abdomen is soft.    Musculoskeletal: Normal range of motion.       Legs:     Right foot: Bunion present.  Feet:     Right foot:     Protective Sensation: 10 sites tested. 3 sites sensed.     Skin integrity: Warmth, callus and dry skin present. No ulcer, blister, skin breakdown or erythema.     Toenail Condition: Right toenails are abnormally thick.     Left foot:     Amputation: Left leg is amputated below knee.  Skin:    General: Skin is warm and dry.  Neurological:     Mental Status: He is alert and oriented to person, place, and time.     Coordination: Coordination normal.  Psychiatric:        Behavior: Behavior normal. Behavior is cooperative.        Thought Content: Thought content normal.        Judgment: Judgment normal.          Patient has been counseled extensively about nutrition and exercise as well as the importance of  adherence with medications and regular follow-up. The patient was given clear instructions to go to ER or return to medical center if symptoms don't  improve, worsen or new problems develop. The patient verbalized understanding.   Follow-up: Return for see me for physical and then see me in 3 months.   Gildardo Pounds, FNP-BC [facility], [place]   10/22/2019, 12:56 PM

## 2019-10-23 ENCOUNTER — Ambulatory Visit: Payer: Medicaid Other | Admitting: Nurse Practitioner

## 2019-10-23 LAB — LIPID PANEL
Chol/HDL Ratio: 1.9 ratio (ref 0.0–5.0)
Cholesterol, Total: 203 mg/dL — ABNORMAL HIGH (ref 100–199)
HDL: 108 mg/dL (ref >39)
LDL Chol Calc (NIH): 78 mg/dL (ref 0–99)
Triglycerides: 97 mg/dL (ref 0–149)
VLDL Cholesterol Cal: 17 mg/dL (ref 5–40)

## 2019-10-23 LAB — BASIC METABOLIC PANEL
BUN/Creatinine Ratio: 7 — ABNORMAL LOW (ref 9–20)
BUN: 6 mg/dL (ref 6–24)
CO2: 21 mmol/L (ref 20–29)
Calcium: 9.7 mg/dL (ref 8.7–10.2)
Chloride: 100 mmol/L (ref 96–106)
Creatinine, Ser: 0.87 mg/dL (ref 0.76–1.27)
GFR calc Af Amer: 121 mL/min/{1.73_m2} (ref >59)
GFR calc non Af Amer: 105 mL/min/{1.73_m2} (ref >59)
Glucose: 176 mg/dL — ABNORMAL HIGH (ref 65–99)
Potassium: 5 mmol/L (ref 3.5–5.2)
Sodium: 138 mmol/L (ref 134–144)

## 2019-10-31 ENCOUNTER — Ambulatory Visit: Payer: Self-pay | Admitting: Surgery

## 2019-11-07 ENCOUNTER — Ambulatory Visit: Payer: Self-pay | Admitting: Surgery

## 2019-12-10 ENCOUNTER — Telehealth: Payer: Self-pay | Admitting: Orthopedic Surgery

## 2019-12-10 NOTE — Telephone Encounter (Signed)
I called pt and advised pt that order faxed to [employer] for left BKA prosthetic and supplies.

## 2019-12-10 NOTE — Telephone Encounter (Signed)
Patient called.   He was given a prescription for a prosthetic about 5-6 months ago but couldn't afford it at the time. He is now able to but needs a new prescription.   Call back number: [phone] (M)

## 2019-12-27 MED FILL — TRUE METRIX TEST STRIP: 30 days supply | Qty: 100 | Fill #1

## 2019-12-27 MED FILL — TRUEPLUS PEN NDL 31GX5/16: 31G X 8 MM | 25 days supply | Qty: 100 | Fill #1

## 2019-12-30 MED FILL — !VICTOZA 18MG/3ML INJECT: 18 | 30 days supply | Qty: 9 | Fill #1

## 2020-01-22 ENCOUNTER — Other Ambulatory Visit: Payer: Self-pay

## 2020-01-22 ENCOUNTER — Ambulatory Visit: Payer: Self-pay | Admitting: Nurse Practitioner

## 2020-01-30 MED FILL — TRUE METRIX TEST STRIP: 30 days supply | Qty: 100 | Fill #2

## 2020-02-04 ENCOUNTER — Ambulatory Visit: Payer: Self-pay | Attending: Nurse Practitioner | Admitting: Nurse Practitioner

## 2020-02-04 ENCOUNTER — Encounter: Payer: Self-pay | Admitting: Nurse Practitioner

## 2020-02-04 ENCOUNTER — Other Ambulatory Visit: Payer: Self-pay

## 2020-02-04 VITALS — BP 144/93 | HR 97 | Temp 97.9°F | Ht 72.0 in | Wt 221.0 lb

## 2020-02-04 DIAGNOSIS — Z0000 Encounter for general adult medical examination without abnormal findings: Secondary | ICD-10-CM

## 2020-02-04 DIAGNOSIS — I10 Essential (primary) hypertension: Secondary | ICD-10-CM

## 2020-02-04 DIAGNOSIS — F331 Major depressive disorder, recurrent, moderate: Secondary | ICD-10-CM

## 2020-02-04 DIAGNOSIS — E1165 Type 2 diabetes mellitus with hyperglycemia: Secondary | ICD-10-CM

## 2020-02-04 DIAGNOSIS — D649 Anemia, unspecified: Secondary | ICD-10-CM

## 2020-02-04 DIAGNOSIS — E871 Hypo-osmolality and hyponatremia: Secondary | ICD-10-CM

## 2020-02-04 DIAGNOSIS — E782 Mixed hyperlipidemia: Secondary | ICD-10-CM

## 2020-02-04 DIAGNOSIS — E118 Type 2 diabetes mellitus with unspecified complications: Secondary | ICD-10-CM

## 2020-02-04 DIAGNOSIS — IMO0002 Reserved for concepts with insufficient information to code with codable children: Secondary | ICD-10-CM

## 2020-02-04 LAB — GLUCOSE, POCT (MANUAL RESULT ENTRY): POC Glucose: 258 mg/dl — AB (ref 70–99)

## 2020-02-04 MED ORDER — ATORVASTATIN CALCIUM 20 MG PO TABS: 20.0000 mg | ORAL_TABLET | Freq: Every day | ORAL | 3 refills | Status: DC

## 2020-02-04 MED ORDER — BASAGLAR KWIKPEN 100 UNIT/ML ~~LOC~~ SOPN: 30.0000 [IU] | PEN_INJECTOR | Freq: Every day | SUBCUTANEOUS | 3 refills | Status: DC

## 2020-02-04 MED ORDER — NOVOLOG FLEXPEN 100 UNIT/ML ~~LOC~~ SOPN: 10.0000 [IU] | PEN_INJECTOR | Freq: Three times a day (TID) | SUBCUTANEOUS | 3 refills | Status: DC

## 2020-02-04 MED ORDER — VICTOZA 18 MG/3ML ~~LOC~~ SOPN: 1.8000 mg | PEN_INJECTOR | Freq: Every day | SUBCUTANEOUS | 3 refills | Status: DC

## 2020-02-04 MED ORDER — BD PEN NEEDLE MINI U/F 31G X 5 MM MISC: 6 refills | Status: DC

## 2020-02-04 MED ORDER — TRUE METRIX BLOOD GLUCOSE TEST VI STRP: ORAL_STRIP | 12 refills | Status: DC

## 2020-02-04 MED ORDER — LOSARTAN POTASSIUM 50 MG PO TABS: 50.0000 mg | ORAL_TABLET | Freq: Every day | ORAL | 2 refills | Status: DC

## 2020-02-04 MED ORDER — ESCITALOPRAM OXALATE 10 MG PO TABS: 10.0000 mg | ORAL_TABLET | Freq: Every day | ORAL | 1 refills | Status: DC

## 2020-02-04 MED FILL — VICTOZA 18 MG/3 ML INJECT P: 18 | 30 days supply | Qty: 9 | Fill #0

## 2020-02-04 MED FILL — !NOVOLOG FLEXPEN SYRINGE 1: 100/ML | 30 days supply | Qty: 9 | Fill #0

## 2020-02-04 MED FILL — LOSARTAN POTASSIUM 50 MG TA: 50 | 30 days supply | Qty: 30 | Fill #0

## 2020-02-04 MED FILL — TRUEPLUS 5-BEVEL PEN NEEDLE: 31G X 5 MM | 25 days supply | Qty: 100 | Fill #0

## 2020-02-04 MED FILL — ?BASAGLAR 100 UNITS/ML KWPE: 100 | 30 days supply | Qty: 9 | Fill #0

## 2020-02-04 MED FILL — ATORVASTATIN CALCIUM 20 MG: 20 | 30 days supply | Qty: 30 | Fill #0

## 2020-02-04 MED FILL — ESCITALOPRAM 10 MG TABLET: 10 | 30 days supply | Qty: 30 | Fill #0

## 2020-02-04 NOTE — Progress Notes (Signed)
Assessment & Plan:  [name] was seen today for annual exam.  Diagnoses and all orders for this visit:  Encounter for annual physical exam  Diabetes mellitus type 2, uncontrolled, with complications ([place]) -     Glucose (CBG) -     Hemoglobin A1c -     Lipid panel -     insulin aspart (NOVOLOG FLEXPEN) 100 UNIT/ML FlexPen; Inject 10 Units into the skin 3 (three) times daily with meals. -     Insulin Glargine (BASAGLAR KWIKPEN) 100 UNIT/ML; Inject 0.3 mLs (30 Units total) into the skin at bedtime. -     liraglutide (VICTOZA) 18 MG/3ML SOPN; Inject 0.3 mLs (1.8 mg total) into the skin daily. -     Insulin Pen Needle (B-D UF III MINI PEN NEEDLES) 31G X 5 MM MISC; Use as instructed. Inject into the skin 4 times per day -     glucose blood (TRUE METRIX BLOOD GLUCOSE TEST) test strip; Use as instructed. Monitor blood glucose levels three times per day. Continue blood sugar control as discussed in office today, low carbohydrate diet, and regular physical exercise as tolerated, 150 minutes per week (30 min each day, 5 days per week, or 50 min 3 days per week). Keep blood sugar logs with fasting goal of 90-130 mg/dl, post prandial (after you eat) less than 180.  For Hypoglycemia: BS <60 and Hyperglycemia BS >400; contact the clinic ASAP. Annual eye exams and foot exams are recommended.   Hyponatremia -     CMP14+EGFR  Anemia, unspecified type -     CBC  Essential hypertension -     losartan (COZAAR) 50 MG tablet; Take 1 tablet (50 mg total) by mouth daily. Continue all antihypertensives as prescribed.  Remember to bring in your blood pressure log with you for your follow up appointment.  DASH/Mediterranean Diets are healthier choices for HTN.    Mixed hyperlipidemia -     atorvastatin (LIPITOR) 20 MG tablet; Take 1 tablet (20 mg total) by mouth daily. NOT at GOAL.  INSTRUCTIONS: Work on a low fat, heart healthy diet and participate in regular aerobic exercise program by working out at  least 150 minutes per week; 5 days a week-30 minutes per day. Avoid red meat/beef/steak,  fried foods. junk foods, sodas, sugary drinks, unhealthy snacking, alcohol and smoking.  Drink at least 80 oz of water per day and monitor your carbohydrate intake daily.  Lab Results  Component Value Date   LDLCALC 93 02/04/2020    Moderate episode of recurrent major depressive disorder (HCC) -     escitalopram (LEXAPRO) 10 MG tablet; Take 1 tablet (10 mg total) by mouth daily.    Patient has been counseled on age-appropriate routine health concerns for screening and prevention. These are reviewed and up-to-date. Referrals have been placed accordingly. Immunizations are up-to-date or declined.    Subjective:   Chief Complaint  Patient presents with  . Annual Exam    Pt. is here for a physical.    HPI [name] 44 y.o. male presents to office today for annual physical.    Review of Systems  Constitutional: Negative for fever, malaise/fatigue and weight loss.  HENT: Negative.  Negative for nosebleeds.   Eyes: Negative.  Negative for blurred vision, double vision and photophobia.  Respiratory: Negative.  Negative for cough and shortness of breath.   Cardiovascular: Negative.  Negative for chest pain, palpitations and leg swelling.  Gastrointestinal: Negative.  Negative for heartburn, nausea and vomiting.  Genitourinary: Negative.   Musculoskeletal: Negative.  Negative for myalgias.  Skin: Negative.   Neurological: Positive for sensory change. Negative for dizziness, focal weakness, seizures and headaches.  Endo/Heme/Allergies: Negative.   Psychiatric/Behavioral: Positive for depression. Negative for suicidal ideas.    Past Medical History:  Diagnosis Date  . Anemia   . Bleeding    hx internal bleeding 1995 due to accident  . Blood transfusion   . Diabetes mellitus   . Hyperlipidemia   . Hypertension     Past Surgical History:  Procedure Laterality Date  . AMPUTATION Left  10/10/2018   Procedure: LEFT BELOW KNEE AMPUTATION;  Surgeon: Newt Minion, MD;  Location: [place];  Service: Orthopedics;  Laterality: Left;  . EXPLORATORY LAPAROTOMY      Family History  Problem Relation Age of Onset  . Arthritis Mother   . Diabetes Mellitus II Father     Social History Reviewed with no changes to be made today.   Outpatient Medications Prior to Visit  Medication Sig Dispense Refill  . acetaminophen (TYLENOL) 325 MG tablet Take 1-2 tablets (325-650 mg total) by mouth every 6 (six) hours as needed for mild pain (pain score 1-3 or temp > 100.5).    Marland Kitchen Blood Glucose Monitoring Suppl (TRUE METRIX METER) w/Device KIT Use as instructed 1 kit 0  . TRUEplus Lancets 28G MISC Use as instructed. Monitor blood glucose levels three times per day. 100 each 3  . atorvastatin (LIPITOR) 20 MG tablet Take 1 tablet (20 mg total) by mouth daily. 90 tablet 3  . glucose blood (TRUE METRIX BLOOD GLUCOSE TEST) test strip Use as instructed. Monitor blood glucose levels three times per day. 100 each 12  . Insulin Glargine (BASAGLAR KWIKPEN) 100 UNIT/ML SOPN Inject 0.3 mLs (30 Units total) into the skin at bedtime. 9 mL 3  . Insulin Pen Needle (B-D UF III MINI PEN NEEDLES) 31G X 5 MM MISC Use as instructed. Inject into the skin 4 times per day 200 each 6  . losartan (COZAAR) 50 MG tablet Take 1 tablet (50 mg total) by mouth daily. 30 tablet 2  . insulin aspart (NOVOLOG FLEXPEN) 100 UNIT/ML FlexPen Inject 10 Units into the skin 3 (three) times daily with meals. 15 mL 3  . liraglutide (VICTOZA) 18 MG/3ML SOPN Inject 0.3 mLs (1.8 mg total) into the skin daily. 30 mL 3  . venlafaxine XR (EFFEXOR XR) 37.5 MG 24 hr capsule Take 1 capsule (37.5 mg total) by mouth daily with breakfast. 90 capsule 1   No facility-administered medications prior to visit.    No Known Allergies     Objective:    BP (!) 144/93 (BP Location: Right Arm, Patient Position: Sitting, Cuff Size: Normal)   Pulse 97   Temp  97.9 F (36.6 C) (Temporal)   Ht 6' (1.829 m)   Wt 221 lb (100.2 kg)   SpO2 97%   BMI 29.97 kg/m  Wt Readings from Last 3 Encounters:  02/04/20 221 lb (100.2 kg)  10/22/19 205 lb (93 kg)  07/23/19 201 lb (91.2 kg)    Physical Exam Constitutional:      Appearance: He is well-developed.  HENT:     Head: Normocephalic and atraumatic.     Right Ear: Hearing, tympanic membrane, ear canal and external ear normal.     Left Ear: Hearing, tympanic membrane, ear canal and external ear normal.     Nose: Nose normal. No mucosal edema or rhinorrhea.     Mouth/Throat:  Pharynx: Uvula midline.     Tonsils: No tonsillar exudate. 1+ on the right. 1+ on the left.  Eyes:     General: Lids are normal. No scleral icterus.    Conjunctiva/sclera: Conjunctivae normal.     Pupils: Pupils are equal, round, and reactive to light.  Neck:     Thyroid: No thyromegaly.     Trachea: No tracheal deviation.  Cardiovascular:     Rate and Rhythm: Normal rate and regular rhythm.     Heart sounds: Normal heart sounds. No murmur. No friction rub. No gallop.   Pulmonary:     Effort: Pulmonary effort is normal. No respiratory distress.     Breath sounds: Normal breath sounds. No wheezing or rales.  Chest:     Chest wall: No mass or tenderness.     Breasts:        Right: No inverted nipple, mass, nipple discharge, skin change or tenderness.        Left: No inverted nipple, mass, nipple discharge, skin change or tenderness.  Abdominal:     General: Bowel sounds are normal. There is no distension.     Palpations: Abdomen is soft. There is no mass.     Tenderness: There is no abdominal tenderness. There is no guarding or rebound.     Hernia: There is no hernia in the left inguinal area.  Genitourinary:    Penis: Normal.      Testes: Normal.        Right: Mass, tenderness or swelling not present. Right testis is descended. Cremasteric reflex is present.         Left: Mass, tenderness or swelling not present.  Left testis is descended. Cremasteric reflex is present.   Musculoskeletal:        General: No tenderness or deformity. Normal range of motion.     Cervical back: Normal range of motion and neck supple.     Left Lower Extremity: Left leg is amputated below knee.  Lymphadenopathy:     Cervical: No cervical adenopathy.     Lower Body: No right inguinal adenopathy. No left inguinal adenopathy.  Skin:    General: Skin is warm and dry.     Capillary Refill: Capillary refill takes less than 2 seconds.     Findings: No erythema.  Neurological:     Mental Status: He is alert and oriented to person, place, and time.     Cranial Nerves: No cranial nerve deficit.     Motor: No abnormal muscle tone.     Coordination: Coordination normal.     Deep Tendon Reflexes: Reflexes normal.  Psychiatric:        Behavior: Behavior normal.        Thought Content: Thought content normal.        Judgment: Judgment normal.          Patient has been counseled extensively about nutrition and exercise as well as the importance of adherence with medications and regular follow-up. The patient was given clear instructions to go to ER or return to medical center if symptoms don't improve, worsen or new problems develop. The patient verbalized understanding.   Follow-up: Return in about 3 weeks (around 02/25/2020) for in office depression.   Gildardo Pounds, FNP-BC [facility] and [place], [place]   02/22/2020, 7:08 PM

## 2020-02-05 LAB — CMP14+EGFR
ALT: 51 IU/L — ABNORMAL HIGH (ref 0–44)
AST: 62 IU/L — ABNORMAL HIGH (ref 0–40)
Albumin/Globulin Ratio: 1.2 (ref 1.2–2.2)
Albumin: 4.5 g/dL (ref 4.0–5.0)
Alkaline Phosphatase: 104 IU/L (ref 39–117)
BUN/Creatinine Ratio: 7 — ABNORMAL LOW (ref 9–20)
BUN: 6 mg/dL (ref 6–24)
Bilirubin Total: 1 mg/dL (ref 0.0–1.2)
CO2: 19 mmol/L — ABNORMAL LOW (ref 20–29)
Calcium: 9.9 mg/dL (ref 8.7–10.2)
Chloride: 96 mmol/L (ref 96–106)
Creatinine, Ser: 0.82 mg/dL (ref 0.76–1.27)
GFR calc Af Amer: 124 mL/min/{1.73_m2} (ref >59)
GFR calc non Af Amer: 108 mL/min/{1.73_m2} (ref >59)
Globulin, Total: 3.7 g/dL (ref 1.5–4.5)
Glucose: 243 mg/dL — ABNORMAL HIGH (ref 65–99)
Potassium: 4.7 mmol/L (ref 3.5–5.2)
Sodium: 132 mmol/L — ABNORMAL LOW (ref 134–144)
Total Protein: 8.2 g/dL (ref 6.0–8.5)

## 2020-02-05 LAB — CBC
Hematocrit: 40 % (ref 37.5–51.0)
Hemoglobin: 14.2 g/dL (ref 13.0–17.7)
MCH: 35.1 pg — ABNORMAL HIGH (ref 26.6–33.0)
MCHC: 35.5 g/dL (ref 31.5–35.7)
MCV: 99 fL — ABNORMAL HIGH (ref 79–97)
Platelets: 191 10*3/uL (ref 150–450)
RBC: 4.05 x10E6/uL — ABNORMAL LOW (ref 4.14–5.80)
RDW: 12.7 % (ref 11.6–15.4)
WBC: 2.8 10*3/uL — ABNORMAL LOW (ref 3.4–10.8)

## 2020-02-05 LAB — LIPID PANEL
Chol/HDL Ratio: 2 ratio (ref 0.0–5.0)
Cholesterol, Total: 207 mg/dL — ABNORMAL HIGH (ref 100–199)
HDL: 103 mg/dL (ref >39)
LDL Chol Calc (NIH): 93 mg/dL (ref 0–99)
Triglycerides: 61 mg/dL (ref 0–149)
VLDL Cholesterol Cal: 11 mg/dL (ref 5–40)

## 2020-02-05 LAB — HEMOGLOBIN A1C
Est. average glucose Bld gHb Est-mCnc: 209 mg/dL
Hgb A1c MFr Bld: 8.9 % — ABNORMAL HIGH (ref 4.8–5.6)

## 2020-02-10 ENCOUNTER — Other Ambulatory Visit: Payer: Self-pay | Admitting: Nurse Practitioner

## 2020-02-10 ENCOUNTER — Telehealth: Payer: Self-pay | Admitting: Hematology and Oncology

## 2020-02-10 DIAGNOSIS — D72819 Decreased white blood cell count, unspecified: Secondary | ICD-10-CM

## 2020-02-10 NOTE — Telephone Encounter (Signed)
Scheduled per referral. Called and spoke with pt, confirmed 4/12 appt. Pt made aware to arrive 15-30 mins early and to bring insurance card with photo ID

## 2020-02-22 ENCOUNTER — Encounter: Payer: Self-pay | Admitting: Nurse Practitioner

## 2020-02-25 ENCOUNTER — Ambulatory Visit: Payer: Medicaid Other | Admitting: Nurse Practitioner

## 2020-03-04 ENCOUNTER — Encounter: Payer: Self-pay | Admitting: Nurse Practitioner

## 2020-03-04 ENCOUNTER — Other Ambulatory Visit: Payer: Self-pay

## 2020-03-04 ENCOUNTER — Ambulatory Visit: Payer: Medicaid Other | Attending: Nurse Practitioner | Admitting: Nurse Practitioner

## 2020-03-04 DIAGNOSIS — Z87891 Personal history of nicotine dependence: Secondary | ICD-10-CM | POA: Insufficient documentation

## 2020-03-04 DIAGNOSIS — E785 Hyperlipidemia, unspecified: Secondary | ICD-10-CM | POA: Diagnosis not present

## 2020-03-04 DIAGNOSIS — Z1331 Encounter for screening for depression: Secondary | ICD-10-CM | POA: Insufficient documentation

## 2020-03-04 DIAGNOSIS — I10 Essential (primary) hypertension: Secondary | ICD-10-CM | POA: Diagnosis not present

## 2020-03-04 DIAGNOSIS — E119 Type 2 diabetes mellitus without complications: Secondary | ICD-10-CM | POA: Diagnosis not present

## 2020-03-04 DIAGNOSIS — F329 Major depressive disorder, single episode, unspecified: Secondary | ICD-10-CM | POA: Insufficient documentation

## 2020-03-04 DIAGNOSIS — F324 Major depressive disorder, single episode, in partial remission: Secondary | ICD-10-CM

## 2020-03-04 NOTE — Progress Notes (Signed)
Virtual Visit via Telephone Note Due to national recommendations of social distancing due to [place] 19, telehealth visit is felt to be most appropriate for this patient at this time.  I discussed the limitations, risks, security and privacy concerns of performing an evaluation and management service by telephone and the availability of in person appointments. I also discussed with the patient that there may be a patient responsible charge related to this service. The patient expressed understanding and agreed to proceed.    I connected with [name] on 03/04/20  at  10:10 AM EDT  EDT by telephone and verified that I am speaking with the correct person using two identifiers.   Consent I discussed the limitations, risks, security and privacy concerns of performing an evaluation and management service by telephone and the availability of in person appointments. I also discussed with the patient that there may be a patient responsible charge related to this service. The patient expressed understanding and agreed to proceed.   Location of Patient: Private Residence    Location of Provider: [place] [facility] [place] participating in Telemedicine visit: [name] FNP-BC [name]    History of Present Illness: Telemedicine visit for: Depression   He stopped taking lexapro after 2 weeks. Reports increased drowsiness and "just didn't like the way it made me feel". He endorsed the same symptoms with Effexor last year which he stopped taking.   PhQ 9 score has improved significantly today. At this time he is agreeable to not taking any SSRI and we will continue to monitor. He denies any current thoughts of self harm.  Depression screen [facility] 2/9 03/04/2020 02/04/2020 10/22/2019 07/23/2019 04/19/2019  Decreased Interest 0 2 2 2 2   Down, Depressed, Hopeless 1 3 2 2 2   PHQ - 2 Score 1 5 4 4 4   Altered sleeping 0 2 1 2 3   Tired, decreased energy 0 1 1 1 1    Change in appetite 0 1 0 2 0  Feeling bad or failure about yourself  0 2 1 1  0  Trouble concentrating 0 2 2 2 2   Moving slowly or fidgety/restless 0 0 1 1 1   Suicidal thoughts 0 0 0 0 0  PHQ-9 Score 1 13 10 13 11    GAD 7 : Generalized Anxiety Score 03/04/2020 02/04/2020 10/22/2019 07/23/2019  Nervous, Anxious, on Edge 0 1 1 2   Control/stop worrying 1 2 2 2   Worry too much - different things 1 1 2 2   Trouble relaxing 0 1 2 2   Restless 0 1 1 1   Easily annoyed or irritable 0 1 2 2   Afraid - awful might happen 0 1 1 1   Total GAD 7 Score 2 8 11 12      Past Medical History:  Diagnosis Date  . Anemia   . Bleeding    hx internal bleeding 1995 due to accident  . Blood transfusion   . Diabetes mellitus   . Hyperlipidemia   . Hypertension     Past Surgical History:  Procedure Laterality Date  . AMPUTATION Left 10/10/2018   Procedure: LEFT BELOW KNEE AMPUTATION;  Surgeon: Newt Minion, MD;  Location: [facility];  Service: Orthopedics;  Laterality: Left;  . EXPLORATORY LAPAROTOMY      Family History  Problem Relation Age of Onset  . Arthritis Mother   . Diabetes Mellitus II Father     Social History   Socioeconomic History  . Marital status: Single  Spouse name: Not on file  . Number of children: Not on file  . Years of education: Not on file  . Highest education level: Not on file  Occupational History  . Not on file  Tobacco Use  . Smoking status: Former Smoker    Quit date: 04/17/1996    Years since quitting: 23.8  . Smokeless tobacco: Never Used  Substance and Sexual Activity  . Alcohol use: Yes    Comment: occ  . Drug use: Not Currently  . Sexual activity: Yes  Other Topics Concern  . Not on file  Social History Narrative  . Not on file   Social Determinants of Health   Financial Resource Strain:   . Difficulty of Paying Living Expenses:   Food Insecurity:   . Worried About Programme researcher, broadcasting/film/video in the Last Year:   . Barista in the Last Year:    Transportation Needs:   . Freight forwarder (Medical):   Marland Kitchen Lack of Transportation (Non-Medical):   Physical Activity:   . Days of Exercise per Week:   . Minutes of Exercise per Session:   Stress:   . Feeling of Stress :   Social Connections:   . Frequency of Communication with Friends and Family:   . Frequency of Social Gatherings with Friends and Family:   . Attends Religious Services:   . Active Member of Clubs or Organizations:   . Attends Banker Meetings:   Marland Kitchen Marital Status:      Observations/Objective: Awake, alert and oriented x 3   Review of Systems  Constitutional: Negative for fever, malaise/fatigue and weight loss.  HENT: Negative.  Negative for nosebleeds.   Eyes: Negative.  Negative for blurred vision, double vision and photophobia.  Respiratory: Negative.  Negative for cough and shortness of breath.   Cardiovascular: Negative.  Negative for chest pain, palpitations and leg swelling.  Gastrointestinal: Negative.  Negative for heartburn, nausea and vomiting.  Musculoskeletal: Negative.  Negative for myalgias.  Neurological: Negative.  Negative for dizziness, focal weakness, seizures and headaches.  Psychiatric/Behavioral: Positive for depression. Negative for suicidal ideas.    Assessment and Plan: [name] was seen today for follow-up.  Diagnoses and all orders for this visit:  Major depressive disorder with single episode, in partial remission (HCC) Will continue to monitor.  He denies any thoughts of self harm.   Follow Up Instructions Return in about 2 months (around 05/04/2020) for HTN/HPL/DM.     I discussed the assessment and treatment plan with the patient. The patient was provided an opportunity to ask questions and all were answered. The patient agreed with the plan and demonstrated an understanding of the instructions.   The patient was advised to call back or seek an in-person evaluation if the symptoms worsen or if the condition  fails to improve as anticipated.  I provided 14 minutes of non-face-to-face time during this encounter including median intraservice time, reviewing previous notes, labs, imaging, medications and explaining diagnosis and management.  Claiborne Rigg, FNP-BC

## 2020-03-09 ENCOUNTER — Encounter: Payer: Medicaid Other | Admitting: Hematology and Oncology

## 2020-03-09 ENCOUNTER — Other Ambulatory Visit: Payer: Medicaid Other

## 2020-03-11 MED FILL — TRUEplus LANCETS 28G MISC: 30 days supply | Qty: 100 | Fill #1

## 2020-03-11 MED FILL — TRUE METRIX TEST STRIP: 30 days supply | Qty: 100 | Fill #3

## 2020-03-31 MED FILL — ?BASAGLAR 100 UNITS/ML KWPE: 100 | 30 days supply | Qty: 9 | Fill #1

## 2020-03-31 MED FILL — NOVOLOG FLEXPEN SYRINGE: 100 | 30 days supply | Qty: 9 | Fill #1

## 2020-03-31 MED FILL — VICTOZA 18 MG/3 ML INJECT P: 18 | 30 days supply | Qty: 9 | Fill #1

## 2020-03-31 MED FILL — TRUEPLUS 5-BEVEL PEN NEEDLE: 31G X 5 MM | 25 days supply | Qty: 100 | Fill #1

## 2020-04-03 ENCOUNTER — Telehealth: Payer: Self-pay | Admitting: Orthopedic Surgery

## 2020-04-03 ENCOUNTER — Other Ambulatory Visit: Payer: Self-pay

## 2020-04-03 DIAGNOSIS — Z89512 Acquired absence of left leg below knee: Secondary | ICD-10-CM

## 2020-04-03 NOTE — Telephone Encounter (Signed)
Order in chart for prosthetic gait training s/p left BKA

## 2020-04-03 NOTE — Telephone Encounter (Signed)
Patient called to let Dr. Lajoyce Corners know that he now has his prosthetic leg and is needing a referral for PT.  CB#[phone].  Thank you.

## 2020-04-09 ENCOUNTER — Encounter: Payer: Medicaid Other | Admitting: Physical Therapy

## 2020-05-08 ENCOUNTER — Ambulatory Visit: Payer: Medicaid Other | Admitting: Nurse Practitioner

## 2020-05-11 MED FILL — TRUE METRIX TEST STRIP: 30 days supply | Qty: 100 | Fill #4

## 2020-05-11 MED FILL — ?BASAGLAR 100 UNITS/ML KWPE: 100 | 30 days supply | Qty: 9 | Fill #2

## 2020-05-11 MED FILL — NOVOLOG FLEXPEN SYRINGE: 100 | 30 days supply | Qty: 9 | Fill #2

## 2020-05-11 MED FILL — VICTOZA 18 MG/3 ML INJECT P: 18 | 30 days supply | Qty: 9 | Fill #2

## 2020-05-14 ENCOUNTER — Encounter: Payer: 59 | Admitting: Physical Therapy

## 2020-05-19 ENCOUNTER — Encounter: Payer: 59 | Admitting: Physical Therapy

## 2020-05-25 ENCOUNTER — Ambulatory Visit (INDEPENDENT_AMBULATORY_CARE_PROVIDER_SITE_OTHER): Payer: 59 | Admitting: Physical Therapy

## 2020-05-25 ENCOUNTER — Other Ambulatory Visit: Payer: Self-pay

## 2020-05-25 ENCOUNTER — Encounter: Payer: Self-pay | Admitting: Physical Therapy

## 2020-05-25 DIAGNOSIS — R2689 Other abnormalities of gait and mobility: Secondary | ICD-10-CM | POA: Diagnosis not present

## 2020-05-25 DIAGNOSIS — R293 Abnormal posture: Secondary | ICD-10-CM

## 2020-05-25 DIAGNOSIS — R2681 Unsteadiness on feet: Secondary | ICD-10-CM | POA: Diagnosis not present

## 2020-05-25 DIAGNOSIS — R531 Weakness: Secondary | ICD-10-CM

## 2020-05-25 DIAGNOSIS — Z9181 History of falling: Secondary | ICD-10-CM

## 2020-05-25 NOTE — Therapy (Signed)
[facility] [address] [place], [place], [address] Phone: [phone]   Fax:  [phone]  Physical Therapy Evaluation  Patient Details  Name: [name] MRN: [id number] Date of Birth: 1976-07-31 Referring Provider (PT): Aldean Baker, MD   Encounter Date: 05/25/2020   PT End of Session - 05/25/20 2149    Visit Number 1    Number of Visits 9    Date for PT Re-Evaluation 07/22/20    Authorization Type [employer]    Authorization Time Period 20% co-insurance, oop $1500 not met    PT Start Time 1100    PT Stop Time 1145    PT Time Calculation (min) 45 min    Equipment Utilized During Treatment Gait belt    Activity Tolerance Patient tolerated treatment well    Behavior During Therapy [facility] for tasks assessed/performed           Past Medical History:  Diagnosis Date  . Anemia   . Bleeding    hx internal bleeding 1995 due to accident  . Blood transfusion   . Diabetes mellitus   . Hyperlipidemia   . Hypertension     Past Surgical History:  Procedure Laterality Date  . AMPUTATION Left 10/10/2018   Procedure: LEFT BELOW KNEE AMPUTATION;  Surgeon: Nadara Mustard, MD;  Location: [facility] OR;  Service: Orthopedics;  Laterality: Left;  . EXPLORATORY LAPAROTOMY      There were no vitals filed for this visit.    Subjective Assessment - 05/25/20 1105    Subjective This 44yo male was referred to PT on 04/03/2020 by Aldean Baker,  MD with Left Below Knee Amputaion. He underwent a Left Transtibial Amputation on 10/10/2018. He recieved prosthesis on 03/27/2020 [employer], CPO.    Pertinent History Left TTA, DM, HTN, HLD, depression, Neuropathy, pancreatitis    Patient Stated Goals To use prosthesis to be active in community, would like to return to work    Currently in Pain? Yes    Pain Score 8     Pain Location Knee    Pain Orientation Left;Right    Pain Descriptors / Indicators Aching;Burning    Pain Type Chronic pain;Phantom  pain;Neuropathic pain    Pain Onset More than a month ago    Pain Frequency Intermittent    Aggravating Factors  walking or standing too much    Pain Relieving Factors sitting down, medications              [facility] PT Assessment - 05/25/20 1100      Assessment   Medical Diagnosis left Transtibial Amputation    Referring Provider (PT) Aldean Baker, MD    Onset Date/Surgical Date 03/27/20   prosthesis delivery   Hand Dominance Right    Prior Therapy none for prosthetic training      Precautions   Precautions Fall      Balance Screen   Has the patient fallen in the past 6 months No    Has the patient had a decrease in activity level because of a fear of falling?  No    Is the patient reluctant to leave their home because of a fear of falling?  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    Leisure cut hair      Posture/Postural Control   Posture/Postural Control Postural limitations    Postural Limitations Rounded Shoulders;Forward head;Weight shift right      ROM / Strength   AROM / PROM / Strength Strength;PROM      PROM   Overall PROM  Within functional limits for tasks performed      Strength   Overall Strength Deficits    Overall Strength Comments BUEs grossly 5/5    Right Hip Flexion 4/5    Right Hip Extension 3+/5   gross functional test standing    Right Hip ABduction 4-/5   gross functional test standing   Left Hip Flexion 4/5    Left Hip Extension 3+/5   gross functional test standing   Left Hip ABduction 4-/5   gross functional test standing   Right/Left Knee Right;Left    Right Knee Flexion 4/5   gross functional test standing   Right Knee Extension 4/5    Left Knee Flexion 4/5   gross functional test standing   Left Knee Extension 4/5    Right Ankle Dorsiflexion 4/5    Right Ankle Plantar Flexion 3-/5   gross functional test standing     Transfers   Transfers Sit to Stand;Stand to Sit    Sit to Stand 5: Supervision;Without upper extremity assist;From chair/3-in-1    Stand to Sit 5: Supervision;Without upper extremity assist;To chair/3-in-1      Ambulation/Gait   Ambulation/Gait Yes    Ambulation/Gait Assistance 4: Min guard    Ambulation Distance (Feet) 300 Feet    Assistive device Prosthesis;None   arrived with cane, PT assessed w/o device except prosthesis   Gait Pattern Step-through pattern;Decreased arm swing - left;Decreased step length - right;Decreased stance time - left;Decreased hip/knee flexion - left;Decreased weight shift to left;Right steppage;Left flexed knee in stance;Right flexed knee in stance;Antalgic;Lateral hip instability;Trunk flexed;Abducted - left    Ambulation Surface Level;Indoor    Gait velocity 2.86 ft/sec    Stairs Yes    Stairs Assistance 5: Supervision    Stair Management Technique One rail Left;Alternating pattern;Forwards;With cane    Number of Stairs 6    Height of Stairs 6      Standardized Balance Assessment   Standardized Balance Assessment Berg Balance Test;Timed Up and Go Test      Berg Balance Test   Sit to Stand Able to stand without using hands and stabilize independently    Standing Unsupported Able to stand safely 2 minutes    Sitting with Back Unsupported but Feet Supported on Floor or Stool Able to sit safely and securely 2 minutes    Stand to Sit Sits safely with minimal  use of hands    Transfers Able to transfer safely, minor use of hands    Standing Unsupported with Eyes Closed Able to stand 10 seconds with supervision    Standing Unsupported with Feet Together Able to place feet together independently but unable to hold for 30 seconds    From Standing, Reach Forward with Outstretched Arm  Can reach forward >5 cm safely (2")    From Standing Position, Pick up Object from [place] to pick up shoe, needs supervision    From Standing Position, Turn to Look Behind Over each Shoulder Turn sideways only but maintains balance    Turn 360 Degrees Needs close supervision or verbal cueing    Standing Unsupported, Alternately Place Feet on Step/Stool Able to complete 4 steps without aid or supervision    Standing Unsupported, One Foot in [place] to take small step independently and hold 30 seconds    Standing on One Leg Tries to lift leg/unable to hold 3 seconds but remains standing independently    Total Score 38      Timed Up and Go Test   Normal TUG (seconds) 9.59    Cognitive TUG (seconds) 13.25      Functional Gait  Assessment   Gait assessed  Yes    Gait Level Surface Walks 20 ft in less than 7 sec but greater than 5.5 sec, uses assistive device, slower speed, mild gait deviations, or deviates 6-10 in outside of the 12 in walkway width.    Change in Gait Speed Able to change speed, demonstrates mild gait deviations, deviates 6-10 in outside of the 12 in walkway width, or no gait deviations, unable to achieve a major change in velocity, or uses a change in velocity, or uses an assistive device.    Gait with Horizontal Head Turns Performs head turns smoothly with slight change in gait velocity (eg, minor disruption to smooth gait path), deviates 6-10 in outside 12 in walkway width, or uses an assistive device.    Gait with Vertical Head Turns Performs task with slight change in gait velocity (eg, minor disruption to smooth gait path), deviates 6 - 10 in outside  12 in walkway width or uses assistive device    Gait and Pivot Turn Pivot turns safely in greater than 3 sec and stops with no loss of balance, or pivot turns safely within 3 sec and stops with mild imbalance, requires small steps to catch balance.    Step Over Obstacle Is able to step over one shoe box (4.5 in total height) but must slow down and adjust steps to clear box safely. May require verbal cueing.    Gait with Narrow Base of Support Ambulates less than 4 steps heel to toe or cannot perform without assistance.    Gait with Eyes Closed Walks 20 ft, slow speed, abnormal gait pattern, evidence for imbalance, deviates 10-15 in outside 12 in walkway width. Requires more than 9 sec to ambulate 20 ft.    Ambulating Backwards Walks 20 ft, slow speed, abnormal gait pattern, evidence for imbalance, deviates 10-15 in outside 12 in walkway width.    Steps Alternating feet, must use rail.    Total Score 15           Prosthetics Assessment - 05/25/20 1100      Prosthetics   Prosthetic Care Dependent with Skin check;Residual limb care;Care of non-amputated limb;Prosthetic cleaning;Ply sock cleaning;Correct ply sock adjustment;Proper wear schedule/adjustment;Proper weight-bearing schedule/adjustment    Donning prosthesis  Supervision    Doffing prosthesis  Supervision    Current prosthetic wear tolerance (days/week)  daily for last 2 weeks    Current prosthetic wear tolerance (#hours/day)  reports 15 minutes if not going out of house & up to 3 hrs when going out of house    Current prosthetic weight-bearing tolerance (hours/day)  Patient reports no  discomfort in residual limb with standing & gait activities during evaluation.     Edema non-pitting edema    Residual limb condition  no open areas, cylinderical shape, normal color, temperature. No hair growth.      Prosthesis Description silicon liner with shuttle pin lock, total contact socket                     Objective measurements  completed on examination: See above findings.       [facility] Adult PT Treatment/Exercise - 05/25/20 1100      Prosthetics   Prosthetic Care Comments  wear daily 3 hrs 2x/day with plan to increase q5 days if no issues.      Education Provided Skin check;Residual limb care;Prosthetic cleaning;Correct ply sock adjustment;Proper Donning;Proper wear schedule/adjustment    Person(s) Educated Patient    Education Method Explanation;Demonstration;Tactile cues;Verbal cues    Education Method Verbalized understanding;Verbal cues required;Needs further instruction                    PT Short Term Goals - 05/25/20 2158      PT SHORT TERM GOAL #1   Title Patient demonstrates proper donning of prosthesis and verbalizes proper cleaning.  (All STGs Target Date: 06/24/2020)    Time 4    Period Weeks    Status New    Target Date 06/24/20      PT SHORT TERM GOAL #2   Title Patient tolerates prosthesis wear daily >10hrs total / day without skin issues.    Time 4    Period Weeks    Status New    Target Date 06/24/20      PT SHORT TERM GOAL #3   Title Patient ambulates 300' with prosthesis only with supervision.    Time 4    Period Weeks    Status New    Target Date 06/24/20      PT SHORT TERM GOAL #4   Title Patient negotiates ramps & curbs with prosthesis only with supervision.    Time 4    Period Weeks    Status New    Target Date 06/24/20      PT SHORT TERM GOAL #5   Title Patient demonstrates understanding of initial HEP.    Time 4    Period Weeks    Status New    Target Date 06/24/20             PT Long Term Goals - 05/25/20 2152      PT LONG TERM GOAL #1   Title Patient verbalizes & demonstrates understanding of proper prosthetic care to enable safe utilization of prosthesis. (All LTGs Target Date: 07/22/2020)    Time 8    Period Weeks    Status New    Target Date 07/22/20      PT LONG TERM GOAL #2   Title Patient tolerates wear of prosthesis >90% of awake  hours without skin or limb pain issues to enable function throughout his day.    Time 8    Period Weeks    Status New    Target Date 07/22/20      PT LONG TERM GOAL #3   Title Berg Balance >45/56 to indicate low fall risk.    Time 8    Period Weeks    Status New    Target Date 07/22/20      PT LONG TERM GOAL #4   Title Functional Gait Assessment with prosthesis  only >/= 19/30 to indicate lower fall risk.    Time 8    Period Weeks    Status New    Target Date 07/22/20      PT LONG TERM GOAL #5   Title Patient ambulates 500' with prosthesis only modified independent.    Time 8    Period Weeks    Status New    Target Date 07/22/20      Additional Long Term Goals   Additional Long Term Goals Yes      PT LONG TERM GOAL #6   Title Patient negotiates ramps, curbs and stairs with single rail with prosthesis only modified independent.    Time 8    Period Weeks    Status New    Target Date 07/22/20                  Plan - 05/25/20 2204    Clinical Impression Statement Patient underwent a left Transtibial Amputation and recieved his first prosthesis on 03/27/2020.  He is dependent in proper prosthetic care which increases risk of skin issues with use. Patient has limited prosthesis wear varying from 15 minutes to 3 hours which limits function & safety.  Berg Balance 38/56 indicates high fall risk.  Functional Gait Assessment 15/30 indicates high fall risk. Patient would benefit from skilled PT to improve safety & function.    Personal Factors and Comorbidities Comorbidity 3+;Fitness    Comorbidities Left TTA, DM, HTN, HLD, depression, Neuropathy, pancreatitis    Examination-Activity Limitations Caring for Others;Lift;Locomotion Level;Squat;Stairs;Stand;Transfers    Examination-Participation Restrictions Community Activity    Stability/Clinical Decision Making Evolving/Moderate complexity    Clinical Decision Making Moderate    Rehab Potential Good    PT Frequency 1x / week     PT Duration 8 weeks    PT Treatment/Interventions ADLs/Self Care Home Management;DME Instruction;Gait training;Stair training;Functional mobility training;Therapeutic activities;Therapeutic exercise;Balance training;Neuromuscular re-education;Patient/family education;Prosthetic Training;Vestibular    PT Next Visit Plan review prosthetic training,  instruct in HEP           Patient will benefit from skilled therapeutic intervention in order to improve the following deficits and impairments:  Abnormal gait, Decreased activity tolerance, Decreased balance, Decreased cognition, Decreased endurance, Decreased knowledge of use of DME, Decreased mobility, Decreased strength, Prosthetic Dependency, Postural dysfunction, Pain  Visit Diagnosis: Other abnormalities of gait and mobility  Unsteadiness on feet  Abnormal posture  Weakness generalized  History of fall     Problem List Patient Active Problem List   Diagnosis Date Noted  . Current mild episode of major depressive disorder without prior episode ([place]) 12/05/2018  . Left below-knee amputee ([place]) 10/17/2018  . Diabetes mellitus ([place]) 07/05/2018  . Elevated MCV 07/05/2018  . Transaminitis 04/18/2012  . Hyponatremia 04/18/2012  . Anemia 04/18/2012  . Alcohol abuse 04/18/2012  . Hypokalemia 04/18/2012  . Pancytopenia 04/18/2012  . Neuropathy 04/18/2012  . DKA 08/16/2010  . PANCREATITIS 08/16/2010    Jamey Reas PT, DPT 05/25/2020, 10:14 PM  [facility] [address] [place], [place], [address] Phone: [phone]   Fax:  [phone]  Name: Zanden Colver MRN: [id number] Date of Birth: [birth year]

## 2020-05-27 ENCOUNTER — Emergency Department (HOSPITAL_COMMUNITY): Payer: 59

## 2020-05-27 ENCOUNTER — Other Ambulatory Visit: Payer: Self-pay

## 2020-05-27 ENCOUNTER — Telehealth: Payer: Self-pay | Admitting: Nurse Practitioner

## 2020-05-27 ENCOUNTER — Emergency Department (HOSPITAL_COMMUNITY)
Admission: EM | Admit: 2020-05-27 | Discharge: 2020-05-27 | Disposition: A | Discharge status: Home / Self Care | Payer: 59 | Attending: Emergency Medicine | Admitting: Emergency Medicine

## 2020-05-27 DIAGNOSIS — S4992XA Unspecified injury of left shoulder and upper arm, initial encounter: Secondary | ICD-10-CM | POA: Diagnosis present

## 2020-05-27 DIAGNOSIS — Z87891 Personal history of nicotine dependence: Secondary | ICD-10-CM | POA: Diagnosis not present

## 2020-05-27 DIAGNOSIS — I10 Essential (primary) hypertension: Secondary | ICD-10-CM | POA: Insufficient documentation

## 2020-05-27 DIAGNOSIS — Y93E1 Activity, personal bathing and showering: Secondary | ICD-10-CM | POA: Diagnosis not present

## 2020-05-27 DIAGNOSIS — S01511A Laceration without foreign body of lip, initial encounter: Secondary | ICD-10-CM | POA: Insufficient documentation

## 2020-05-27 DIAGNOSIS — Z794 Long term (current) use of insulin: Secondary | ICD-10-CM | POA: Insufficient documentation

## 2020-05-27 DIAGNOSIS — S42352A Displaced comminuted fracture of shaft of humerus, left arm, initial encounter for closed fracture: Secondary | ICD-10-CM | POA: Diagnosis not present

## 2020-05-27 DIAGNOSIS — E119 Type 2 diabetes mellitus without complications: Secondary | ICD-10-CM | POA: Insufficient documentation

## 2020-05-27 DIAGNOSIS — F1099 Alcohol use, unspecified with unspecified alcohol-induced disorder: Secondary | ICD-10-CM | POA: Diagnosis not present

## 2020-05-27 DIAGNOSIS — Z89512 Acquired absence of left leg below knee: Secondary | ICD-10-CM | POA: Insufficient documentation

## 2020-05-27 DIAGNOSIS — Y999 Unspecified external cause status: Secondary | ICD-10-CM | POA: Diagnosis not present

## 2020-05-27 DIAGNOSIS — W182XXA Fall in (into) shower or empty bathtub, initial encounter: Secondary | ICD-10-CM | POA: Diagnosis not present

## 2020-05-27 DIAGNOSIS — Z79899 Other long term (current) drug therapy: Secondary | ICD-10-CM | POA: Insufficient documentation

## 2020-05-27 DIAGNOSIS — Y92002 Bathroom of unspecified non-institutional (private) residence single-family (private) house as the place of occurrence of the external cause: Secondary | ICD-10-CM | POA: Diagnosis not present

## 2020-05-27 DIAGNOSIS — W19XXXA Unspecified fall, initial encounter: Secondary | ICD-10-CM

## 2020-05-27 MED ORDER — FENTANYL CITRATE (PF) 100 MCG/2ML IJ SOLN
100.0000 ug | Freq: Once | INTRAMUSCULAR | Status: AC
  Administered 2020-05-27: 100 ug via INTRAVENOUS
  Filled 2020-05-27: qty 2

## 2020-05-27 MED ORDER — KETOROLAC TROMETHAMINE 15 MG/ML IJ SOLN
15.0000 mg | Freq: Once | INTRAMUSCULAR | Status: AC
  Administered 2020-05-27: 15 mg via INTRAVENOUS
  Filled 2020-05-27: qty 1

## 2020-05-27 MED ORDER — NALOXONE HCL 2 MG/2ML IJ SOSY
PREFILLED_SYRINGE | INTRAMUSCULAR | Status: AC
  Filled 2020-05-27: qty 2

## 2020-05-27 MED ORDER — LIDOCAINE-EPINEPHRINE-TETRACAINE (LET) TOPICAL GEL
3.0000 mL | Freq: Once | TOPICAL | Status: AC
  Administered 2020-05-27: 3 mL via TOPICAL
  Filled 2020-05-27: qty 3

## 2020-05-27 MED ORDER — METHOCARBAMOL 500 MG PO TABS
500.0000 mg | ORAL_TABLET | Freq: Once | ORAL | Status: AC
  Administered 2020-05-27: 500 mg via ORAL
  Filled 2020-05-27: qty 1

## 2020-05-27 MED ORDER — HYDROCODONE-ACETAMINOPHEN 5-325 MG PO TABS: 2.0000 | ORAL_TABLET | Freq: Four times a day (QID) | ORAL | 0 refills | Status: DC | PRN

## 2020-05-27 NOTE — ED Triage Notes (Addendum)
Pt coming by EMS after having a fall when getting out of the shower this evening. Pt is a left BKA. Pt endorses having 4 12 oz. Beers this evening. Obvious deformity to left upper arm. Hx of DM. 100 mcg of Fentanyl given by EMS

## 2020-05-27 NOTE — ED Notes (Signed)
Pt transported to radiology.

## 2020-05-27 NOTE — Discharge Instructions (Addendum)
Call doctor Duda's office tomorrow to set up a follow up appointment for further planning regarding your arm.  Continue taking home medications as prescribed.  Take ibuprofen 3 times a day with meals.  Do not take other anti-inflammatories at the same time (Advil, Motrin, naproxen, Aleve). You may supplement with Tylenol if you need further pain control. Use Norco as needed for severe breakthrough pain.  Have caution, this may make you tired or groggy.  Do not drive or operate heavy machinery while taking this medicine. Do not drink alcohol while taking pain medicines. Use ice packs for pain and swelling.  Return to the ER with any new, worsening, or concerning symptoms.

## 2020-05-27 NOTE — Telephone Encounter (Signed)
Called the Pt since he left a msg for me to send him a new application, I LVM inform him that I will be mailing the application today

## 2020-05-27 NOTE — ED Provider Notes (Signed)
[facility] EMERGENCY DEPARTMENT Provider Note   CSN: [id number] Arrival date & time: 05/27/20  2007     History No chief complaint on file.   [name] is a 44 y.o. male presenting for evaluation of left arm pain after a fall.  Patient states he was getting out of the shower when he slipped, and fell onto his left side hitting the toilet on his way down.  He reports pain and injury of his left arm/shoulder.  He denies hitting his head or loss of consciousness.  He denies pain elsewhere.  This happened just prior to arrival.  He denies headache, neck pain, chest pain, shortness breath, nausea, vomit, abdominal pain. 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  He reports "significant" alcohol use tonight.  He is not on blood thinners.  He denies tobacco or drug use.  He reports a history of diabetes for which he takes medication, denies other medical problems.  Additional history obtained from chart review.  Patient with a history of diabetes, hypertension, alcohol abuse, anemia, left BKA  HPI     Past Medical History:  Diagnosis Date  . Anemia   . Bleeding    hx internal bleeding 1995 due to accident  . Blood transfusion   . Diabetes mellitus   . Hyperlipidemia   . Hypertension     Patient Active Problem List   Diagnosis Date Noted  . Current mild episode of major depressive disorder without prior episode ([place]) 12/05/2018  . Left below-knee amputee ([place]) 10/17/2018  . Diabetes mellitus ([place]) 07/05/2018  . Elevated MCV 07/05/2018  . Transaminitis 04/18/2012  . Hyponatremia 04/18/2012  . Anemia 04/18/2012  . Alcohol abuse 04/18/2012  . Hypokalemia 04/18/2012  . Pancytopenia 04/18/2012  . Neuropathy 04/18/2012  . DKA 08/16/2010  . PANCREATITIS 08/16/2010    Past Surgical History:  Procedure Laterality Date  . AMPUTATION Left 10/10/2018   Procedure: LEFT BELOW KNEE AMPUTATION;  Surgeon: Newt Minion, MD;  Location: [place];  Service: Orthopedics;  Laterality:  Left;  . EXPLORATORY LAPAROTOMY         Family History  Problem Relation Age of Onset  . Arthritis Mother   . Diabetes Mellitus II Father     Social History   Tobacco Use  . Smoking status: Former Smoker    Quit date: 04/17/1996    Years since quitting: 24.1  . Smokeless tobacco: Never Used  Vaping Use  . Vaping Use: Never used  Substance Use Topics  . Alcohol use: Yes    Comment: occ  . Drug use: Not Currently    Home Medications Prior to Admission medications   Medication Sig Start Date End Date Taking? Authorizing Provider  acetaminophen (TYLENOL) 325 MG tablet Take 1-2 tablets (325-650 mg total) by mouth every 6 (six) hours as needed for mild pain (pain score 1-3 or temp > 100.5). 10/12/18  Yes Elgergawy, Silver Huguenin, MD  atorvastatin (LIPITOR) 20 MG tablet Take 1 tablet (20 mg total) by mouth daily. 02/04/20  Yes Gildardo Pounds, NP  Blood Glucose Monitoring Suppl (TRUE METRIX METER) w/Device KIT Use as instructed 04/19/19  Yes Gildardo Pounds, NP  insulin aspart (NOVOLOG FLEXPEN) 100 UNIT/ML FlexPen Inject 10 Units into the skin 3 (three) times daily with meals. 02/04/20 05/27/20 Yes Gildardo Pounds, NP  Insulin Glargine (BASAGLAR KWIKPEN) 100 UNIT/ML Inject 0.3 mLs (30 Units total) into the skin at bedtime. Patient taking differently: Inject 30 Units into the skin  daily.  02/04/20  Yes Claiborne Rigg, NP  Insulin Pen Needle (B-D UF III MINI PEN NEEDLES) 31G X 5 MM MISC Use as instructed. Inject into the skin 4 times per day 02/04/20  Yes Claiborne Rigg, NP  liraglutide (VICTOZA) 18 MG/3ML SOPN Inject 0.3 mLs (1.8 mg total) into the skin daily. 02/04/20 05/27/20 Yes Claiborne Rigg, NP  losartan (COZAAR) 50 MG tablet Take 1 tablet (50 mg total) by mouth daily. 02/04/20  Yes Claiborne Rigg, NP  TRUEplus Lancets 28G MISC Use as instructed. Monitor blood glucose levels three times per day. 10/22/19  Yes Claiborne Rigg, NP  escitalopram (LEXAPRO) 10 MG tablet Take 1 tablet (10 mg  total) by mouth daily. Patient not taking: Reported on 03/04/2020 02/04/20 03/05/20  Claiborne Rigg, NP  glucose blood (TRUE METRIX BLOOD GLUCOSE TEST) test strip Use as instructed. Monitor blood glucose levels three times per day. 02/04/20   Claiborne Rigg, NP  HYDROcodone-acetaminophen (NORCO/VICODIN) 5-325 MG tablet Take 2 tablets by mouth every 6 (six) hours as needed for severe pain. 05/27/20   Kordae Buonocore, PA-C    Allergies    Patient has no known allergies.  Review of Systems   Review of Systems  Musculoskeletal: Positive for arthralgias.  Hematological: Does not bruise/bleed easily.  All other systems reviewed and are negative.   Physical Exam Updated Vital Signs BP 128/90   Pulse 91   Temp 98.5 F (36.9 C) (Oral)   Resp 18   Ht 6\' 1"  (1.854 m)   Wt 117.9 kg   SpO2 100%   BMI 34.30 kg/m   Physical Exam Vitals and nursing note reviewed.  Constitutional:      General: He is not in acute distress.    Appearance: He is well-developed.     Comments: Appears uncomfortable due to pain. Otherwise nontoxic  HENT:     Head: Normocephalic and atraumatic.     Mouth/Throat:      Comments: 0.5 cm y-shaped lip lac at the corner of the R upper lip not involving the [place] border.   No hemotympanum or nasal septal hematoma.  Eyes:     Extraocular Movements: Extraocular movements intact.     Conjunctiva/sclera: Conjunctivae normal.     Pupils: Pupils are equal, round, and reactive to light.  Cardiovascular:     Rate and Rhythm: Normal rate and regular rhythm.     Pulses: Normal pulses.  Pulmonary:     Effort: Pulmonary effort is normal. No respiratory distress.     Breath sounds: Normal breath sounds. No wheezing.     Comments: No ttp of the chest wall Chest:     Chest wall: No tenderness.  Abdominal:     General: There is no distension.     Palpations: Abdomen is soft. There is no mass.     Tenderness: There is no abdominal tenderness. There is no guarding or  rebound.     Comments: No ttp of the abd  Musculoskeletal:        General: Swelling, tenderness and deformity present.     Cervical back: Normal range of motion and neck supple.     Comments: Obvious deformity of the left humerus. Radial pulses 2+ bilaterally.  Full active range of motion of the left hand at the wrist without weakness or numbness.  Good distal sensation and cap refill. No tenderness palpation of the right arm, right leg, or left upper leg.  Patient with a left  BKA.  Pelvis stable and intact.  Skin:    General: Skin is warm and dry.     Capillary Refill: Capillary refill takes less than 2 seconds.  Neurological:     Mental Status: He is alert and oriented to person, place, and time.     ED Results / Procedures / Treatments   Labs (all labs ordered are listed, but only abnormal results are displayed) Labs Reviewed  CBG MONITORING, ED    EKG None  Radiology DG Chest 1 View  Result Date: 05/27/2020 CLINICAL DATA:  Pain status post fall EXAM: CHEST  1 VIEW COMPARISON:  Apr 17, 2012 FINDINGS: The heart size and mediastinal contours are within normal limits. Both lungs are clear. The visualized skeletal structures are unremarkable. IMPRESSION: No active disease. Electronically Signed   By: Constance Holster M.D.   On: 05/27/2020 21:04   CT Head Wo Contrast  Result Date: 05/27/2020 CLINICAL DATA:  [place] getting out of shower this evening EXAM: CT HEAD WITHOUT CONTRAST CT CERVICAL SPINE WITHOUT CONTRAST TECHNIQUE: Multidetector CT imaging of the head and cervical spine was performed following the standard protocol without intravenous contrast. Multiplanar CT image reconstructions of the cervical spine were also generated. COMPARISON:  04/18/2012 FINDINGS: CT HEAD FINDINGS Brain: No acute infarct or hemorrhage. Lateral ventricles and midline structures are unremarkable. No acute extra-axial fluid collections. No mass effect. Vascular: No hyperdense vessel or unexpected  calcification. Skull: Normal. Negative for fracture or focal lesion. Sinuses/Orbits: Mild mucoperiosteal thickening of the bilateral maxillary sinuses. Polypoid thickening is seen within the right sphenoid sinus. Other: None. CT CERVICAL SPINE FINDINGS Alignment: Alignment is anatomic. Skull base and vertebrae: No acute displaced fractures. Soft tissues and spinal canal: No prevertebral fluid or swelling. No visible canal hematoma. Disc levels: Left predominant disc osteophyte complex and uncovertebral hypertrophy at C2/C3 result in left-sided neural foraminal encroachment. Remaining levels are unremarkable. Upper chest: Airway is patent.  Lung apices are clear. Other: Reconstructed images demonstrate no additional findings. IMPRESSION: 1. No acute intracranial process. 2. No acute cervical spine fracture. Electronically Signed   By: Randa Ngo M.D.   On: 05/27/2020 21:24   CT Cervical Spine Wo Contrast  Result Date: 05/27/2020 CLINICAL DATA:  [place] getting out of shower this evening EXAM: CT HEAD WITHOUT CONTRAST CT CERVICAL SPINE WITHOUT CONTRAST TECHNIQUE: Multidetector CT imaging of the head and cervical spine was performed following the standard protocol without intravenous contrast. Multiplanar CT image reconstructions of the cervical spine were also generated. COMPARISON:  04/18/2012 FINDINGS: CT HEAD FINDINGS Brain: No acute infarct or hemorrhage. Lateral ventricles and midline structures are unremarkable. No acute extra-axial fluid collections. No mass effect. Vascular: No hyperdense vessel or unexpected calcification. Skull: Normal. Negative for fracture or focal lesion. Sinuses/Orbits: Mild mucoperiosteal thickening of the bilateral maxillary sinuses. Polypoid thickening is seen within the right sphenoid sinus. Other: None. CT CERVICAL SPINE FINDINGS Alignment: Alignment is anatomic. Skull base and vertebrae: No acute displaced fractures. Soft tissues and spinal canal: No prevertebral fluid or  swelling. No visible canal hematoma. Disc levels: Left predominant disc osteophyte complex and uncovertebral hypertrophy at C2/C3 result in left-sided neural foraminal encroachment. Remaining levels are unremarkable. Upper chest: Airway is patent.  Lung apices are clear. Other: Reconstructed images demonstrate no additional findings. IMPRESSION: 1. No acute intracranial process. 2. No acute cervical spine fracture. Electronically Signed   By: Randa Ngo M.D.   On: 05/27/2020 21:24   DG Shoulder Left  Result Date: 05/27/2020 CLINICAL DATA:  Pain EXAM: LEFT SHOULDER - 2+ VIEW COMPARISON:  None. FINDINGS: There is an acute comminuted fracture of the mid left humeral diaphysis with a butterfly fragment measuring approximately 10 cm. There is surrounding soft tissue swelling. There is no definite evidence for a glenohumeral dislocation. IMPRESSION: Acute comminuted displaced fracture of the left humeral diaphysis. Electronically Signed   By: Constance Holster M.D.   On: 05/27/2020 21:02   DG Humerus Left  Result Date: 05/27/2020 CLINICAL DATA:  Pain EXAM: LEFT HUMERUS - 2+ VIEW COMPARISON:  None. FINDINGS: There is an acute comminuted displaced fracture of the left humeral diaphysis with a 10 cm fracture fragment. There is surrounding soft tissue swelling. There is no definite evidence for a glenohumeral dislocation. IMPRESSION: Acute comminuted displaced fracture of the left humeral diaphysis. Electronically Signed   By: Constance Holster M.D.   On: 05/27/2020 21:03    Procedures .Marland KitchenLaceration Repair  Date/Time: 05/27/2020 10:48 PM Performed by: Franchot Heidelberg, PA-C Authorized by: Franchot Heidelberg, PA-C   Consent:    Consent obtained:  Verbal   Consent given by:  Patient   Risks discussed:  Infection, need for additional repair, nerve damage, poor wound healing, poor cosmetic result and pain Anesthesia (see MAR for exact dosages):    Anesthesia method:  Topical application   Topical  anesthetic:  LET Laceration details:    Location:  Lip   Lip location:  Upper exterior lip   Length (cm):  0.5   Depth (mm):  3 Repair type:    Repair type:  Simple Pre-procedure details:    Preparation:  Patient was prepped and draped in usual sterile fashion Exploration:    Hemostasis achieved with:  Direct pressure   Wound exploration: wound explored through full range of motion and entire depth of wound probed and visualized   Skin repair:    Repair method:  Sutures   Suture size:  5-0   Suture material:  Fast-absorbing gut   Suture technique:  Simple interrupted   Number of sutures:  2 Approximation:    Approximation:  Close Post-procedure details:    Dressing:  Open (no dressing)   Patient tolerance of procedure:  Tolerated well, no immediate complications   (including critical care time)  Medications Ordered in ED Medications  methocarbamol (ROBAXIN) tablet 500 mg (has no administration in time range)  fentaNYL (SUBLIMAZE) injection 100 mcg (100 mcg Intravenous Given 05/27/20 2130)  lidocaine-EPINEPHrine-tetracaine (LET) topical gel (3 mLs Topical Given 05/27/20 2144)  ketorolac (TORADOL) 15 MG/ML injection 15 mg (15 mg Intravenous Given 05/27/20 2252)    ED Course  I have reviewed the triage vital signs and the nursing notes.  Pertinent labs & imaging results that were available during my care of the patient were reviewed by me and considered in my medical decision making (see chart for details).    MDM Rules/Calculators/A&P                          Patient presenting for evaluation of left arm pain after a fall.  On exam, patient appears nontoxic.  He does have a deformity of his left arm, he is neurovascularly intact.  He is a small laceration of his lip, but otherwise no obvious trauma.  He has been drinking tonight, as such will obtain CT head and neck.  Will obtain x-rays of his shoulder, arm, and chest.  He is not having any abdominal pain or pelvic pain, I do  not  believe he needs a CT abdomen pelvis or pelvic imaging.  X-rays viewed interpreted by me, shows comminuted displaced fracture of the humerus.  Will consult with Dr. Sharol Given, patient's orthopedic doctor.  CT head and neck negative for acute findings.  Discussed with Dr. Sharol Given, who recommends sugar tong and follow-up in the office tomorrow.  Lip laceration repaired as described above. On recheck after splint placement, pt remains neuro intact with good radial pulses. Discussed findings and plan with patient and fianc.  Discussed importance of f/u with ortho. At this time, pt appears safe for d/c. Return precautions given. Pt states he understands and agrees to plan.   Final Clinical Impression(s) / ED Diagnoses Final diagnoses:  Closed displaced comminuted fracture of shaft of left humerus, initial encounter  Fall, initial encounter    Rx / DC Orders ED Discharge Orders         Ordered    HYDROcodone-acetaminophen (NORCO/VICODIN) 5-325 MG tablet  Every 6 hours PRN     Discontinue  Reprint     05/27/20 [place], Daaiel Starlin, PA-C 05/27/20 [place], [place], DO 05/27/20 2337

## 2020-05-27 NOTE — Progress Notes (Signed)
Orthopedic Tech Progress Note Patient Details:  [name] 1976-07-21 [id number]  Ortho Devices Type of Ortho Device: Coapt Ortho Device/Splint Location: LUE Ortho Device/Splint Interventions: Application   Post Interventions Patient Tolerated: Well Instructions Provided: Care of device   Rafaela Dinius E Aniesha Haughn 05/27/2020, 11:36 PM

## 2020-05-27 NOTE — ED Notes (Signed)
Patient oxygen decreased to 43% with good pleth. Pt stimulated with painful stimuli and placed oxygen via [place] @ 4lpm  - pt eventually awoke.

## 2020-05-27 NOTE — ED Notes (Signed)
Ortho at bedside.

## 2020-05-28 ENCOUNTER — Ambulatory Visit (INDEPENDENT_AMBULATORY_CARE_PROVIDER_SITE_OTHER): Payer: 59 | Admitting: Physician Assistant

## 2020-05-28 ENCOUNTER — Other Ambulatory Visit (HOSPITAL_COMMUNITY)
Admission: RE | Admit: 2020-05-28 | Discharge: 2020-05-28 | Disposition: A | Discharge status: Home / Self Care | Payer: 59 | Source: Ambulatory Visit | Attending: Orthopedic Surgery | Admitting: Orthopedic Surgery

## 2020-05-28 ENCOUNTER — Encounter: Payer: Self-pay | Admitting: Orthopedic Surgery

## 2020-05-28 ENCOUNTER — Other Ambulatory Visit: Payer: Self-pay | Admitting: Physician Assistant

## 2020-05-28 ENCOUNTER — Encounter (HOSPITAL_COMMUNITY): Payer: Self-pay | Admitting: Orthopedic Surgery

## 2020-05-28 ENCOUNTER — Other Ambulatory Visit: Payer: Self-pay

## 2020-05-28 VITALS — Ht 73.0 in | Wt 260.0 lb

## 2020-05-28 DIAGNOSIS — Z20822 Contact with and (suspected) exposure to covid-19: Secondary | ICD-10-CM | POA: Diagnosis not present

## 2020-05-28 DIAGNOSIS — Z01812 Encounter for preprocedural laboratory examination: Secondary | ICD-10-CM | POA: Insufficient documentation

## 2020-05-28 DIAGNOSIS — S42302A Unspecified fracture of shaft of humerus, left arm, initial encounter for closed fracture: Secondary | ICD-10-CM | POA: Diagnosis not present

## 2020-05-28 LAB — SARS CORONAVIRUS 2 (TAT 6-24 HRS): SARS Coronavirus 2: NEGATIVE
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
## 2020-05-28 NOTE — Progress Notes (Signed)
Spoke with pt's fiance, Hoyt Koch (after getting pt's permission) for pre-op call. She states pt does not have a cardiac history, but is treated for HTN and Type 2 Diabetes. Pt's last A1C was 8.9 on 02/04/20. She states pt's fasting blood sugar is usually between 70-130. Instructed Rachael to have pt check his blood sugar in the AM when he gets up and every 2 hours until he leaves for the hospital. If blood sugar is 70 or below, treat with 1/2 cup of clear juice (apple or cranberry) and recheck blood sugar 15 minutes after drinking juice. If blood sugar continues to be 70 or below, call the Short Stay department and ask to speak to a nurse. Rachael voiced understanding.  Covid test done today, pt is in quarantine and understands that he stays in quarantine until he comes to the hospital tomorrow.  ERAS Protocol ordered. Instructed Rachael to have pt not eat any food after midnight tonight, but he may have clear liquids (low sugar/diet) until 6:00 AM. Instructed her to have him drink 10 ounces of water just prior to 6 AM and that will be his last liquid prior to surgery. Rachael voiced understanding.

## 2020-05-29 ENCOUNTER — Encounter (HOSPITAL_COMMUNITY)
Admission: RE | Disposition: A | Discharge status: Home / Self Care | Payer: Self-pay | Source: Home / Self Care | Attending: Orthopedic Surgery

## 2020-05-29 ENCOUNTER — Encounter (HOSPITAL_COMMUNITY): Payer: Self-pay | Admitting: Orthopedic Surgery

## 2020-05-29 ENCOUNTER — Ambulatory Visit (HOSPITAL_COMMUNITY): Payer: 59 | Admitting: Certified Registered"

## 2020-05-29 ENCOUNTER — Other Ambulatory Visit: Payer: Self-pay

## 2020-05-29 ENCOUNTER — Ambulatory Visit (HOSPITAL_COMMUNITY): Payer: 59

## 2020-05-29 ENCOUNTER — Ambulatory Visit (HOSPITAL_COMMUNITY)
Admission: RE | Admit: 2020-05-29 | Discharge: 2020-05-29 | Disposition: A | Discharge status: Home / Self Care | Payer: 59 | Attending: Orthopedic Surgery | Admitting: Orthopedic Surgery

## 2020-05-29 DIAGNOSIS — M1990 Unspecified osteoarthritis, unspecified site: Secondary | ICD-10-CM | POA: Insufficient documentation

## 2020-05-29 DIAGNOSIS — E785 Hyperlipidemia, unspecified: Secondary | ICD-10-CM | POA: Diagnosis not present

## 2020-05-29 DIAGNOSIS — Y9389 Activity, other specified: Secondary | ICD-10-CM | POA: Diagnosis not present

## 2020-05-29 DIAGNOSIS — W1839XA Other fall on same level, initial encounter: Secondary | ICD-10-CM | POA: Insufficient documentation

## 2020-05-29 DIAGNOSIS — Z7984 Long term (current) use of oral hypoglycemic drugs: Secondary | ICD-10-CM | POA: Insufficient documentation

## 2020-05-29 DIAGNOSIS — Z8261 Family history of arthritis: Secondary | ICD-10-CM | POA: Diagnosis not present

## 2020-05-29 DIAGNOSIS — Z833 Family history of diabetes mellitus: Secondary | ICD-10-CM | POA: Insufficient documentation

## 2020-05-29 DIAGNOSIS — S42412A Displaced simple supracondylar fracture without intercondylar fracture of left humerus, initial encounter for closed fracture: Secondary | ICD-10-CM

## 2020-05-29 DIAGNOSIS — E119 Type 2 diabetes mellitus without complications: Secondary | ICD-10-CM | POA: Diagnosis not present

## 2020-05-29 DIAGNOSIS — S42302A Unspecified fracture of shaft of humerus, left arm, initial encounter for closed fracture: Secondary | ICD-10-CM | POA: Diagnosis present

## 2020-05-29 DIAGNOSIS — Z89512 Acquired absence of left leg below knee: Secondary | ICD-10-CM | POA: Insufficient documentation

## 2020-05-29 DIAGNOSIS — I10 Essential (primary) hypertension: Secondary | ICD-10-CM | POA: Insufficient documentation

## 2020-05-29 DIAGNOSIS — F329 Major depressive disorder, single episode, unspecified: Secondary | ICD-10-CM | POA: Diagnosis not present

## 2020-05-29 DIAGNOSIS — R519 Headache, unspecified: Secondary | ICD-10-CM | POA: Insufficient documentation

## 2020-05-29 DIAGNOSIS — M2578 Osteophyte, vertebrae: Secondary | ICD-10-CM | POA: Diagnosis not present

## 2020-05-29 DIAGNOSIS — S42352A Displaced comminuted fracture of shaft of humerus, left arm, initial encounter for closed fracture: Secondary | ICD-10-CM

## 2020-05-29 DIAGNOSIS — Z87891 Personal history of nicotine dependence: Secondary | ICD-10-CM | POA: Diagnosis not present

## 2020-05-29 HISTORY — DX: Depression, unspecified: F32.A

## 2020-05-29 HISTORY — PX: ORIF HUMERUS FRACTURE: SHX2126

## 2020-05-29 HISTORY — DX: Unspecified osteoarthritis, unspecified site: M19.90

## 2020-05-29 HISTORY — DX: Acute pancreatitis without necrosis or infection, unspecified: K85.90

## 2020-05-29 HISTORY — DX: Headache, unspecified: R51.9

## 2020-05-29 LAB — GLUCOSE, CAPILLARY
Glucose-Capillary: 275 mg/dL — ABNORMAL HIGH (ref 70–99)
Glucose-Capillary: 185 mg/dL — ABNORMAL HIGH (ref 70–99)
Glucose-Capillary: 155 mg/dL — ABNORMAL HIGH (ref 70–99)

## 2020-05-29 LAB — BASIC METABOLIC PANEL
Anion gap: 14 (ref 5–15)
BUN: 7 mg/dL (ref 6–20)
CO2: 22 mmol/L (ref 22–32)
Calcium: 9.2 mg/dL (ref 8.9–10.3)
Chloride: 98 mmol/L (ref 98–111)
Creatinine, Ser: 0.88 mg/dL (ref 0.61–1.24)
GFR calc Af Amer: >60 mL/min (ref >60)
GFR calc non Af Amer: >60 mL/min (ref >60)
Glucose, Bld: 251 mg/dL — ABNORMAL HIGH (ref 70–99)
Potassium: 4.4 mmol/L (ref 3.5–5.1)
Sodium: 134 mmol/L — ABNORMAL LOW (ref 135–145)

## 2020-05-29 LAB — CBC
HCT: 36.8 % — ABNORMAL LOW (ref 39.0–52.0)
Hemoglobin: 12.4 g/dL — ABNORMAL LOW (ref 13.0–17.0)
MCH: 35.3 pg — ABNORMAL HIGH (ref 26.0–34.0)
MCHC: 33.7 g/dL (ref 30.0–36.0)
MCV: 104.8 fL — ABNORMAL HIGH (ref 80.0–100.0)
Platelets: 160 10*3/uL (ref 150–400)
RBC: 3.51 MIL/uL — ABNORMAL LOW (ref 4.22–5.81)
RDW: 13.2 % (ref 11.5–15.5)
WBC: 3.2 10*3/uL — ABNORMAL LOW (ref 4.0–10.5)
nRBC: 0 % (ref 0.0–0.2)

## 2020-05-29 SURGERY — OPEN REDUCTION INTERNAL FIXATION (ORIF) HUMERAL SHAFT FRACTURE
Anesthesia: General | Laterality: Left

## 2020-05-29 MED ORDER — ACETAMINOPHEN 325 MG PO TABS: 325.0000 mg | ORAL_TABLET | Freq: Once | ORAL | Status: DC | PRN

## 2020-05-29 MED ORDER — ACETAMINOPHEN 160 MG/5ML PO SOLN: 325.0000 mg | Freq: Once | ORAL | Status: DC | PRN

## 2020-05-29 MED ORDER — DEXAMETHASONE SODIUM PHOSPHATE 10 MG/ML IJ SOLN
INTRAMUSCULAR | Status: DC | PRN
  Administered 2020-05-29: 10 mg via INTRAVENOUS

## 2020-05-29 MED ORDER — PROPOFOL 10 MG/ML IV BOLUS
INTRAVENOUS | Status: AC
  Filled 2020-05-29: qty 20

## 2020-05-29 MED ORDER — ACETAMINOPHEN 10 MG/ML IV SOLN: 1000.0000 mg | Freq: Once | INTRAVENOUS | Status: DC | PRN

## 2020-05-29 MED ORDER — OXYCODONE-ACETAMINOPHEN 5-325 MG PO TABS
1.0000 | ORAL_TABLET | ORAL | 0 refills | Status: DC | PRN
Start: 2020-05-29 — End: 2020-06-05

## 2020-05-29 MED ORDER — ORAL CARE MOUTH RINSE: 15.0000 mL | Freq: Once | OROMUCOSAL | Status: AC

## 2020-05-29 MED ORDER — SUCCINYLCHOLINE CHLORIDE 200 MG/10ML IV SOSY
PREFILLED_SYRINGE | INTRAVENOUS | Status: DC | PRN
  Administered 2020-05-29: 120 mg via INTRAVENOUS

## 2020-05-29 MED ORDER — ONDANSETRON HCL 4 MG/2ML IJ SOLN
INTRAMUSCULAR | Status: DC | PRN
  Administered 2020-05-29: 4 mg via INTRAVENOUS

## 2020-05-29 MED ORDER — LABETALOL HCL 5 MG/ML IV SOLN
INTRAVENOUS | Status: AC
  Administered 2020-05-29: 5 mg via INTRAVENOUS
  Filled 2020-05-29: qty 4

## 2020-05-29 MED ORDER — HYDROMORPHONE HCL 1 MG/ML IJ SOLN
INTRAMUSCULAR | Status: AC
  Filled 2020-05-29: qty 1

## 2020-05-29 MED ORDER — ALBUMIN HUMAN 5 % IV SOLN
INTRAVENOUS | Status: DC | PRN
Start: 2020-05-29 — End: 2020-05-29

## 2020-05-29 MED ORDER — LIDOCAINE 2% (20 MG/ML) 5 ML SYRINGE
INTRAMUSCULAR | Status: DC | PRN
  Administered 2020-05-29: 40 mg via INTRAVENOUS

## 2020-05-29 MED ORDER — MEPERIDINE HCL 25 MG/ML IJ SOLN: 6.2500 mg | INTRAMUSCULAR | Status: DC | PRN

## 2020-05-29 MED ORDER — PROPOFOL 10 MG/ML IV BOLUS
INTRAVENOUS | Status: DC | PRN
  Administered 2020-05-29: 50 mg via INTRAVENOUS
  Administered 2020-05-29: 150 mg via INTRAVENOUS

## 2020-05-29 MED ORDER — HYDROMORPHONE HCL 1 MG/ML IJ SOLN
0.2500 mg | INTRAMUSCULAR | Status: DC | PRN
  Administered 2020-05-29: 0.5 mg via INTRAVENOUS

## 2020-05-29 MED ORDER — ROCURONIUM BROMIDE 10 MG/ML (PF) SYRINGE
PREFILLED_SYRINGE | INTRAVENOUS | Status: AC
  Filled 2020-05-29: qty 10

## 2020-05-29 MED ORDER — LIDOCAINE 2% (20 MG/ML) 5 ML SYRINGE
INTRAMUSCULAR | Status: AC
  Filled 2020-05-29: qty 5

## 2020-05-29 MED ORDER — PHENYLEPHRINE 40 MCG/ML (10ML) SYRINGE FOR IV PUSH (FOR BLOOD PRESSURE SUPPORT)
PREFILLED_SYRINGE | INTRAVENOUS | Status: AC
  Filled 2020-05-29: qty 10

## 2020-05-29 MED ORDER — INSULIN ASPART 100 UNIT/ML ~~LOC~~ SOLN
SUBCUTANEOUS | Status: AC
  Administered 2020-05-29: 10 [IU] via INTRAVENOUS
  Filled 2020-05-29: qty 1

## 2020-05-29 MED ORDER — CHLORHEXIDINE GLUCONATE 0.12 % MT SOLN
15.0000 mL | Freq: Once | OROMUCOSAL | Status: AC
  Administered 2020-05-29: 15 mL via OROMUCOSAL
  Filled 2020-05-29: qty 15

## 2020-05-29 MED ORDER — PHENYLEPHRINE HCL-NACL 10-0.9 MG/250ML-% IV SOLN
INTRAVENOUS | Status: DC | PRN
Start: 2020-05-29 — End: 2020-05-29
  Administered 2020-05-29: 50 ug/min via INTRAVENOUS

## 2020-05-29 MED ORDER — MIDAZOLAM HCL 2 MG/2ML IJ SOLN
INTRAMUSCULAR | Status: AC
  Filled 2020-05-29: qty 2

## 2020-05-29 MED ORDER — FENTANYL CITRATE (PF) 250 MCG/5ML IJ SOLN
INTRAMUSCULAR | Status: DC | PRN
  Administered 2020-05-29: 100 ug via INTRAVENOUS

## 2020-05-29 MED ORDER — LACTATED RINGERS IV SOLN: INTRAVENOUS | Status: DC

## 2020-05-29 MED ORDER — FENTANYL CITRATE (PF) 100 MCG/2ML IJ SOLN: 50.0000 ug | Freq: Once | INTRAMUSCULAR | Status: AC

## 2020-05-29 MED ORDER — ONDANSETRON HCL 4 MG/2ML IJ SOLN
INTRAMUSCULAR | Status: AC
  Filled 2020-05-29: qty 2

## 2020-05-29 MED ORDER — BUPIVACAINE-EPINEPHRINE (PF) 0.5% -1:200000 IJ SOLN
INTRAMUSCULAR | Status: DC | PRN
  Administered 2020-05-29: 15 mL

## 2020-05-29 MED ORDER — LABETALOL HCL 5 MG/ML IV SOLN
INTRAVENOUS | Status: AC
  Filled 2020-05-29: qty 4

## 2020-05-29 MED ORDER — PROMETHAZINE HCL 25 MG/ML IJ SOLN: 6.2500 mg | INTRAMUSCULAR | Status: DC | PRN

## 2020-05-29 MED ORDER — INSULIN ASPART 100 UNIT/ML ~~LOC~~ SOLN: 10.0000 [IU] | Freq: Once | SUBCUTANEOUS | Status: AC

## 2020-05-29 MED ORDER — PHENYLEPHRINE 40 MCG/ML (10ML) SYRINGE FOR IV PUSH (FOR BLOOD PRESSURE SUPPORT)
PREFILLED_SYRINGE | INTRAVENOUS | Status: DC | PRN
  Administered 2020-05-29 (×4): 200 ug via INTRAVENOUS

## 2020-05-29 MED ORDER — FENTANYL CITRATE (PF) 100 MCG/2ML IJ SOLN
INTRAMUSCULAR | Status: AC
  Administered 2020-05-29: 50 ug via INTRAVENOUS
  Filled 2020-05-29: qty 2

## 2020-05-29 MED ORDER — DEXAMETHASONE SODIUM PHOSPHATE 10 MG/ML IJ SOLN
INTRAMUSCULAR | Status: AC
  Filled 2020-05-29: qty 1

## 2020-05-29 MED ORDER — LABETALOL HCL 5 MG/ML IV SOLN: 5.0000 mg | Freq: Once | INTRAVENOUS | Status: AC

## 2020-05-29 MED ORDER — FENTANYL CITRATE (PF) 250 MCG/5ML IJ SOLN
INTRAMUSCULAR | Status: AC
  Filled 2020-05-29: qty 5

## 2020-05-29 MED ORDER — MIDAZOLAM HCL 2 MG/2ML IJ SOLN: 2.0000 mg | Freq: Once | INTRAMUSCULAR | Status: AC

## 2020-05-29 MED ORDER — LABETALOL HCL 5 MG/ML IV SOLN
5.0000 mg | Freq: Once | INTRAVENOUS | Status: AC
  Administered 2020-05-29: 5 mg via INTRAVENOUS

## 2020-05-29 MED ORDER — CEFAZOLIN SODIUM-DEXTROSE 2-4 GM/100ML-% IV SOLN
2.0000 g | INTRAVENOUS | Status: AC
  Administered 2020-05-29: 2 g via INTRAVENOUS
  Filled 2020-05-29: qty 100

## 2020-05-29 MED ORDER — 0.9 % SODIUM CHLORIDE (POUR BTL) OPTIME
TOPICAL | Status: DC | PRN
  Administered 2020-05-29: 1000 mL

## 2020-05-29 MED ORDER — MIDAZOLAM HCL 2 MG/2ML IJ SOLN
INTRAMUSCULAR | Status: AC
  Administered 2020-05-29: 2 mg via INTRAVENOUS
  Filled 2020-05-29: qty 2

## 2020-05-29 SURGICAL SUPPLY — 63 items
BIT DRILL 2.5 X LONG (BIT) ×1
BIT DRILL X LONG 2.5 (BIT) ×1 IMPLANT
BNDG COHESIVE 4X5 TAN STRL (GAUZE/BANDAGES/DRESSINGS) ×3 IMPLANT
BNDG ESMARK 4X9 LF (GAUZE/BANDAGES/DRESSINGS) ×3 IMPLANT
BNDG GAUZE ELAST 4 BULKY (GAUZE/BANDAGES/DRESSINGS) ×3 IMPLANT
CLOSURE WOUND 1/2 X4 (GAUZE/BANDAGES/DRESSINGS) ×1
COVER SURGICAL LIGHT HANDLE (MISCELLANEOUS) ×6 IMPLANT
COVER WAND RF STERILE (DRAPES) ×3 IMPLANT
CUFF TOURN SGL QUICK 18X4 (TOURNIQUET CUFF) ×3 IMPLANT
CUFF TOURN SGL QUICK 24 (TOURNIQUET CUFF)
CUFF TRNQT CYL 24X4X16.5-23 (TOURNIQUET CUFF) IMPLANT
DRAPE IMP U-DRAPE 54X76 (DRAPES) ×3 IMPLANT
DRAPE OEC MINIVIEW 54X84 (DRAPES) ×3 IMPLANT
DRAPE U-SHAPE 47X51 STRL (DRAPES) ×3 IMPLANT
DRILL BIT X LONG 2.5 (BIT) ×2
DRSG EMULSION OIL 3X3 NADH (GAUZE/BANDAGES/DRESSINGS) ×3 IMPLANT
DRSG PAD ABDOMINAL 8X10 ST (GAUZE/BANDAGES/DRESSINGS) IMPLANT
DURAPREP 26ML APPLICATOR (WOUND CARE) ×3 IMPLANT
ELECT REM PT RETURN 9FT ADLT (ELECTROSURGICAL) ×3
ELECTRODE REM PT RTRN 9FT ADLT (ELECTROSURGICAL) ×1 IMPLANT
GAUZE 4X4 16PLY RFD (DISPOSABLE) ×3 IMPLANT
GAUZE SPONGE 4X4 12PLY STRL (GAUZE/BANDAGES/DRESSINGS) ×3 IMPLANT
GLOVE BIOGEL PI IND STRL 9 (GLOVE) ×1 IMPLANT
GLOVE BIOGEL PI INDICATOR 9 (GLOVE) ×2
GLOVE SURG ORTHO 9.0 STRL STRW (GLOVE) ×3 IMPLANT
GOWN STRL REUS W/ TWL XL LVL3 (GOWN DISPOSABLE) ×2 IMPLANT
GOWN STRL REUS W/TWL XL LVL3 (GOWN DISPOSABLE) ×4
KIT BASIN OR (CUSTOM PROCEDURE TRAY) ×3 IMPLANT
KIT TURNOVER KIT B (KITS) ×3 IMPLANT
MANIFOLD NEPTUNE II (INSTRUMENTS) ×3 IMPLANT
NS IRRIG 1000ML POUR BTL (IV SOLUTION) ×3 IMPLANT
PACK ORTHO EXTREMITY (CUSTOM PROCEDURE TRAY) ×3 IMPLANT
PACK UNIVERSAL I (CUSTOM PROCEDURE TRAY) ×3 IMPLANT
PAD ABD 8X10 STRL (GAUZE/BANDAGES/DRESSINGS) ×3 IMPLANT
PAD ARMBOARD 7.5X6 YLW CONV (MISCELLANEOUS) ×6 IMPLANT
PAD CAST 4YDX4 CTTN HI CHSV (CAST SUPPLIES) IMPLANT
PADDING CAST COTTON 4X4 STRL (CAST SUPPLIES)
PROS LCP PLATE 14 189M (Plate) ×3 IMPLANT
PROSTHESIS LCP PLATE 14 189M (Plate) ×1 IMPLANT
SCREW CORTEX 3.5 24MM (Screw) ×2 IMPLANT
SCREW CORTEX 3.5 26MM (Screw) ×4 IMPLANT
SCREW CORTEX 3.5 28MM (Screw) ×2 IMPLANT
SCREW CORTEX 3.5 30MM (Screw) ×6 IMPLANT
SCREW LOCK CORT ST 3.5X24 (Screw) ×1 IMPLANT
SCREW LOCK CORT ST 3.5X26 (Screw) ×2 IMPLANT
SCREW LOCK CORT ST 3.5X28 (Screw) ×1 IMPLANT
SCREW LOCK CORT ST 3.5X30 (Screw) ×3 IMPLANT
SPONGE LAP 18X18 RF (DISPOSABLE) ×6 IMPLANT
STAPLER VISISTAT 35W (STAPLE) ×3 IMPLANT
STRIP CLOSURE SKIN 1/2X4 (GAUZE/BANDAGES/DRESSINGS) ×2 IMPLANT
SUCTION FRAZIER HANDLE 10FR (MISCELLANEOUS) ×2
SUCTION TUBE FRAZIER 10FR DISP (MISCELLANEOUS) ×1 IMPLANT
SUT FIBERTAPE CERCLAGE 2 48 (SUTURE) ×3 IMPLANT
SUT PROLENE 3 0 PS 1 (SUTURE) IMPLANT
SUT VIC AB 2-0 CTB1 (SUTURE) IMPLANT
SUT VIC AB 3-0 X1 27 (SUTURE) ×3 IMPLANT
SYR BULB EAR ULCER 3OZ GRN STR (SYRINGE) ×3 IMPLANT
TOWEL GREEN STERILE (TOWEL DISPOSABLE) ×3 IMPLANT
TOWEL GREEN STERILE FF (TOWEL DISPOSABLE) ×3 IMPLANT
TUBE CONNECTING 12'X1/4 (SUCTIONS) ×1
TUBE CONNECTING 12X1/4 (SUCTIONS) ×2 IMPLANT
WATER STERILE IRR 1000ML POUR (IV SOLUTION) ×3 IMPLANT
YANKAUER SUCT BULB TIP NO VENT (SUCTIONS) ×3 IMPLANT

## 2020-05-29 NOTE — Op Note (Signed)
05/29/2020  11:23 AM  PATIENT:  [name]    PRE-OPERATIVE DIAGNOSIS:  Left Humeral Fracture  POST-OPERATIVE DIAGNOSIS:  Same  PROCEDURE:  OPEN REDUCTION INTERNAL FIXATION (ORIF) LEFT HUMERUS C-arm fluoroscopy to verify alignment.  SURGEON:  Nadara Mustard, MD  PHYSICIAN ASSISTANT:None ANESTHESIA:   General  PREOPERATIVE INDICATIONS:  [name] is a  44 y.o. male with a diagnosis of Left Humeral Fracture who failed conservative measures and elected for surgical management.    The risks benefits and alternatives were discussed with the patient preoperatively including but not limited to the risks of infection, bleeding, nerve injury, cardiopulmonary complications, the need for revision surgery, among others, and the patient was willing to proceed.  OPERATIVE IMPLANTS: 14 hole LCP plate with FiberWire cerclage.  @ENCIMAGES @  OPERATIVE FINDINGS: Stable alignment in AP and lateral planes after fixation.  OPERATIVE PROCEDURE: Patient was brought the operating room after undergoing an interscalene block he then underwent a general anesthetic.  After adequate levels anesthesia obtained patient was placed in beachchair position and the left upper extremity was prepped using DuraPrep draped in a sterile field a timeout was called.  An anterior incision was made over the left arm.  This was carried down through the skin the fascial plane between the biceps and the brachialis was developed the biceps was retracted medially incision was made midline through the brachialis down to the humerus.  The bone fracture edges were freshened and cleansed.  The fracture was essentially in 3 large fragments.  The distal and medial fragment were reduced clamped and an interfrag screw was placed to secure the distal to the fragments.  This was then secured to the proximal fragment that was secured with clamps a second interfrag screw was used to secure the distal 2 fragments and a cerclage wire was placed around  the proximal fragments.  This reduced the fractures.  A 14 hole LCP plate was then placed anteriorly and compression screws were placed proximally and distally.  C-arm fluoroscopy verified alignment in both AP and lateral planes.  The wound was irrigated with normal saline incision was closed using 2-0 nylon a sterile dressing was applied patient was extubated taken to PACU in stable condition.   DISCHARGE PLANNING:  Antibiotic duration: Preoperative antibiotics  Weightbearing: Sling left arm nonweightbearing  Pain medication: Prescription for Percocet  Dressing care/ Wound VAC: Continue dressing and change in follow-up  Ambulatory devices: Not applicable  Discharge to: Home.  Follow-up: In the office 1 week post operative.

## 2020-05-29 NOTE — Progress Notes (Signed)
Elevated BP and CBG, Dr. Hart Rochester made aware, verbal orders given.
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
## 2020-05-29 NOTE — Anesthesia Procedure Notes (Signed)
Anesthesia Regional Block: Interscalene brachial plexus block   Pre-Anesthetic Checklist: ,, timeout performed, Correct Patient, Correct Site, Correct Laterality, Correct Procedure, Correct Position, site marked, Risks and benefits discussed,  Surgical consent,  Pre-op evaluation,  At surgeon's request and post-op pain management  Laterality: Left  Prep: chloraprep       Needles:  Injection technique: Single-shot  Needle Type: Echogenic Stimulator Needle     Needle Length: 9cm  Needle Gauge: 21     Additional Needles:   Procedures:,,,, ultrasound used (permanent image in chart),,,,  Narrative:  Start time: 05/29/2020 9:30 AM End time: 05/29/2020 9:36 AM Injection made incrementally with aspirations every 5 mL.  Performed by: Personally  Anesthesiologist: Shelton Silvas, MD  Additional Notes: Patient tolerated the procedure well. Local anesthetic introduced in an incremental fashion under minimal resistance after negative aspirations. No paresthesias were elicited. After completion of the procedure, no acute issues were identified and patient continued to be monitored by RN.

## 2020-05-29 NOTE — Anesthesia Postprocedure Evaluation (Signed)
Anesthesia Post Note  Patient: [name]  Procedure(s) Performed: OPEN REDUCTION INTERNAL FIXATION (ORIF) LEFT HUMERUS (Left )     Patient location during evaluation: PACU Anesthesia Type: General Level of consciousness: awake and alert Pain management: pain level controlled Vital Signs Assessment: post-procedure vital signs reviewed and stable Respiratory status: spontaneous breathing, nonlabored ventilation, respiratory function stable and patient connected to nasal cannula oxygen Cardiovascular status: blood pressure returned to baseline and stable Postop Assessment: no apparent nausea or vomiting Anesthetic complications: no   No complications documented.  Last Vitals:  Vitals:   05/29/20 1210 05/29/20 1225  BP: (!) 153/104 (!) 154/105  Pulse: 80 79  Resp: 15 16  Temp:  36.6 C  SpO2: 100% 99%    Last Pain:  Vitals:   05/29/20 1225  TempSrc:   PainSc: 3                  Shelton Silvas

## 2020-05-29 NOTE — Anesthesia Preprocedure Evaluation (Addendum)
Anesthesia Evaluation  Patient identified by MRN, date of birth, ID band Patient awake    Reviewed: Allergy & Precautions, NPO status , Patient's Chart, lab work & pertinent test results  Airway Mallampati: II  TM Distance: >3 FB Neck ROM: Full    Dental  (+) Teeth Intact, Dental Advisory Given, Loose,    Pulmonary former smoker,    breath sounds clear to auscultation       Cardiovascular hypertension, Pt. on medications  Rhythm:Regular Rate:Normal     Neuro/Psych  Headaches, PSYCHIATRIC DISORDERS Depression    GI/Hepatic negative GI ROS, Neg liver ROS,   Endo/Other  diabetes, Type 2, Insulin Dependent, Oral Hypoglycemic Agents  Renal/GU negative Renal ROS  negative genitourinary   Musculoskeletal  (+) Arthritis ,   Abdominal Normal abdominal exam  (+)   Peds  Hematology   Anesthesia Other Findings   Reproductive/Obstetrics                           Anesthesia Physical Anesthesia Plan  ASA: II  Anesthesia Plan: General   Post-op Pain Management: GA combined w/ Regional for post-op pain   Induction: Intravenous  PONV Risk Score and Plan: 3 and Ondansetron, Dexamethasone and Midazolam  Airway Management Planned: Oral ETT  Additional Equipment: None  Intra-op Plan:   Post-operative Plan: Extubation in OR  Informed Consent: I have reviewed the patients History and Physical, chart, labs and discussed the procedure including the risks, benefits and alternatives for the proposed anesthesia with the patient or authorized representative who has indicated his/her understanding and acceptance.     Dental advisory given  Plan Discussed with: CRNA  Anesthesia Plan Comments:        Anesthesia Quick Evaluation

## 2020-05-29 NOTE — Transfer of Care (Signed)
Immediate Anesthesia Transfer of Care Note  Patient: [name]  Procedure(s) Performed: OPEN REDUCTION INTERNAL FIXATION (ORIF) LEFT HUMERUS (Left )  Patient Location: PACU  Anesthesia Type:GA combined with regional for post-op pain  Level of Consciousness: awake, alert  and oriented  Airway & Oxygen Therapy: Patient Spontanous Breathing  Post-op Assessment: Report given to RN, Post -op Vital signs reviewed and stable and Patient moving all extremities  Post vital signs: Reviewed and stable  Last Vitals:  Vitals Value Taken Time  BP 125/83 05/29/20 1116  Temp    Pulse 88 05/29/20 1117  Resp 21 05/29/20 1117  SpO2 100 % 05/29/20 1117  Vitals shown include unvalidated device data.  Last Pain:  Vitals:   05/29/20 0934  TempSrc:   PainSc: 0-No pain         Complications: No complications documented.

## 2020-05-29 NOTE — Anesthesia Procedure Notes (Signed)
Procedure Name: Intubation Date/Time: 05/29/2020 9:51 AM Performed by: Rosiland Oz, CRNA Pre-anesthesia Checklist: Patient identified, Emergency Drugs available, Suction available, Patient being monitored and Timeout performed Patient Re-evaluated:Patient Re-evaluated prior to induction Oxygen Delivery Method: Circle system utilized Preoxygenation: Pre-oxygenation with 100% oxygen Induction Type: IV induction Ventilation: Mask ventilation without difficulty Laryngoscope Size: Miller and 3 Grade View: Grade I Tube type: Oral Tube size: 7.5 mm Number of attempts: 1 Airway Equipment and Method: Stylet Placement Confirmation: ETT inserted through vocal cords under direct vision,  positive ETCO2 and breath sounds checked- equal and bilateral Secured at: 22 cm Tube secured with: Tape Dental Injury: Teeth and Oropharynx as per pre-operative assessment

## 2020-06-02 ENCOUNTER — Encounter (HOSPITAL_COMMUNITY): Payer: Self-pay | Admitting: Orthopedic Surgery

## 2020-06-03 ENCOUNTER — Telehealth: Payer: Self-pay | Admitting: Physical Therapy

## 2020-06-03 ENCOUNTER — Encounter: Payer: 59 | Admitting: Physical Therapy

## 2020-06-03 NOTE — Telephone Encounter (Signed)
Thank you He did not show for PT today.

## 2020-06-03 NOTE — Telephone Encounter (Signed)
Yes he can do PT partial weight through the left arm

## 2020-06-03 NOTE — Telephone Encounter (Signed)
Dr Lajoyce Corners I evaluated [name] on 05/25/2020 a plan of care for prosthetic training. I noticed on chart review that he fell on 6/30 with left humerus fracture with ORIF 05/29/2020. Does he have medical clearance to continue PT plan of care? He has an appointment today at 11:00. Are there any restrictions? Zella Ball

## 2020-06-05 ENCOUNTER — Ambulatory Visit (INDEPENDENT_AMBULATORY_CARE_PROVIDER_SITE_OTHER): Payer: 59 | Admitting: Family

## 2020-06-05 ENCOUNTER — Encounter: Payer: Self-pay | Admitting: Family

## 2020-06-05 ENCOUNTER — Other Ambulatory Visit: Payer: Self-pay

## 2020-06-05 VITALS — Ht 73.0 in | Wt 259.9 lb

## 2020-06-05 DIAGNOSIS — S42302A Unspecified fracture of shaft of humerus, left arm, initial encounter for closed fracture: Secondary | ICD-10-CM

## 2020-06-05 MED ORDER — OXYCODONE-ACETAMINOPHEN 5-325 MG PO TABS: 1.0000 | ORAL_TABLET | ORAL | 0 refills | Status: DC | PRN
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
## 2020-06-09 ENCOUNTER — Other Ambulatory Visit: Payer: Self-pay

## 2020-06-09 ENCOUNTER — Other Ambulatory Visit: Payer: Self-pay | Admitting: Nurse Practitioner

## 2020-06-09 ENCOUNTER — Encounter: Payer: Self-pay | Admitting: Nurse Practitioner

## 2020-06-09 ENCOUNTER — Ambulatory Visit: Payer: 59 | Attending: Nurse Practitioner | Admitting: Nurse Practitioner

## 2020-06-09 VITALS — BP 139/97 | HR 98 | Temp 97.7°F | Ht 73.0 in | Wt 279.0 lb

## 2020-06-09 DIAGNOSIS — E118 Type 2 diabetes mellitus with unspecified complications: Secondary | ICD-10-CM

## 2020-06-09 DIAGNOSIS — IMO0002 Reserved for concepts with insufficient information to code with codable children: Secondary | ICD-10-CM

## 2020-06-09 DIAGNOSIS — I10 Essential (primary) hypertension: Secondary | ICD-10-CM | POA: Diagnosis not present

## 2020-06-09 DIAGNOSIS — E1165 Type 2 diabetes mellitus with hyperglycemia: Secondary | ICD-10-CM | POA: Diagnosis not present

## 2020-06-09 DIAGNOSIS — D72819 Decreased white blood cell count, unspecified: Secondary | ICD-10-CM

## 2020-06-09 DIAGNOSIS — F320 Major depressive disorder, single episode, mild: Secondary | ICD-10-CM

## 2020-06-09 DIAGNOSIS — E782 Mixed hyperlipidemia: Secondary | ICD-10-CM

## 2020-06-09 LAB — POCT GLYCOSYLATED HEMOGLOBIN (HGB A1C): Hemoglobin A1C: 6.9 % — AB (ref 4.0–5.6)

## 2020-06-09 LAB — GLUCOSE, POCT (MANUAL RESULT ENTRY): POC Glucose: 207 mg/dl — AB (ref 70–99)

## 2020-06-09 MED ORDER — FLUOXETINE HCL 10 MG PO CAPS: 10.0000 mg | ORAL_CAPSULE | Freq: Every day | ORAL | 3 refills | Status: DC

## 2020-06-09 MED ORDER — BD PEN NEEDLE MINI U/F 31G X 5 MM MISC: 6 refills | Status: DC

## 2020-06-09 MED ORDER — TRUE METRIX BLOOD GLUCOSE TEST VI STRP: ORAL_STRIP | 12 refills | Status: DC

## 2020-06-09 MED ORDER — LANTUS SOLOSTAR 100 UNIT/ML ~~LOC~~ SOPN: 30.0000 [IU] | PEN_INJECTOR | Freq: Every day | SUBCUTANEOUS | 3 refills | Status: DC

## 2020-06-09 MED ORDER — NOVOLOG FLEXPEN 100 UNIT/ML ~~LOC~~ SOPN: 10.0000 [IU] | PEN_INJECTOR | Freq: Three times a day (TID) | SUBCUTANEOUS | 3 refills | Status: DC

## 2020-06-09 MED ORDER — LOSARTAN POTASSIUM 50 MG PO TABS: 50.0000 mg | ORAL_TABLET | Freq: Every day | ORAL | 1 refills | Status: DC

## 2020-06-09 MED ORDER — ATORVASTATIN CALCIUM 20 MG PO TABS: 20.0000 mg | ORAL_TABLET | Freq: Every day | ORAL | 3 refills | Status: DC

## 2020-06-09 MED ORDER — VICTOZA 18 MG/3ML ~~LOC~~ SOPN: 1.8000 mg | PEN_INJECTOR | Freq: Every day | SUBCUTANEOUS | 3 refills | Status: DC

## 2020-06-09 MED ORDER — LOSARTAN POTASSIUM 100 MG PO TABS: 100.0000 mg | ORAL_TABLET | Freq: Every day | ORAL | 1 refills | Status: DC

## 2020-06-09 MED ORDER — TRUEPLUS LANCETS 28G MISC: 3 refills | Status: DC

## 2020-06-09 MED FILL — VICTOZA 18 MG/3 ML INJECT P: 18 | 29 days supply | Qty: 9 | Fill #0

## 2020-06-09 MED FILL — FLUoxetine HCL 10 MG CAPS: 10 | 30 days supply | Qty: 30 | Fill #0

## 2020-06-09 MED FILL — ?BASAGLAR 100 UNITS/ML KWPE: 100 | 30 days supply | Qty: 9 | Fill #0

## 2020-06-09 MED FILL — LOSARTAN POTASSIUM 50 MG TA: 50 | 30 days supply | Qty: 30 | Fill #0

## 2020-06-09 MED FILL — ?ATORVASTATIN 20 MG TABLET: 20 | 30 days supply | Qty: 30 | Fill #0

## 2020-06-09 MED FILL — TRUEplus LANCETS 28G MISC: 30 days supply | Qty: 100 | Fill #0

## 2020-06-09 MED FILL — TRUEPLUS 5-BEVEL PEN NEEDLE: 31G X 5 MM | 25 days supply | Qty: 100 | Fill #0

## 2020-06-09 MED FILL — !NOVOLOG FLEXPEN SYRINGE 1: 100/ML | 30 days supply | Qty: 9 | Fill #0

## 2020-06-09 MED FILL — TRUE METRIX TEST STRIP: 30 days supply | Qty: 100 | Fill #0
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
## 2020-06-10 ENCOUNTER — Encounter: Payer: 59 | Admitting: Physical Therapy

## 2020-06-10 ENCOUNTER — Other Ambulatory Visit: Payer: Self-pay | Admitting: Nurse Practitioner

## 2020-06-10 DIAGNOSIS — D649 Anemia, unspecified: Secondary | ICD-10-CM

## 2020-06-10 LAB — MICROALBUMIN / CREATININE URINE RATIO
Creatinine, Urine: 148 mg/dL
Microalb/Creat Ratio: 40 mg/g creat — ABNORMAL HIGH (ref 0–29)
Microalbumin, Urine: 58.6 ug/mL

## 2020-06-10 LAB — CBC
Hematocrit: 33.6 % — ABNORMAL LOW (ref 37.5–51.0)
Hemoglobin: 11.5 g/dL — ABNORMAL LOW (ref 13.0–17.7)
MCH: 34.8 pg — ABNORMAL HIGH (ref 26.6–33.0)
MCHC: 34.2 g/dL (ref 31.5–35.7)
MCV: 102 fL — ABNORMAL HIGH (ref 79–97)
Platelets: 309 10*3/uL (ref 150–450)
RBC: 3.3 x10E6/uL — ABNORMAL LOW (ref 4.14–5.80)
RDW: 12.5 % (ref 11.6–15.4)
WBC: 4.2 10*3/uL (ref 3.4–10.8)

## 2020-06-10 LAB — BASIC METABOLIC PANEL
BUN/Creatinine Ratio: 5 — ABNORMAL LOW (ref 9–20)
BUN: 4 mg/dL — ABNORMAL LOW (ref 6–24)
CO2: 25 mmol/L (ref 20–29)
Calcium: 9.7 mg/dL (ref 8.7–10.2)
Chloride: 97 mmol/L (ref 96–106)
Creatinine, Ser: 0.84 mg/dL (ref 0.76–1.27)
GFR calc Af Amer: 122 mL/min/{1.73_m2} (ref >59)
GFR calc non Af Amer: 106 mL/min/{1.73_m2} (ref >59)
Glucose: 195 mg/dL — ABNORMAL HIGH (ref 65–99)
Potassium: 4.4 mmol/L (ref 3.5–5.2)
Sodium: 136 mmol/L (ref 134–144)

## 2020-06-11 ENCOUNTER — Ambulatory Visit: Payer: Self-pay

## 2020-06-11 ENCOUNTER — Encounter: Payer: Self-pay | Admitting: Gastroenterology

## 2020-06-11 ENCOUNTER — Ambulatory Visit (INDEPENDENT_AMBULATORY_CARE_PROVIDER_SITE_OTHER): Payer: 59 | Admitting: Physician Assistant

## 2020-06-11 ENCOUNTER — Encounter: Payer: 59 | Admitting: Physical Therapy

## 2020-06-11 ENCOUNTER — Encounter: Payer: Self-pay | Admitting: Physician Assistant

## 2020-06-11 ENCOUNTER — Other Ambulatory Visit: Payer: Self-pay

## 2020-06-11 VITALS — Ht 73.0 in | Wt 279.0 lb

## 2020-06-11 DIAGNOSIS — S42302A Unspecified fracture of shaft of humerus, left arm, initial encounter for closed fracture: Secondary | ICD-10-CM

## 2020-06-11 MED ORDER — OXYCODONE-ACETAMINOPHEN 5-325 MG PO TABS: 1.0000 | ORAL_TABLET | ORAL | 0 refills | Status: DC | PRN
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
## 2020-06-17 ENCOUNTER — Encounter: Payer: Self-pay | Admitting: Physical Therapy

## 2020-06-17 ENCOUNTER — Ambulatory Visit (INDEPENDENT_AMBULATORY_CARE_PROVIDER_SITE_OTHER): Payer: 59 | Admitting: Physical Therapy

## 2020-06-17 ENCOUNTER — Other Ambulatory Visit: Payer: Self-pay

## 2020-06-17 DIAGNOSIS — Z9181 History of falling: Secondary | ICD-10-CM | POA: Diagnosis not present

## 2020-06-17 DIAGNOSIS — M25612 Stiffness of left shoulder, not elsewhere classified: Secondary | ICD-10-CM

## 2020-06-17 DIAGNOSIS — R293 Abnormal posture: Secondary | ICD-10-CM

## 2020-06-17 DIAGNOSIS — M25622 Stiffness of left elbow, not elsewhere classified: Secondary | ICD-10-CM

## 2020-06-17 DIAGNOSIS — R2689 Other abnormalities of gait and mobility: Secondary | ICD-10-CM | POA: Diagnosis not present

## 2020-06-17 DIAGNOSIS — R29898 Other symptoms and signs involving the musculoskeletal system: Secondary | ICD-10-CM

## 2020-06-17 DIAGNOSIS — R2681 Unsteadiness on feet: Secondary | ICD-10-CM

## 2020-06-17 DIAGNOSIS — M79622 Pain in left upper arm: Secondary | ICD-10-CM

## 2020-06-17 DIAGNOSIS — R600 Localized edema: Secondary | ICD-10-CM

## 2020-06-17 DIAGNOSIS — M25632 Stiffness of left wrist, not elsewhere classified: Secondary | ICD-10-CM

## 2020-06-17 DIAGNOSIS — M6281 Muscle weakness (generalized): Secondary | ICD-10-CM
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
## 2020-06-17 NOTE — Patient Instructions (Signed)
Access Code: [email or web address] URL: [email or web address] Date: 06/17/2020 Prepared by: Vladimir Faster  Exercises Supine Shoulder Flexion Extension AAROM with Dowel - 2 x daily - 7 x weekly - 1 sets - 10 reps - 10 seconds hold  Patient Education Ice Massage Scar Massage

## 2020-06-18 ENCOUNTER — Telehealth: Payer: Self-pay | Admitting: Orthopedic Surgery

## 2020-06-18 ENCOUNTER — Other Ambulatory Visit: Payer: Self-pay | Admitting: Physician Assistant

## 2020-06-18 MED ORDER — OXYCODONE-ACETAMINOPHEN 5-325 MG PO TABS: 1.0000 | ORAL_TABLET | ORAL | 0 refills | Status: DC | PRN

## 2020-06-18 NOTE — Telephone Encounter (Signed)
I called and sw pt to advise of message below. To call with any questions.  

## 2020-06-18 NOTE — Telephone Encounter (Signed)
Will call in 1 refill

## 2020-06-18 NOTE — Telephone Encounter (Signed)
05/29/20 ORIF left humerus pt is requesting refill on pain medication since he has started physical therapy and noticed some increased tenderness. Please advise.

## 2020-06-18 NOTE — Telephone Encounter (Signed)
Pt would like to see if he can get a rx for pain medicine due to PT having him extra sore? Pt would like a CB to know if this is possible.   [phone]

## 2020-06-24 ENCOUNTER — Ambulatory Visit (INDEPENDENT_AMBULATORY_CARE_PROVIDER_SITE_OTHER): Payer: 59 | Admitting: Physical Therapy

## 2020-06-24 ENCOUNTER — Other Ambulatory Visit: Payer: Self-pay

## 2020-06-24 DIAGNOSIS — R2689 Other abnormalities of gait and mobility: Secondary | ICD-10-CM

## 2020-06-24 DIAGNOSIS — M25622 Stiffness of left elbow, not elsewhere classified: Secondary | ICD-10-CM

## 2020-06-24 DIAGNOSIS — M6281 Muscle weakness (generalized): Secondary | ICD-10-CM

## 2020-06-24 DIAGNOSIS — R600 Localized edema: Secondary | ICD-10-CM

## 2020-06-24 DIAGNOSIS — M79622 Pain in left upper arm: Secondary | ICD-10-CM | POA: Diagnosis not present

## 2020-06-24 DIAGNOSIS — M25612 Stiffness of left shoulder, not elsewhere classified: Secondary | ICD-10-CM | POA: Diagnosis not present

## 2020-06-24 DIAGNOSIS — R29898 Other symptoms and signs involving the musculoskeletal system: Secondary | ICD-10-CM

## 2020-06-24 DIAGNOSIS — M25632 Stiffness of left wrist, not elsewhere classified: Secondary | ICD-10-CM

## 2020-06-24 NOTE — Patient Instructions (Signed)
Access Code: [email or web address] URL: [email or web address] Date: 06/24/2020 Prepared by: Vladimir Faster  Exercises Supported Elbow Flexion Extension PROM - 2 x daily - 7 x weekly - 1 sets - 15 reps - 5 seconds hold Seated Wrist Supination Stretch - 2 x daily - 7 x weekly - 1 sets - 15 reps - 5 seconds hold Seated Scapular Retraction - 2 x daily - 7 x weekly - 1 sets - 15 reps - 5 seconds hold Standing Scapular Depression - 2 x daily - 7 x weekly - 1 sets - 15 reps - 5 seconds hold Standing Shoulder Extension with Dowel - 2 x daily - 7 x weekly - 1 sets - 15 reps - 5 seconds hold Supine Shoulder Flexion Extension AAROM with Dowel - 2 x daily - 7 x weekly - 1 sets - 15 reps - 5 seconds hold Supine Shoulder Abduction AAROM with Dowel - 2 x daily - 7 x weekly - 1 sets - 15 reps - 5 seconds hold Supine Shoulder Horizontal Abduction Adduction AAROM with Dowel - 2 x daily - 7 x weekly - 1 sets - 15 reps - 5 seconds hold
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
## 2020-06-25 ENCOUNTER — Other Ambulatory Visit: Payer: Self-pay | Admitting: Nurse Practitioner

## 2020-06-25 ENCOUNTER — Encounter: Payer: 59 | Admitting: Physical Therapy

## 2020-06-25 ENCOUNTER — Other Ambulatory Visit: Payer: Self-pay | Admitting: Pharmacist

## 2020-06-25 DIAGNOSIS — IMO0002 Reserved for concepts with insufficient information to code with codable children: Secondary | ICD-10-CM

## 2020-06-25 MED ORDER — ONETOUCH DELICA PLUS LANCET33G MISC: 2 refills | Status: DC

## 2020-06-25 MED ORDER — TRUE METRIX METER W/DEVICE KIT: PACK | 0 refills | Status: DC

## 2020-06-25 MED ORDER — ONETOUCH VERIO REFLECT W/DEVICE KIT: PACK | 0 refills | Status: DC

## 2020-06-25 MED ORDER — ONETOUCH VERIO VI STRP: ORAL_STRIP | 2 refills | Status: DC

## 2020-06-25 MED FILL — ONETOUCH DELICA PLUS LANCET: 30 days supply | Qty: 100 | Fill #0

## 2020-06-25 MED FILL — ONETOUCH VERIO REFLECT W/DE: W/DEVICE | 30 days supply | Qty: 1 | Fill #0

## 2020-06-25 MED FILL — ONE TOUCH VERIO TEST STRIP: 30 days supply | Qty: 100 | Fill #0

## 2020-06-25 NOTE — Telephone Encounter (Signed)
Medication Refill - Medication: Blood Glucose Monitoring Suppl (TRUE METRIX METER) w/Device KIT oxyCODONE-acetaminophen (PERCOCET) 5-325 MG tablet    Preferred Pharmacy (with phone number or street name):  [facility] Phone:  [phone]  Fax:  [phone]       Agent: Please be advised that RX refills may take up to 3 business days. We ask that you follow-up with your pharmacy.

## 2020-06-25 NOTE — Telephone Encounter (Signed)
Requested medication (s) are due for refill today: no  Requested medication (s) are on the active medication list: yes  Last refill:  06/18/2020  Future visit scheduled: no  Notes to clinic:  script filled by a different provider  Review for refill   Requested Prescriptions  Pending Prescriptions Disp Refills   oxyCODONE-acetaminophen (PERCOCET) 5-325 MG tablet 30 tablet 0    Sig: Take 1 tablet by mouth every 4 (four) hours as needed.      Not Delegated - Analgesics:  Opioid Agonist Combinations Failed - 06/25/2020 11:11 AM      Failed - This refill cannot be delegated      Failed - Urine Drug Screen completed in last 360 days.      Passed - Valid encounter within last 6 months    Recent Outpatient Visits           2 weeks ago Diabetes mellitus type 2, uncontrolled, with complications [facility])   [place], [place] W, NP   3 months ago Major depressive disorder with single episode, in partial remission [facility])   [place], Vernia Buff, NP   4 months ago Encounter for annual physical exam   [place], [place] W, NP   8 months ago Diabetes mellitus type 2, uncontrolled, with complications [facility])   [place] Gildardo Pounds, NP   9 months ago Essential hypertension   [place], RPH-CPP       Future Appointments             In 1 week [place], [place], [place]   In 2 months Gildardo Pounds, NP [place]             Signed Prescriptions Disp Refills   Blood Glucose Monitoring Suppl (TRUE METRIX METER) w/Device KIT 1 kit 0    Sig: Use as instructed      Endocrinology: Diabetes - Testing Supplies Passed - 06/25/2020 11:11 AM      Passed - Valid encounter within last 12 months    Recent Outpatient Visits           2 weeks  ago Diabetes mellitus type 2, uncontrolled, with complications [facility])   [place], [place] W, NP   3 months ago Major depressive disorder with single episode, in partial remission [facility])   [place], Vernia Buff, NP   4 months ago Encounter for annual physical exam   [place], [place] W, NP   8 months ago Diabetes mellitus type 2, uncontrolled, with complications [facility])   [place], Vernia Buff, NP   9 months ago Essential hypertension   [place], RPH-CPP       Future Appointments             In 1 week Persons, Bevely Palmer, [place]   In 2 months Gildardo Pounds, NP [place]

## 2020-06-30 ENCOUNTER — Encounter: Payer: 59 | Admitting: Physical Therapy

## 2020-07-01 ENCOUNTER — Encounter: Payer: 59 | Admitting: Physical Therapy

## 2020-07-02 ENCOUNTER — Encounter: Payer: 59 | Admitting: Physical Therapy

## 2020-07-02 ENCOUNTER — Ambulatory Visit: Payer: 59 | Admitting: Physician Assistant

## 2020-07-06 ENCOUNTER — Encounter: Payer: 59 | Admitting: Physical Therapy

## 2020-07-08 ENCOUNTER — Encounter: Payer: 59 | Admitting: Physical Therapy

## 2020-07-08 ENCOUNTER — Telehealth: Payer: Self-pay | Admitting: Physical Therapy

## 2020-07-08 NOTE — Telephone Encounter (Signed)
PT called due to no-show for 2 appointments. Left request to call office

## 2020-07-13 ENCOUNTER — Encounter: Payer: 59 | Admitting: Physical Therapy

## 2020-07-15 ENCOUNTER — Encounter: Payer: 59 | Admitting: Physical Therapy

## 2020-07-18 IMAGING — CR DG FOOT COMPLETE 3+V*L*
3 series · 3 of 3 positions shown · non-contrast
Comparison: None.

CLINICAL DATA: Acute LEFT foot pain with wound along 2nd toe.
Initial encounter.

EXAM:
LEFT FOOT - COMPLETE 3+ VIEW

[t foot ap left]
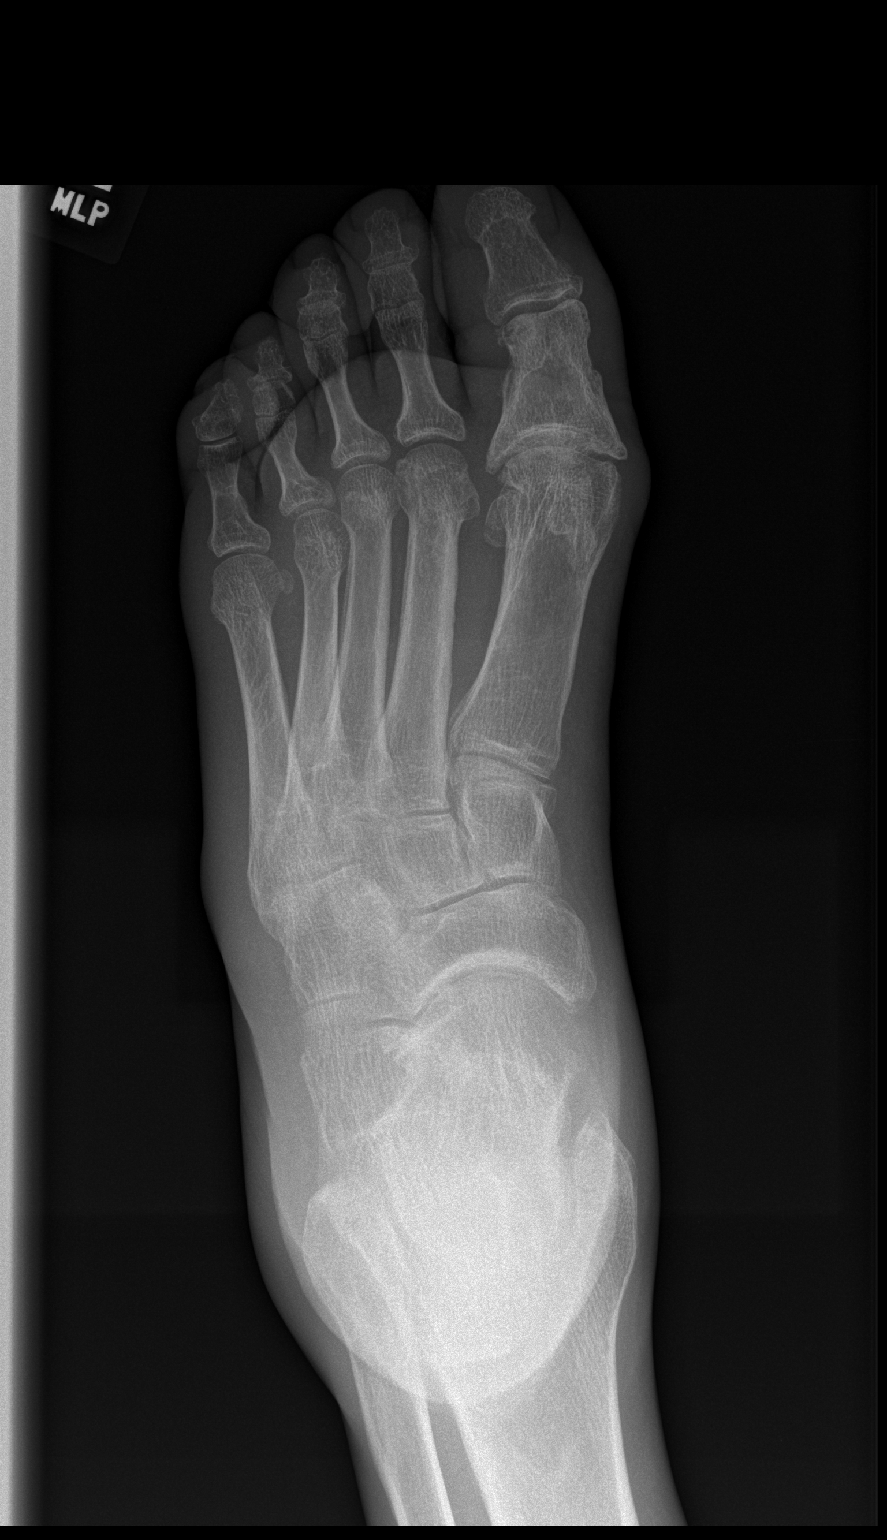

[t foot obl left]
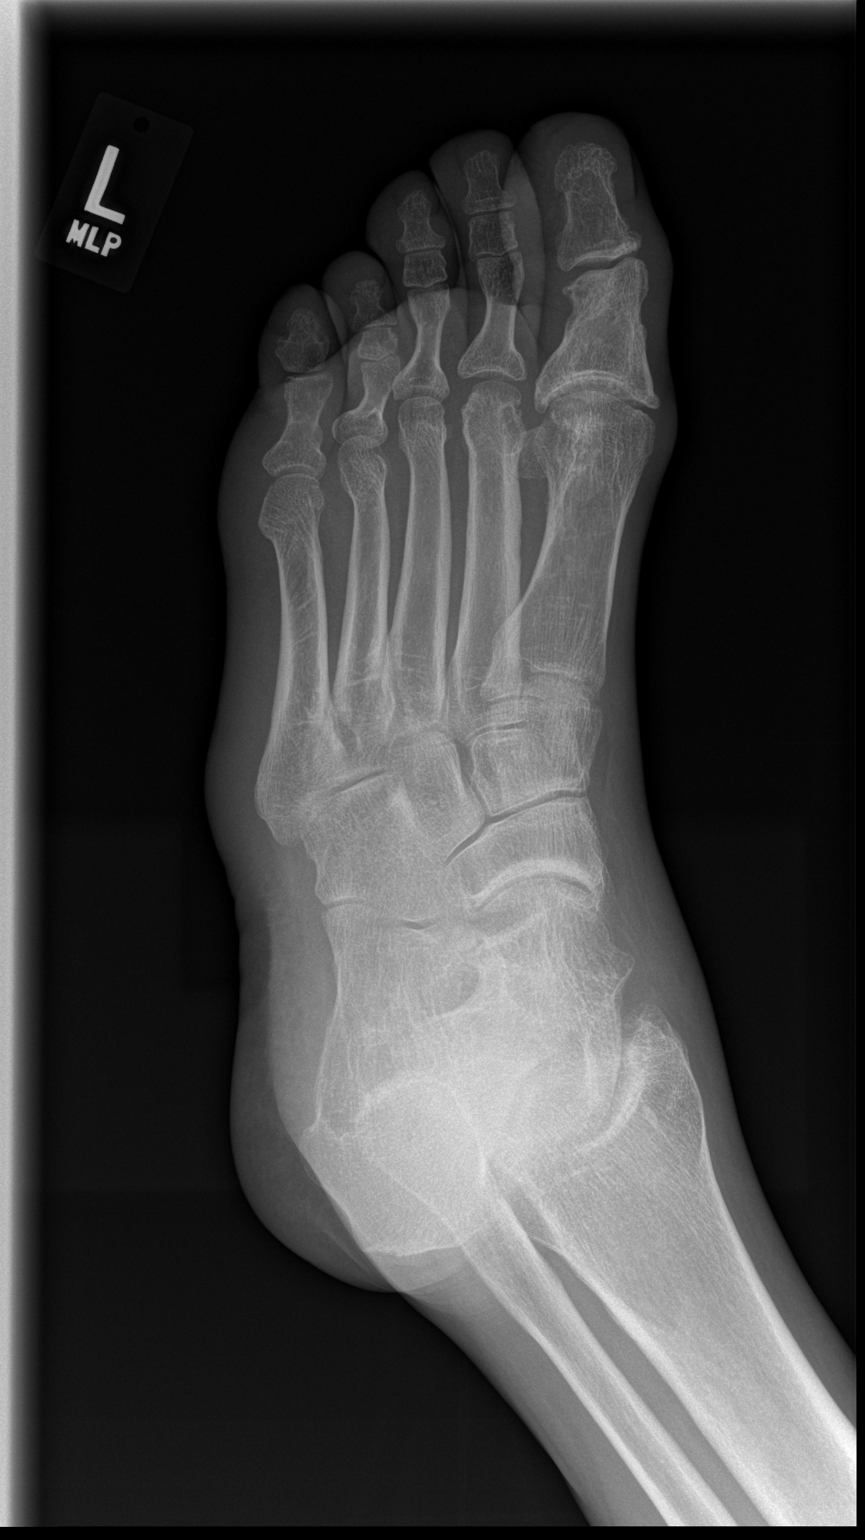

[t foot lat left]
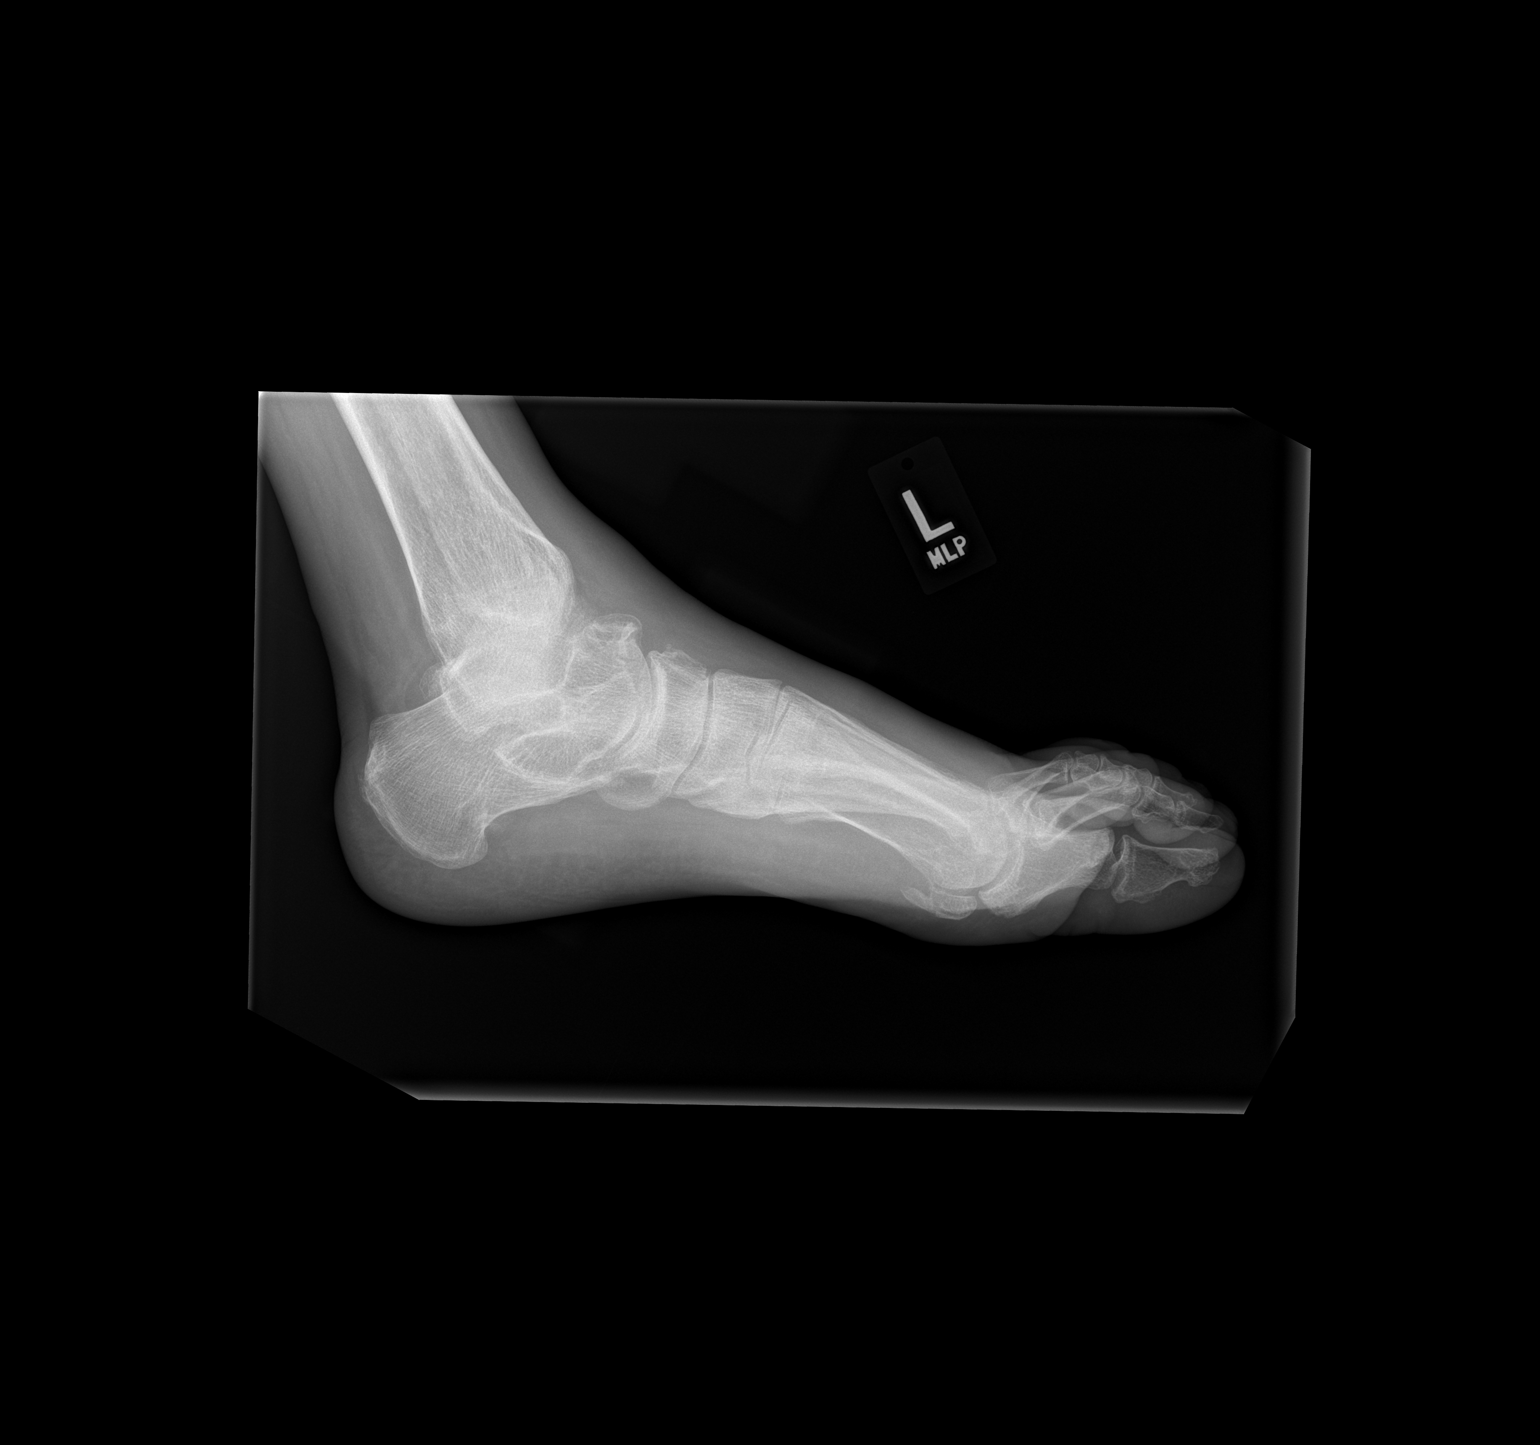

[3 of 3 positions shown; findings below may reference images not displayed]

FINDINGS: No evidence of acute bony abnormality including fracture or
osteomyelitis.

No evidence of subluxation or dislocation.

Mild degenerative changes at the 1st MTP joint and within the
midfoot noted.

No radiopaque foreign bodies identified.
IMPRESSION: No acute abnormality.

## 2020-07-20 ENCOUNTER — Encounter: Payer: 59 | Admitting: Physical Therapy

## 2020-07-20 ENCOUNTER — Encounter: Payer: Self-pay | Admitting: Physical Therapy

## 2020-07-20 NOTE — Therapy (Signed)
[facility] Physical Therapy [address] [place], [place], [address] Phone: [phone]   Fax:  [phone]  Patient Details  Name: [name] MRN: [id number] Date of Birth: 14-Jun-1976 Referring Provider:  Meridee Score, MD  Encounter Date: 07/20/2020  PHYSICAL THERAPY DISCHARGE SUMMARY  Visits from Start of Care: 3 visits attended from 05/25/2020 to 06/24/2020  Current functional level related to goals / functional outcomes: Unknown as patient has not returned to PT sessions. He has had 6 no-show appointments and PT is unable to reach him.    Remaining deficits: unknown   Education / Equipment: HEP  Plan: Patient agrees to discharge.  Patient goals were not met. Patient is being discharged due to not returning since the last visit.  ?????          Jamey Reas PT, DPT 07/20/2020, 12:43 PM  [facility] Physical Therapy [address] [place], [place], [address] Phone: [phone]   Fax:  [phone]

## 2020-07-22 ENCOUNTER — Encounter: Payer: 59 | Admitting: Physical Therapy

## 2020-07-28 ENCOUNTER — Encounter: Payer: 59 | Admitting: Physical Therapy

## 2020-07-30 ENCOUNTER — Encounter: Payer: 59 | Admitting: Physical Therapy

## 2020-08-04 ENCOUNTER — Encounter: Payer: 59 | Admitting: Physical Therapy

## 2020-08-06 ENCOUNTER — Encounter: Payer: 59 | Admitting: Physical Therapy

## 2020-08-10 ENCOUNTER — Ambulatory Visit: Payer: 59 | Admitting: Gastroenterology

## 2020-08-11 ENCOUNTER — Encounter: Payer: 59 | Admitting: Physical Therapy

## 2020-08-12 ENCOUNTER — Encounter: Payer: Self-pay | Admitting: Physician Assistant

## 2020-08-13 ENCOUNTER — Encounter: Payer: 59 | Admitting: Physical Therapy

## 2020-08-31 MED FILL — LANTUS SOLOSTAR 100 UNITS/M: 100 | 30 days supply | Qty: 9 | Fill #1

## 2020-08-31 MED FILL — VICTOZA 18 MG/3 ML INJECT P: 18 | 30 days supply | Qty: 9 | Fill #1

## 2020-09-09 ENCOUNTER — Ambulatory Visit: Payer: 59 | Attending: Nurse Practitioner | Admitting: Nurse Practitioner

## 2020-09-09 ENCOUNTER — Encounter: Payer: Self-pay | Admitting: Nurse Practitioner

## 2020-09-09 ENCOUNTER — Other Ambulatory Visit: Payer: Self-pay

## 2020-09-09 DIAGNOSIS — E782 Mixed hyperlipidemia: Secondary | ICD-10-CM

## 2020-09-09 DIAGNOSIS — I10 Essential (primary) hypertension: Secondary | ICD-10-CM

## 2020-09-09 DIAGNOSIS — E1165 Type 2 diabetes mellitus with hyperglycemia: Secondary | ICD-10-CM | POA: Diagnosis not present

## 2020-09-09 DIAGNOSIS — E118 Type 2 diabetes mellitus with unspecified complications: Secondary | ICD-10-CM | POA: Diagnosis not present

## 2020-09-09 DIAGNOSIS — IMO0002 Reserved for concepts with insufficient information to code with codable children: Secondary | ICD-10-CM

## 2020-09-09 MED ORDER — VICTOZA 18 MG/3ML ~~LOC~~ SOPN: 1.8000 mg | PEN_INJECTOR | Freq: Every day | SUBCUTANEOUS | 3 refills | Status: DC

## 2020-09-09 MED ORDER — ONETOUCH DELICA PLUS LANCET33G MISC: 2 refills | Status: DC

## 2020-09-09 MED ORDER — BD PEN NEEDLE MINI U/F 31G X 5 MM MISC: 6 refills | Status: DC

## 2020-09-09 MED ORDER — LANTUS SOLOSTAR 100 UNIT/ML ~~LOC~~ SOPN: 30.0000 [IU] | PEN_INJECTOR | Freq: Every day | SUBCUTANEOUS | 3 refills | Status: DC

## 2020-09-09 MED ORDER — NOVOLOG FLEXPEN 100 UNIT/ML ~~LOC~~ SOPN: 10.0000 [IU] | PEN_INJECTOR | Freq: Three times a day (TID) | SUBCUTANEOUS | 3 refills | Status: DC

## 2020-09-09 MED ORDER — LOSARTAN POTASSIUM 100 MG PO TABS: 100.0000 mg | ORAL_TABLET | Freq: Every day | ORAL | 1 refills | Status: DC

## 2020-09-09 MED ORDER — ATORVASTATIN CALCIUM 20 MG PO TABS: 20.0000 mg | ORAL_TABLET | Freq: Every day | ORAL | 3 refills | Status: DC

## 2020-09-09 MED ORDER — ONETOUCH VERIO VI STRP: ORAL_STRIP | 2 refills | Status: DC

## 2020-09-09 MED FILL — LOSARTAN POTASSIUM 100 MG T: 100 | 90 days supply | Qty: 90 | Fill #0

## 2020-09-09 MED FILL — ONETOUCH DELICA PLUS LANCET: 33 days supply | Qty: 100 | Fill #0

## 2020-09-09 MED FILL — BD PEN NDL MINI 31GX5MM: 31G X 5 MM | 50 days supply | Qty: 200 | Fill #0

## 2020-09-09 MED FILL — NOVOLOG FLEXPEN SYRINGE: 100 | 30 days supply | Qty: 9 | Fill #0

## 2020-09-09 MED FILL — ONE TOUCH VERIO TEST STRIP: 33 days supply | Qty: 100 | Fill #0

## 2020-09-09 MED FILL — ATORVASTATIN 20 MG TABLET: 20 | 90 days supply | Qty: 90 | Fill #0
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
## 2020-09-10 ENCOUNTER — Encounter: Payer: Self-pay | Admitting: Nurse Practitioner

## 2020-09-15 ENCOUNTER — Ambulatory Visit: Payer: 59 | Admitting: Physician Assistant

## 2020-09-16 ENCOUNTER — Other Ambulatory Visit: Payer: 59

## 2020-09-23 ENCOUNTER — Other Ambulatory Visit: Payer: Self-pay | Admitting: Nurse Practitioner

## 2020-09-23 ENCOUNTER — Other Ambulatory Visit: Payer: 59

## 2020-09-23 DIAGNOSIS — E1165 Type 2 diabetes mellitus with hyperglycemia: Secondary | ICD-10-CM

## 2020-09-23 DIAGNOSIS — D649 Anemia, unspecified: Secondary | ICD-10-CM

## 2020-09-29 ENCOUNTER — Ambulatory Visit: Payer: 59 | Attending: Nurse Practitioner

## 2020-09-29 ENCOUNTER — Other Ambulatory Visit: Payer: Self-pay

## 2020-09-29 DIAGNOSIS — E1165 Type 2 diabetes mellitus with hyperglycemia: Secondary | ICD-10-CM

## 2020-09-29 DIAGNOSIS — Z794 Long term (current) use of insulin: Secondary | ICD-10-CM

## 2020-09-29 DIAGNOSIS — D649 Anemia, unspecified: Secondary | ICD-10-CM

## 2020-09-30 LAB — CMP14+EGFR
ALT: 35 IU/L (ref 0–44)
AST: 48 IU/L — ABNORMAL HIGH (ref 0–40)
Albumin/Globulin Ratio: 1.1 — ABNORMAL LOW (ref 1.2–2.2)
Albumin: 4.2 g/dL (ref 4.0–5.0)
Alkaline Phosphatase: 253 IU/L — ABNORMAL HIGH (ref 44–121)
BUN/Creatinine Ratio: 5 — ABNORMAL LOW (ref 9–20)
BUN: 4 mg/dL — ABNORMAL LOW (ref 6–24)
Bilirubin Total: 0.5 mg/dL (ref 0.0–1.2)
CO2: 25 mmol/L (ref 20–29)
Calcium: 9.5 mg/dL (ref 8.7–10.2)
Chloride: 101 mmol/L (ref 96–106)
Creatinine, Ser: 0.85 mg/dL (ref 0.76–1.27)
GFR calc Af Amer: 122 mL/min/{1.73_m2} (ref >59)
GFR calc non Af Amer: 105 mL/min/{1.73_m2} (ref >59)
Globulin, Total: 3.7 g/dL (ref 1.5–4.5)
Glucose: 143 mg/dL — ABNORMAL HIGH (ref 65–99)
Potassium: 4.3 mmol/L (ref 3.5–5.2)
Sodium: 137 mmol/L (ref 134–144)
Total Protein: 7.9 g/dL (ref 6.0–8.5)

## 2020-09-30 LAB — CBC
Hematocrit: 38.1 % (ref 37.5–51.0)
Hemoglobin: 13.2 g/dL (ref 13.0–17.7)
MCH: 34.6 pg — ABNORMAL HIGH (ref 26.6–33.0)
MCHC: 34.6 g/dL (ref 31.5–35.7)
MCV: 100 fL — ABNORMAL HIGH (ref 79–97)
Platelets: 207 10*3/uL (ref 150–450)
RBC: 3.82 x10E6/uL — ABNORMAL LOW (ref 4.14–5.80)
RDW: 12.3 % (ref 11.6–15.4)
WBC: 3.6 10*3/uL (ref 3.4–10.8)

## 2020-09-30 LAB — HEMOGLOBIN A1C
Est. average glucose Bld gHb Est-mCnc: 186 mg/dL
Hgb A1c MFr Bld: 8.1 % — ABNORMAL HIGH (ref 4.8–5.6)

## 2020-10-12 MED FILL — NOVOLOG FLEXPEN SYRINGE: 100 | 30 days supply | Qty: 9 | Fill #0

## 2020-10-12 MED FILL — LANTUS SOLOSTAR 100 UNITS/M: 100 | 30 days supply | Qty: 9 | Fill #2

## 2020-10-13 NOTE — Progress Notes (Unsigned)
S:     PCP: Bertram Denver, NP PMH: DM, HLD, HTN, Pancreatitis, alcohol abuse, depression, left BKA  Patient arrives in good spirits. Presents for diabetes evaluation, education, and management. Patient was referred and last seen by Primary Care Provider on 09/09/20 at which patient reported 2 episodes of hypoglycemia and average fasting BG readings of 170-180s. No medication changes were made at that visit. As of November, A1C increased from 6.9% to 8.1% and Lantus was increased from 30 units to 40 units daily.    Labs: LDL 93 as of 02/04/20 - on statin UACR 40  - on ARB  A1C = 8.1% as of November, previously 6.9% - July 2021 - well controled Check Clinic BG Review medications and adherence (timing of meds, etc.)  Ate or drank anything prior to visit today? At home BGs?  Marland Kitchen Highs . Lows  Hyperglycemia sx (nocturia, neuropathy, visual changes, foot exams) Hypoglycemia symptoms (dizziness, shaky, sweating, hungry, confusion) Diet Exercise  Plan: titrate insulin   Patient reports Diabetes was diagnosed in ***.   Family/Social History:  -Fhx: mother with arthritis, father with T2DM -Tobacco use: former smoker (quit 1997); hx of alcohol abuse - currently drinking 4 cans of beer per week; hx of marijuana use   Insurance coverage/medication affordability: [facility] [employer] and [place] medicaid  Medication adherence reported *** .   Current diabetes medications include: Lantus 40 units daily, Victoza 1.8 mg daily, Novolog 10 units TID w/meals Current hypertension medications include: losartan 100 mg daily Current hyperlipidemia medications include: atorvastatin 20 mg daily  Patient {Actions; denies-reports:120008} hypoglycemic events.  Patient reported dietary habits: Eats *** meals/day Breakfast:*** Lunch:*** Dinner:*** Snacks:*** Drinks:***  Patient-reported exercise habits: ***   Patient {Actions; denies-reports:120008} nocturia (nighttime urination).  Patient {Actions;  denies-reports:120008} neuropathy (nerve pain). Patient {Actions; denies-reports:120008} visual changes. Patient {Actions; denies-reports:120008} self foot exams.     O:    Lab Results  Component Value Date   HGBA1C 8.1 (H) 09/29/2020   There were no vitals filed for this visit.  Lipid Panel     Component Value Date/Time   CHOL 207 (H) 02/04/2020 1008   TRIG 61 02/04/2020 1008   HDL 103 02/04/2020 1008   CHOLHDL 2.0 02/04/2020 1008   CHOLHDL 2.0 04/05/2011 0634   VLDL 12 04/05/2011 0634   LDLCALC 93 02/04/2020 1008    Home fasting blood sugars: ***  2 hour post-meal/random blood sugars: ***.   Clinical Atherosclerotic Cardiovascular Disease (ASCVD): No  The ASCVD Risk score Denman George DC Jr., et al., 2013) failed to calculate for the following reasons:   The valid HDL cholesterol range is 20 to 100 mg/dL    A/P: Diabetes longstanding*** currently ***. Patient is *** able to verbalize appropriate hypoglycemia management plan. Medication adherence appears ***. Control is suboptimal due to ***. -{Meds adjust:18428} basal insulin *** (insulin ***). Patient will continue to titrate 1 unit every *** days if fasting blood sugar > 100mg /dl until fasting blood sugars reach goal or next visit.  -{Meds adjust:18428}  rapid insulin *** (insulin ***) to ***.  -{Meds adjust:18428} GLP-1 *** (generic name***) to ***.  -{Meds adjust:18428} SGLT2-I *** (generic name***) to ***. Counseled on sick day rules for ***. -Extensively discussed pathophysiology of diabetes, recommended lifestyle interventions, dietary effects on blood sugar control -Counseled on s/sx of and management of hypoglycemia -Next A1C anticipated ***.   ASCVD risk - primary***secondary prevention in patient with diabetes. Last LDL {Is/is not:9024} controlled. ASCVD risk score {Is/is not:9024} >20%  - {  Desc; low/moderate/high:110033} intensity statin indicated. Aspirin {Is/is not:9024} indicated.  -{Meds adjust:18428} aspirin  *** mg  -{Meds adjust:18428} ***statin *** mg.   Hypertension longstanding*** currently ***.  Blood pressure goal = *** mmHg. Medication adherence ***.  Blood pressure control is suboptimal due to ***. -***  Written patient instructions provided.  Total time in face to face counseling *** minutes.   Follow up Pharmacist/PCP*** Clinic Visit in ***.    Fabio Neighbors, PharmD PGY2 Ambulatory Care Resident [facility]  Pharmacy

## 2020-10-14 ENCOUNTER — Ambulatory Visit: Payer: 59 | Admitting: Pharmacist

## 2020-10-21 ENCOUNTER — Other Ambulatory Visit: Payer: Self-pay | Admitting: Nurse Practitioner

## 2020-10-21 ENCOUNTER — Other Ambulatory Visit: Payer: 59

## 2020-10-21 DIAGNOSIS — R7401 Elevation of levels of liver transaminase levels: Secondary | ICD-10-CM

## 2020-10-27 MED FILL — NOVOLOG FLEXPEN SYRINGE: 100 | 30 days supply | Qty: 9 | Fill #1

## 2020-10-27 MED FILL — VICTOZA 18 MG/3 ML INJECT P: 18 | 30 days supply | Qty: 9 | Fill #2

## 2020-10-28 ENCOUNTER — Other Ambulatory Visit: Payer: Self-pay

## 2020-10-28 ENCOUNTER — Ambulatory Visit: Payer: 59 | Attending: Nurse Practitioner

## 2020-10-28 DIAGNOSIS — R7401 Elevation of levels of liver transaminase levels: Secondary | ICD-10-CM

## 2020-10-29 LAB — HEPATIC FUNCTION PANEL
ALT: 30 IU/L (ref 0–44)
AST: 56 IU/L — ABNORMAL HIGH (ref 0–40)
Albumin: 4.5 g/dL (ref 4.0–5.0)
Alkaline Phosphatase: 225 IU/L — ABNORMAL HIGH (ref 44–121)
Bilirubin Total: 0.4 mg/dL (ref 0.0–1.2)
Bilirubin, Direct: 0.12 mg/dL (ref 0.00–0.40)
Total Protein: 8.5 g/dL (ref 6.0–8.5)

## 2020-10-29 LAB — GAMMA GT: GGT: 171 IU/L — ABNORMAL HIGH (ref 0–65)

## 2020-10-29 LAB — HEPATITIS PANEL, ACUTE
Hep A IgM: NEGATIVE
Hep B C IgM: NEGATIVE
Hep C Virus Ab: <0.1 s/co ratio (ref 0.0–0.9)
Hepatitis B Surface Ag: NEGATIVE

## 2020-12-11 ENCOUNTER — Ambulatory Visit: Payer: 59 | Admitting: Nurse Practitioner

## 2020-12-11 ENCOUNTER — Ambulatory Visit: Payer: Self-pay | Attending: Nurse Practitioner

## 2020-12-11 ENCOUNTER — Other Ambulatory Visit: Payer: Self-pay

## 2020-12-11 MED FILL — !NOVOLOG FLEXPEN SYRINGE 1: 100/ML | 30 days supply | Qty: 9 | Fill #0

## 2020-12-11 MED FILL — !LANTUS SOLOSTAR 100UNITS/M: 100 | 30 days supply | Qty: 9 | Fill #3

## 2020-12-11 MED FILL — !VICTOZA 18MG/3ML INJECT: 18 | 30 days supply | Qty: 9 | Fill #2

## 2020-12-30 ENCOUNTER — Other Ambulatory Visit: Payer: Self-pay | Admitting: Nurse Practitioner

## 2020-12-30 ENCOUNTER — Ambulatory Visit: Payer: Medicaid Other

## 2020-12-30 ENCOUNTER — Other Ambulatory Visit: Payer: Self-pay

## 2020-12-30 ENCOUNTER — Ambulatory Visit: Payer: Self-pay

## 2020-12-30 ENCOUNTER — Ambulatory Visit: Payer: Self-pay | Attending: Nurse Practitioner | Admitting: Nurse Practitioner

## 2020-12-30 ENCOUNTER — Encounter: Payer: Self-pay | Admitting: Nurse Practitioner

## 2020-12-30 VITALS — BP 126/85 | HR 93 | Temp 98.2°F | Ht 73.0 in | Wt 222.0 lb

## 2020-12-30 DIAGNOSIS — M1990 Unspecified osteoarthritis, unspecified site: Secondary | ICD-10-CM | POA: Insufficient documentation

## 2020-12-30 DIAGNOSIS — E1165 Type 2 diabetes mellitus with hyperglycemia: Secondary | ICD-10-CM | POA: Insufficient documentation

## 2020-12-30 DIAGNOSIS — Z760 Encounter for issue of repeat prescription: Secondary | ICD-10-CM | POA: Insufficient documentation

## 2020-12-30 DIAGNOSIS — E785 Hyperlipidemia, unspecified: Secondary | ICD-10-CM | POA: Insufficient documentation

## 2020-12-30 DIAGNOSIS — Z833 Family history of diabetes mellitus: Secondary | ICD-10-CM | POA: Insufficient documentation

## 2020-12-30 DIAGNOSIS — Z79899 Other long term (current) drug therapy: Secondary | ICD-10-CM | POA: Insufficient documentation

## 2020-12-30 DIAGNOSIS — I10 Essential (primary) hypertension: Secondary | ICD-10-CM | POA: Insufficient documentation

## 2020-12-30 DIAGNOSIS — Z794 Long term (current) use of insulin: Secondary | ICD-10-CM

## 2020-12-30 DIAGNOSIS — R718 Other abnormality of red blood cells: Secondary | ICD-10-CM

## 2020-12-30 DIAGNOSIS — Z7984 Long term (current) use of oral hypoglycemic drugs: Secondary | ICD-10-CM | POA: Insufficient documentation

## 2020-12-30 DIAGNOSIS — Z89512 Acquired absence of left leg below knee: Secondary | ICD-10-CM | POA: Insufficient documentation

## 2020-12-30 DIAGNOSIS — Z8261 Family history of arthritis: Secondary | ICD-10-CM | POA: Insufficient documentation

## 2020-12-30 DIAGNOSIS — Z1211 Encounter for screening for malignant neoplasm of colon: Secondary | ICD-10-CM

## 2020-12-30 LAB — POCT GLYCOSYLATED HEMOGLOBIN (HGB A1C): Hemoglobin A1C: 7.5 % — AB (ref 4.0–5.6)

## 2020-12-30 LAB — GLUCOSE, POCT (MANUAL RESULT ENTRY): POC Glucose: 125 mg/dl — AB (ref 70–99)

## 2020-12-30 MED ORDER — TRUE METRIX BLOOD GLUCOSE TEST VI STRP: ORAL_STRIP | 12 refills | Status: DC

## 2020-12-30 MED ORDER — TRUEPLUS LANCETS 28G MISC: 3 refills | Status: DC

## 2020-12-30 MED ORDER — LOSARTAN POTASSIUM 100 MG PO TABS: 100.0000 mg | ORAL_TABLET | Freq: Every day | ORAL | 1 refills | Status: DC

## 2020-12-30 MED ORDER — TRUE METRIX METER W/DEVICE KIT: PACK | 0 refills | Status: DC

## 2020-12-30 MED FILL — LOSARTAN POTASSIUM 100 MG T: 100 | 30 days supply | Qty: 30 | Fill #0

## 2020-12-30 MED FILL — TRUEplus LANCETS 28G MISC: 50 days supply | Qty: 100 | Fill #0

## 2020-12-30 MED FILL — TRUE METRIX GLUCOSE TEST ST: 50 days supply | Qty: 100 | Fill #0

## 2020-12-30 MED FILL — !TRUE METRIX BLOOD GLUCOSE: 1 days supply | Qty: 1 | Fill #0
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
## 2020-12-31 ENCOUNTER — Telehealth: Payer: Self-pay | Admitting: Nurse Practitioner

## 2020-12-31 LAB — CMP14+EGFR
ALT: 45 IU/L — ABNORMAL HIGH (ref 0–44)
AST: 45 IU/L — ABNORMAL HIGH (ref 0–40)
Albumin/Globulin Ratio: 1.1 — ABNORMAL LOW (ref 1.2–2.2)
Albumin: 4.2 g/dL (ref 4.0–5.0)
Alkaline Phosphatase: 173 IU/L — ABNORMAL HIGH (ref 44–121)
BUN/Creatinine Ratio: 8 — ABNORMAL LOW (ref 9–20)
BUN: 6 mg/dL (ref 6–24)
Bilirubin Total: 0.4 mg/dL (ref 0.0–1.2)
CO2: 22 mmol/L (ref 20–29)
Calcium: 9.6 mg/dL (ref 8.7–10.2)
Chloride: 103 mmol/L (ref 96–106)
Creatinine, Ser: 0.77 mg/dL (ref 0.76–1.27)
GFR calc Af Amer: 127 mL/min/{1.73_m2} (ref >59)
GFR calc non Af Amer: 110 mL/min/{1.73_m2} (ref >59)
Globulin, Total: 3.9 g/dL (ref 1.5–4.5)
Glucose: 114 mg/dL — ABNORMAL HIGH (ref 65–99)
Potassium: 4.8 mmol/L (ref 3.5–5.2)
Sodium: 140 mmol/L (ref 134–144)
Total Protein: 8.1 g/dL (ref 6.0–8.5)

## 2020-12-31 LAB — CBC
Hematocrit: 39.5 % (ref 37.5–51.0)
Hemoglobin: 14.1 g/dL (ref 13.0–17.7)
MCH: 35.2 pg — ABNORMAL HIGH (ref 26.6–33.0)
MCHC: 35.7 g/dL (ref 31.5–35.7)
MCV: 99 fL — ABNORMAL HIGH (ref 79–97)
Platelets: 291 10*3/uL (ref 150–450)
RBC: 4.01 x10E6/uL — ABNORMAL LOW (ref 4.14–5.80)
RDW: 11.7 % (ref 11.6–15.4)
WBC: 4.5 10*3/uL (ref 3.4–10.8)

## 2020-12-31 NOTE — Telephone Encounter (Signed)
Contacted pt to ask about his covid vaccine status. Pt is not vaccinated. A Offered appt for the covid vaccine and pt accepted! Pt has appt next Tues at [facility].

## 2021-01-05 ENCOUNTER — Ambulatory Visit: Payer: Medicaid Other

## 2021-01-05 NOTE — Addendum Note (Signed)
Addended byVidal Schwalbe on: 01/05/2021 04:51 PM   Modules accepted: Orders

## 2021-01-09 LAB — FECAL OCCULT BLOOD, IMMUNOCHEMICAL: Fecal Occult Bld: NEGATIVE

## 2021-01-14 ENCOUNTER — Ambulatory Visit (HOSPITAL_COMMUNITY): Payer: Self-pay

## 2021-01-19 ENCOUNTER — Ambulatory Visit: Payer: Self-pay | Admitting: *Deleted

## 2021-01-19 NOTE — Telephone Encounter (Signed)
Contacted pt regarding his concerns; he says for the past 2 days his penis has been itching; it occurred after intercourse; his fiance is currently being treated for a has a vaginal infection; he says it is not  a STI; the affected area is the bottom tip of his penis; he describes his discomfort as mild and intermittent;recommendations made per nurse triage protocol; he verbalized understanding; the pt says he has an appt for a BP check on 01/20/21 at 1100; he would like an appt for that time; decision tree completed; the pt sees Bertram Denver but she has no availability; pt offered and accepted appt with Dr Shan Levans, 01/21/21 at 0830; will route to office for notification of encounter and pt request.  Reason for Disposition . All other penis - scrotum symptoms  (Exception: painless rash < 24 hours duration)  Answer Assessment - Initial Assessment Questions 1. DESCRIPTION: "Describe the itching you are having." "Where is it located?"     Bottom tip of penis  2. SEVERITY: "How bad is it?"    - MILD - doesn't interfere with normal activities   - MODERATE-SEVERE: interferes with work, school, sleep, or other activities      mild 3. SCRATCHING: "Are there any scratch marks? Bleeding?"    Yes; no bleeeding 4. ONSET: "When did the itching begin?"      01/17/21 5. CAUSE: "What do you think is causing the itching?"    Fiance has vaginal infection 6. OTHER SYMPTOMS: "Do you have any other symptoms?"      no 7. PREGNANCY: "Is there any chance you are pregnant?" "When was your last menstrual period?"     n/a  Protocols used: PENIS AND SCROTUM SYMPTOMS-A-AH, ITCHING - LOCALIZED-A-AH

## 2021-01-19 NOTE — Progress Notes (Signed)
   S:     PCP: Bertram Denver PMH: HTN, DM, HLD, hx pancreatitis, alcohol abuse, depression, left BKA  Patient arrives in good spirits. Presents to the clinic for hypertension evaluation, counseling, and management. Patient was referred and last seen by PCP on 12/30/20. At that visit, BP was elevated at 144/92, however, pt had been without losartan for several weeks. Pt was provided with refills for losartan 100 mg daily.  Today, patient reports medication adherence with losartan daily. Admits to missing 2 doses in the last 2 weeks. Denies any side effects or intolerances. Reports home BP range as 135-140/85-117. Denies dizziness, headaches, and LE swelling. Reports blurred vision secondary due to diabetes and plans to see an eye doctor once medicaid is approved. Discussed current diet and exercise regimen (see below).  Medication adherence good.  Current BP Medications include: losartan 100 mg daily (AM)  Dietary habits include:  -Breakfast: bacon or sausage -Lunch: salad with bacon bits, cheese, cucumber, tomatoes, and onions; bologna sandwich -Dinner: baked chicken, baked pork chops, fried food once a week, chicken patties, fried chicken steaks, hot dogs -Drinks: water, diet soda/juice, coffee seldomly  -Snacks: honey bun, chips  Exercise habits include: walks to store 2-3x/day.  Family / Social history:  -Fhx: DM in mother -Tobacco use: former smoker (quit 1997)  O:  Vitals:   01/20/21 1121  BP: (!) 162/107  Pulse: 90   Home BP readings: 135-140/85-117  Last 3 Office BP readings: BP Readings from Last 3 Encounters:  01/20/21 (!) 162/107  12/30/20 126/85  06/09/20 (!) 139/97    BMET    Component Value Date/Time   NA 140 12/30/2020 1148   K 4.8 12/30/2020 1148   CL 103 12/30/2020 1148   CO2 22 12/30/2020 1148   GLUCOSE 114 (H) 12/30/2020 1148   GLUCOSE 251 (H) 05/29/2020 0811   BUN 6 12/30/2020 1148   CREATININE 0.77 12/30/2020 1148   CALCIUM 9.6 12/30/2020 1148    GFRNONAA 110 12/30/2020 1148   GFRAA 127 12/30/2020 1148    Renal function: CrCl cannot be calculated (Patient's most recent lab result is older than the maximum 21 days allowed.).  Clinical ASCVD: No  The ASCVD Risk score Denman George DC Jr., et al., 2013) failed to calculate for the following reasons:   The valid HDL cholesterol range is 20 to 100 mg/dL   A/P: Hypertension longstanding currently uncontrolled on current medications. BP Goal = <130/80 mmHg. Medication adherence appears fair. Encouraged patient to continue to check BP at home daily at least 1 hour after medications. Encouraged patient to aim for a diet full of vegetables, fruit and lean meats (chicken, [place], fish) and to limit salt intake by eating fresh or frozen vegetables (instead of canned), rinse canned vegetables prior to cooking and do not add any additional salt to meals. Encouraged patient to exercise 20-30 minutes daily with the goal of 150 minutes per week. Patient verbalized understanding. -Started amlodipine 5 mg daily -Continued losartan 100 mg daily -Counseled on lifestyle modifications for blood pressure control including reduced dietary sodium, increased exercise, adequate sleep.  Results reviewed and written information provided. Total time in face-to-face counseling 25 minutes.   F/U Clinic Visit in 4 weeks.    Fabio Neighbors, PharmD, BCPS PGY2 Ambulatory Care Resident [facility]

## 2021-01-20 ENCOUNTER — Ambulatory Visit: Payer: Self-pay | Attending: Nurse Practitioner | Admitting: Pharmacist

## 2021-01-20 ENCOUNTER — Other Ambulatory Visit: Payer: Self-pay | Admitting: Nurse Practitioner

## 2021-01-20 ENCOUNTER — Encounter: Payer: Self-pay | Admitting: Pharmacist

## 2021-01-20 ENCOUNTER — Other Ambulatory Visit: Payer: Self-pay

## 2021-01-20 VITALS — BP 162/107 | HR 90

## 2021-01-20 DIAGNOSIS — I10 Essential (primary) hypertension: Secondary | ICD-10-CM

## 2021-01-20 MED ORDER — AMLODIPINE BESYLATE 5 MG PO TABS: 5.0000 mg | ORAL_TABLET | Freq: Every day | ORAL | 2 refills | Status: DC

## 2021-01-20 MED FILL — AMLODIPINE BESYLATE 5 MG TA: 5 | 30 days supply | Qty: 30 | Fill #0

## 2021-01-21 ENCOUNTER — Ambulatory Visit: Payer: Medicaid Other | Admitting: Critical Care Medicine

## 2021-01-27 ENCOUNTER — Other Ambulatory Visit: Payer: Self-pay

## 2021-01-27 ENCOUNTER — Encounter (HOSPITAL_COMMUNITY): Payer: Self-pay

## 2021-01-27 ENCOUNTER — Ambulatory Visit (HOSPITAL_COMMUNITY)
Admission: RE | Admit: 2021-01-27 | Discharge: 2021-01-27 | Disposition: A | Discharge status: Home / Self Care | Payer: Self-pay | Source: Ambulatory Visit | Attending: Nurse Practitioner | Admitting: Nurse Practitioner

## 2021-01-27 DIAGNOSIS — Z794 Long term (current) use of insulin: Secondary | ICD-10-CM | POA: Insufficient documentation

## 2021-01-27 DIAGNOSIS — E1165 Type 2 diabetes mellitus with hyperglycemia: Secondary | ICD-10-CM | POA: Insufficient documentation

## 2021-01-27 MED ORDER — IOHEXOL 300 MG/ML  SOLN
100.0000 mL | Freq: Once | INTRAMUSCULAR | Status: AC | PRN
  Administered 2021-01-27: 100 mL via INTRAVENOUS

## 2021-01-29 MED FILL — !LANTUS SOLOSTAR 100UNITS/M: 100 | 30 days supply | Qty: 9 | Fill #4

## 2021-01-29 MED FILL — !NOVOLOG FLEXPEN SYRINGE 1: 100/ML | 30 days supply | Qty: 9 | Fill #1

## 2021-01-29 MED FILL — !VICTOZA 18MG/3ML INJECT: 18 | 30 days supply | Qty: 9 | Fill #3

## 2021-01-29 MED FILL — AMLODIPINE BESYLATE 5 MG TA: 5 | 30 days supply | Qty: 30 | Fill #0

## 2021-02-17 ENCOUNTER — Ambulatory Visit: Payer: Medicaid Other | Admitting: Pharmacist

## 2021-02-27 ENCOUNTER — Other Ambulatory Visit: Payer: Self-pay

## 2021-03-04 ENCOUNTER — Other Ambulatory Visit: Payer: Self-pay

## 2021-03-04 MED FILL — Liraglutide Soln Pen-injector 18 MG/3ML (6 MG/ML): SUBCUTANEOUS | 30 days supply | Qty: 6 | Fill #0 | Status: CN

## 2021-03-04 MED FILL — Losartan Potassium Tab 100 MG: ORAL | 30 days supply | Qty: 30 | Fill #0 | Status: AC

## 2021-03-04 MED FILL — Insulin Glargine Soln Pen-Injector 100 Unit/ML: SUBCUTANEOUS | 26 days supply | Qty: 9 | Fill #0 | Status: AC

## 2021-03-04 MED FILL — Liraglutide Soln Pen-injector 18 MG/3ML (6 MG/ML): SUBCUTANEOUS | 30 days supply | Qty: 9 | Fill #0 | Status: AC

## 2021-03-04 MED FILL — Amlodipine Besylate Tab 5 MG (Base Equivalent): ORAL | 30 days supply | Qty: 30 | Fill #0 | Status: AC

## 2021-03-05 ENCOUNTER — Other Ambulatory Visit: Payer: Self-pay

## 2021-03-30 ENCOUNTER — Ambulatory Visit: Payer: Medicaid Other | Admitting: Family Medicine

## 2021-03-30 ENCOUNTER — Ambulatory Visit: Payer: Medicaid Other | Admitting: Nurse Practitioner

## 2021-05-17 ENCOUNTER — Other Ambulatory Visit: Payer: Self-pay

## 2021-05-17 MED FILL — Insulin Glargine Soln Pen-Injector 100 Unit/ML: SUBCUTANEOUS | 26 days supply | Qty: 9 | Fill #1 | Status: CN

## 2021-05-17 MED FILL — Amlodipine Besylate Tab 5 MG (Base Equivalent): ORAL | 30 days supply | Qty: 30 | Fill #1 | Status: AC

## 2021-05-17 MED FILL — Insulin Glargine Soln Pen-Injector 100 Unit/ML: SUBCUTANEOUS | 20 days supply | Qty: 6 | Fill #1 | Status: AC

## 2021-05-17 MED FILL — Losartan Potassium Tab 100 MG: ORAL | 30 days supply | Qty: 30 | Fill #1 | Status: AC

## 2021-05-17 MED FILL — Liraglutide Soln Pen-injector 18 MG/3ML (6 MG/ML): SUBCUTANEOUS | 30 days supply | Qty: 9 | Fill #1 | Status: AC

## 2021-05-19 ENCOUNTER — Other Ambulatory Visit: Payer: Self-pay

## 2021-05-19 MED ORDER — NOVOLOG FLEXPEN 100 UNIT/ML ~~LOC~~ SOPN
PEN_INJECTOR | SUBCUTANEOUS | 3 refills | Status: DC
  Filled 2021-05-19: qty 9, 30d supply, fill #0
  Filled 2021-07-09: qty 9, 30d supply, fill #1

## 2021-05-20 ENCOUNTER — Other Ambulatory Visit: Payer: Self-pay

## 2021-05-26 ENCOUNTER — Other Ambulatory Visit: Payer: Self-pay

## 2021-06-14 ENCOUNTER — Ambulatory Visit: Payer: Self-pay | Attending: Nurse Practitioner | Admitting: Family Medicine

## 2021-06-14 ENCOUNTER — Other Ambulatory Visit: Payer: Self-pay

## 2021-06-14 ENCOUNTER — Encounter: Payer: Self-pay | Admitting: Family Medicine

## 2021-06-14 VITALS — BP 117/79 | HR 92 | Ht 73.0 in | Wt 225.8 lb

## 2021-06-14 DIAGNOSIS — IMO0002 Reserved for concepts with insufficient information to code with codable children: Secondary | ICD-10-CM

## 2021-06-14 DIAGNOSIS — E118 Type 2 diabetes mellitus with unspecified complications: Secondary | ICD-10-CM

## 2021-06-14 DIAGNOSIS — E1165 Type 2 diabetes mellitus with hyperglycemia: Secondary | ICD-10-CM

## 2021-06-14 DIAGNOSIS — L84 Corns and callosities: Secondary | ICD-10-CM

## 2021-06-14 DIAGNOSIS — I10 Essential (primary) hypertension: Secondary | ICD-10-CM

## 2021-06-14 DIAGNOSIS — Z794 Long term (current) use of insulin: Secondary | ICD-10-CM

## 2021-06-14 LAB — GLUCOSE, POCT (MANUAL RESULT ENTRY): POC Glucose: 173 mg/dl — AB (ref 70–99)

## 2021-06-14 LAB — POCT GLYCOSYLATED HEMOGLOBIN (HGB A1C): HbA1c, POC (controlled diabetic range): 8.1 % — AB (ref 0.0–7.0)

## 2021-06-14 MED ORDER — AMLODIPINE BESYLATE 5 MG PO TABS
ORAL_TABLET | Freq: Every day | ORAL | 2 refills | Status: DC
  Filled 2021-06-14: qty 30, 30d supply, fill #0

## 2021-06-14 MED ORDER — INSULIN GLARGINE 100 UNIT/ML SOLOSTAR PEN
PEN_INJECTOR | SUBCUTANEOUS | 3 refills | Status: DC
  Filled 2021-06-14: qty 9, 30d supply, fill #0

## 2021-06-14 MED ORDER — DAPAGLIFLOZIN PROPANEDIOL 10 MG PO TABS
10.0000 mg | ORAL_TABLET | Freq: Every day | ORAL | 6 refills | Status: DC
Start: 2021-06-14 — End: 2022-04-04
  Filled 2021-06-14: qty 30, 30d supply, fill #0

## 2021-06-14 NOTE — Patient Instructions (Signed)
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
## 2021-06-15 LAB — LIPID PANEL
Chol/HDL Ratio: 2.6 ratio (ref 0.0–5.0)
Cholesterol, Total: 161 mg/dL (ref 100–199)
HDL: 62 mg/dL (ref >39)
LDL Chol Calc (NIH): 87 mg/dL (ref 0–99)
Triglycerides: 62 mg/dL (ref 0–149)
VLDL Cholesterol Cal: 12 mg/dL (ref 5–40)

## 2021-06-18 ENCOUNTER — Telehealth: Payer: Self-pay

## 2021-06-18 NOTE — Telephone Encounter (Signed)
-----   Message from Hoy Register, MD sent at 06/15/2021  8:16 AM EDT ----- Please inform the patient that labs are normal. Thank you.

## 2021-06-18 NOTE — Telephone Encounter (Signed)
Pt was called and informed of normal lab results via VM. 

## 2021-06-21 ENCOUNTER — Other Ambulatory Visit: Payer: Self-pay

## 2021-07-09 ENCOUNTER — Other Ambulatory Visit: Payer: Self-pay

## 2021-07-09 ENCOUNTER — Other Ambulatory Visit: Payer: Self-pay | Admitting: Nurse Practitioner

## 2021-07-09 DIAGNOSIS — E1165 Type 2 diabetes mellitus with hyperglycemia: Secondary | ICD-10-CM

## 2021-07-09 DIAGNOSIS — IMO0002 Reserved for concepts with insufficient information to code with codable children: Secondary | ICD-10-CM

## 2021-07-09 MED ORDER — VICTOZA 18 MG/3ML ~~LOC~~ SOPN
PEN_INJECTOR | Freq: Every day | SUBCUTANEOUS | 3 refills | Status: DC
  Filled 2021-07-09: qty 9, 30d supply, fill #0
  Filled 2021-09-16: qty 9, 30d supply, fill #1

## 2021-07-09 NOTE — Telephone Encounter (Signed)
  Notes to clinic:  This request has changes from the previous prescription Review for change    Requested Prescriptions  Pending Prescriptions Disp Refills   liraglutide (VICTOZA) 18 MG/3ML SOPN 30 mL 3    Sig: INJECT 0.3 MLS (1.8 MG TOTAL) INTO THE SKIN DAILY.     Endocrinology:  Diabetes - GLP-1 Receptor Agonists Failed - 07/09/2021  9:02 AM      Failed - HBA1C is between 0 and 7.9 and within 180 days    HbA1c, POC (controlled diabetic range)  Date Value Ref Range Status  06/14/2021 8.1 (A) 0.0 - 7.0 % Final          Passed - Valid encounter within last 6 months    Recent Outpatient Visits           3 weeks ago Type 2 diabetes mellitus with hyperglycemia, with long-term current use of insulin (HCC)   [facility] [place], Odette Horns, MD   5 months ago Essential hypertension   [facility] Van, Jeannett Senior L, RPH-CPP   6 months ago Type 2 diabetes mellitus with hyperglycemia, with long-term current use of insulin [facility])   [place] [place], [place] W, NP   10 months ago Diabetes mellitus type 2, uncontrolled, with complications [facility])   [place] [facility] [place], Shea Stakes, NP   1 year ago Diabetes mellitus type 2, uncontrolled, with complications [facility])   [place] Claiborne Rigg, NP       Future Appointments             In 2 months Claiborne Rigg, NP [employer] [facility]

## 2021-07-12 ENCOUNTER — Other Ambulatory Visit: Payer: Self-pay

## 2021-07-14 ENCOUNTER — Other Ambulatory Visit: Payer: Self-pay

## 2021-07-22 ENCOUNTER — Ambulatory Visit (INDEPENDENT_AMBULATORY_CARE_PROVIDER_SITE_OTHER): Payer: Self-pay | Admitting: Orthopedic Surgery

## 2021-07-22 DIAGNOSIS — Z89512 Acquired absence of left leg below knee: Secondary | ICD-10-CM

## 2021-07-22 DIAGNOSIS — S42302A Unspecified fracture of shaft of humerus, left arm, initial encounter for closed fracture: Secondary | ICD-10-CM

## 2021-07-23 ENCOUNTER — Encounter: Payer: Self-pay | Admitting: Orthopedic Surgery
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
## 2021-08-30 ENCOUNTER — Other Ambulatory Visit: Payer: Self-pay

## 2021-09-08 ENCOUNTER — Telehealth: Payer: Self-pay | Admitting: Orthopedic Surgery

## 2021-09-08 ENCOUNTER — Other Ambulatory Visit: Payer: Self-pay

## 2021-09-08 ENCOUNTER — Encounter: Payer: Self-pay | Admitting: Family

## 2021-09-08 ENCOUNTER — Ambulatory Visit (INDEPENDENT_AMBULATORY_CARE_PROVIDER_SITE_OTHER): Payer: Self-pay | Admitting: Family

## 2021-09-08 DIAGNOSIS — Z89512 Acquired absence of left leg below knee: Secondary | ICD-10-CM

## 2021-09-08 NOTE — Telephone Encounter (Signed)
Pt wife called about the sleeve for pt leg.   CB [phone]

## 2021-09-08 NOTE — Telephone Encounter (Signed)
Patient has an odor and leg is leaking fluid. Would like to get in tomorrow morning. His girlfriend called for him. Her number is [phone]

## 2021-09-08 NOTE — Telephone Encounter (Signed)
Pt states that she already spoke with [facility] will call with any other questions.

## 2021-09-08 NOTE — Telephone Encounter (Signed)
Called sw pt's girl friend  and made an appt for today with erin
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
## 2021-09-14 ENCOUNTER — Telehealth: Payer: Self-pay | Admitting: Nurse Practitioner

## 2021-09-14 ENCOUNTER — Other Ambulatory Visit: Payer: Self-pay

## 2021-09-14 ENCOUNTER — Ambulatory Visit: Payer: Self-pay | Attending: Nurse Practitioner | Admitting: Nurse Practitioner

## 2021-09-14 NOTE — Telephone Encounter (Signed)
No answer. LVM ?

## 2021-09-16 ENCOUNTER — Other Ambulatory Visit: Payer: Self-pay

## 2021-09-16 ENCOUNTER — Other Ambulatory Visit: Payer: Self-pay | Admitting: Nurse Practitioner

## 2021-09-17 ENCOUNTER — Other Ambulatory Visit: Payer: Self-pay

## 2021-09-17 MED FILL — Insulin Aspart Soln Pen-injector 100 Unit/ML: SUBCUTANEOUS | 50 days supply | Qty: 15 | Fill #0 | Status: AC

## 2021-09-17 NOTE — Telephone Encounter (Signed)
Requested medications are on the active medication list two rx for this med on list  Last visit 06/14/21  Future visit scheduled 09/21/21  Notes to clinic two rx for this med, please assess.  Requested Prescriptions  Pending Prescriptions Disp Refills   NOVOLOG FLEXPEN 100 UNIT/ML FlexPen [Pharmacy Med Name: insulin aspart (NOVOLOG FLEXPEN) 100 UNIT/ML FlexPen] 15 mL 1    Sig: INJECT 10 UNITS INTO THE SKIN 3 (THREE) TIMES DAILY WITH MEALS.     Endocrinology:  Diabetes - Insulins Failed - 09/16/2021  2:59 PM      Failed - HBA1C is between 0 and 7.9 and within 180 days    HbA1c, POC (controlled diabetic range)  Date Value Ref Range Status  06/14/2021 8.1 (A) 0.0 - 7.0 % Final          Passed - Valid encounter within last 6 months    Recent Outpatient Visits           3 months ago Type 2 diabetes mellitus with hyperglycemia, with long-term current use of insulin (HCC)   [facility] [place], Odette Horns, MD   8 months ago Essential hypertension   [facility] [place], Jeannett Senior L, RPH-CPP   8 months ago Type 2 diabetes mellitus with hyperglycemia, with long-term current use of insulin [facility])   [facility] [place], [place] W, NP   1 year ago Diabetes mellitus type 2, uncontrolled, with complications [facility])   [facility] [place], Shea Stakes, NP   1 year ago Diabetes mellitus type 2, uncontrolled, with complications [facility])   [facility] Claiborne Rigg, NP       Future Appointments             In 3 days Nadara Mustard, MD [facility] [place]   In 4 days Claiborne Rigg, NP [facility]

## 2021-09-20 ENCOUNTER — Other Ambulatory Visit: Payer: Self-pay

## 2021-09-20 ENCOUNTER — Encounter: Payer: Self-pay | Admitting: Orthopedic Surgery

## 2021-09-20 ENCOUNTER — Ambulatory Visit (INDEPENDENT_AMBULATORY_CARE_PROVIDER_SITE_OTHER): Payer: Self-pay | Admitting: Orthopedic Surgery

## 2021-09-20 DIAGNOSIS — Z89512 Acquired absence of left leg below knee: Secondary | ICD-10-CM
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
## 2021-09-21 ENCOUNTER — Other Ambulatory Visit: Payer: Self-pay

## 2021-09-21 ENCOUNTER — Encounter: Payer: Self-pay | Admitting: Nurse Practitioner

## 2021-09-21 ENCOUNTER — Ambulatory Visit: Payer: Self-pay | Attending: Nurse Practitioner | Admitting: Nurse Practitioner

## 2021-09-21 DIAGNOSIS — I10 Essential (primary) hypertension: Secondary | ICD-10-CM

## 2021-09-21 DIAGNOSIS — D649 Anemia, unspecified: Secondary | ICD-10-CM

## 2021-09-21 DIAGNOSIS — Z794 Long term (current) use of insulin: Secondary | ICD-10-CM

## 2021-09-21 DIAGNOSIS — E1165 Type 2 diabetes mellitus with hyperglycemia: Secondary | ICD-10-CM

## 2021-09-21 MED ORDER — AMLODIPINE BESYLATE 5 MG PO TABS
ORAL_TABLET | Freq: Every day | ORAL | 2 refills | Status: DC
  Filled 2021-09-21: qty 30, 30d supply, fill #0

## 2021-09-21 MED ORDER — NOVOLOG FLEXPEN 100 UNIT/ML ~~LOC~~ SOPN
PEN_INJECTOR | SUBCUTANEOUS | 6 refills | Status: DC
  Filled 2021-09-21: qty 15, fill #0
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
## 2021-09-28 ENCOUNTER — Other Ambulatory Visit: Payer: Self-pay

## 2021-10-18 ENCOUNTER — Ambulatory Visit: Payer: Medicaid Other | Admitting: Orthopedic Surgery

## 2021-11-13 ENCOUNTER — Emergency Department (HOSPITAL_COMMUNITY)
Admission: EM | Admit: 2021-11-13 | Discharge: 2021-11-14 | Disposition: A | Discharge status: Home / Self Care | Payer: Medicaid Other | Attending: Physician Assistant | Admitting: Physician Assistant

## 2021-11-13 ENCOUNTER — Encounter (HOSPITAL_COMMUNITY): Payer: Self-pay

## 2021-11-13 ENCOUNTER — Other Ambulatory Visit: Payer: Self-pay

## 2021-11-13 DIAGNOSIS — R1110 Vomiting, unspecified: Secondary | ICD-10-CM | POA: Insufficient documentation

## 2021-11-13 DIAGNOSIS — Z5321 Procedure and treatment not carried out due to patient leaving prior to being seen by health care provider: Secondary | ICD-10-CM | POA: Insufficient documentation

## 2021-11-13 DIAGNOSIS — R1011 Right upper quadrant pain: Secondary | ICD-10-CM | POA: Insufficient documentation

## 2021-11-13 LAB — CBC WITH DIFFERENTIAL/PLATELET
Abs Immature Granulocytes: 0.01 10*3/uL (ref 0.00–0.07)
Basophils Absolute: 0 10*3/uL (ref 0.0–0.1)
Basophils Relative: 1 %
Eosinophils Absolute: 0.1 10*3/uL (ref 0.0–0.5)
Eosinophils Relative: 1 %
HCT: 39.4 % (ref 39.0–52.0)
Hemoglobin: 13.4 g/dL (ref 13.0–17.0)
Immature Granulocytes: 0 %
Lymphocytes Relative: 20 %
Lymphs Abs: 0.8 10*3/uL (ref 0.7–4.0)
MCH: 34.8 pg — ABNORMAL HIGH (ref 26.0–34.0)
MCHC: 34 g/dL (ref 30.0–36.0)
MCV: 102.3 fL — ABNORMAL HIGH (ref 80.0–100.0)
Monocytes Absolute: 0.4 10*3/uL (ref 0.1–1.0)
Monocytes Relative: 10 %
Neutro Abs: 2.9 10*3/uL (ref 1.7–7.7)
Neutrophils Relative %: 68 %
Platelets: 222 10*3/uL (ref 150–400)
RBC: 3.85 MIL/uL — ABNORMAL LOW (ref 4.22–5.81)
RDW: 13 % (ref 11.5–15.5)
WBC: 4.2 10*3/uL (ref 4.0–10.5)
nRBC: 0 % (ref 0.0–0.2)

## 2021-11-13 LAB — URINALYSIS, ROUTINE W REFLEX MICROSCOPIC
Bacteria, UA: NONE SEEN
Bilirubin Urine: NEGATIVE
Glucose, UA: >=500 mg/dL — AB
Hgb urine dipstick: NEGATIVE
Ketones, ur: 5 mg/dL — AB
Leukocytes,Ua: NEGATIVE
Nitrite: NEGATIVE
Protein, ur: NEGATIVE mg/dL
Specific Gravity, Urine: 1.012 (ref 1.005–1.030)
pH: 7 (ref 5.0–8.0)

## 2021-11-13 LAB — COMPREHENSIVE METABOLIC PANEL
ALT: 57 U/L — ABNORMAL HIGH (ref 0–44)
AST: 67 U/L — ABNORMAL HIGH (ref 15–41)
Albumin: 4.1 g/dL (ref 3.5–5.0)
Alkaline Phosphatase: 99 U/L (ref 38–126)
Anion gap: 12 (ref 5–15)
BUN: 10 mg/dL (ref 6–20)
CO2: 22 mmol/L (ref 22–32)
Calcium: 9.6 mg/dL (ref 8.9–10.3)
Chloride: 102 mmol/L (ref 98–111)
Creatinine, Ser: 0.73 mg/dL (ref 0.61–1.24)
GFR, Estimated: >60 mL/min (ref >60)
Glucose, Bld: 268 mg/dL — ABNORMAL HIGH (ref 70–99)
Potassium: 3.6 mmol/L (ref 3.5–5.1)
Sodium: 136 mmol/L (ref 135–145)
Total Bilirubin: 0.9 mg/dL (ref 0.3–1.2)
Total Protein: 8.4 g/dL — ABNORMAL HIGH (ref 6.5–8.1)

## 2021-11-13 LAB — LIPASE, BLOOD: Lipase: 22 U/L (ref 11–51)

## 2021-11-13 MED ORDER — ONDANSETRON 4 MG PO TBDP
4.0000 mg | ORAL_TABLET | Freq: Once | ORAL | Status: AC
  Administered 2021-11-13: 4 mg via ORAL
  Filled 2021-11-13: qty 1

## 2021-11-13 NOTE — ED Notes (Signed)
Pt c/o pain and nausea

## 2021-11-13 NOTE — ED Provider Notes (Signed)
Emergency Medicine Provider Triage Evaluation Note  [name] , a 45 y.o. male  was evaluated in triage.  Pt complains of abd pain, vomiting.  Review of Systems  Positive: Abd pain, vomiting Negative: fever  Physical Exam  Ht 6\' 1"  (1.854 m)    Wt 102.1 kg    BMI 29.69 kg/m  Gen:   Awake, no distress   Resp:  Normal effort  MSK:   Moves extremities without difficulty  Other:  Ruq ttp  Medical Decision Making  Medically screening exam initiated at 1:20 PM.  Appropriate orders placed.  [name] was informed that the remainder of the evaluation will be completed by another provider, this initial triage assessment does not replace that evaluation, and the importance of remaining in the ED until their evaluation is complete.     Nedra Hai 11/13/21 1320    11/15/21, MD 11/13/21 1459

## 2021-11-13 NOTE — ED Triage Notes (Signed)
Per EMS pt has right upper abdomen pain. Vomiting since this morning. Drink OH- this morning and last week. Hx: Pancreatitis   180/100 BP 64 HR 15 RR 100 RA 103 CBG

## 2021-11-15 ENCOUNTER — Other Ambulatory Visit: Payer: Self-pay

## 2021-12-03 ENCOUNTER — Other Ambulatory Visit (HOSPITAL_COMMUNITY): Payer: Self-pay

## 2021-12-06 ENCOUNTER — Telehealth: Payer: Self-pay | Admitting: Pharmacist

## 2021-12-06 ENCOUNTER — Other Ambulatory Visit: Payer: Self-pay | Admitting: Nurse Practitioner

## 2021-12-06 ENCOUNTER — Other Ambulatory Visit (HOSPITAL_COMMUNITY): Payer: Self-pay

## 2021-12-06 ENCOUNTER — Other Ambulatory Visit: Payer: Self-pay

## 2021-12-06 ENCOUNTER — Other Ambulatory Visit: Payer: Self-pay | Admitting: Pharmacist

## 2021-12-06 ENCOUNTER — Ambulatory Visit: Payer: Self-pay

## 2021-12-06 DIAGNOSIS — Z794 Long term (current) use of insulin: Secondary | ICD-10-CM

## 2021-12-06 DIAGNOSIS — E1165 Type 2 diabetes mellitus with hyperglycemia: Secondary | ICD-10-CM

## 2021-12-06 MED ORDER — VICTOZA 18 MG/3ML ~~LOC~~ SOPN
PEN_INJECTOR | Freq: Every day | SUBCUTANEOUS | 3 refills | Status: DC
  Filled 2021-12-06: qty 9, 30d supply, fill #0
  Filled 2022-03-14: qty 9, 30d supply, fill #1

## 2021-12-06 MED ORDER — BASAGLAR KWIKPEN 100 UNIT/ML ~~LOC~~ SOPN
30.0000 [IU] | PEN_INJECTOR | Freq: Every day | SUBCUTANEOUS | 1 refills | Status: DC
  Filled 2021-12-06: qty 9, 30d supply, fill #0
  Filled 2022-03-14: qty 9, 30d supply, fill #1

## 2021-12-06 MED ORDER — NOVOLOG FLEXPEN 100 UNIT/ML ~~LOC~~ SOPN
PEN_INJECTOR | SUBCUTANEOUS | 6 refills | Status: DC
  Filled 2021-12-06: qty 9, 30d supply, fill #0
  Filled 2022-03-14: qty 15, 50d supply, fill #1

## 2021-12-06 NOTE — Telephone Encounter (Signed)
Patient states he feels fine but blood sugar has been running at 300 today. Has not taken his insulin because he is out    Chief Complaint: Out of medications, asking for appointment tomorrow. Symptoms: None Frequency: Out of Lantus, Victoza x 3 days Pertinent Negatives: Patient denies symptoms Disposition: [] ED /[] Urgent Care (no appt availability in office) / [] Appointment(In office/virtual)/ []  [facility]/ [] Home Care/ [] Refused Recommended Disposition /[] [place] [facility]/ []  Follow-up with PCP Additional Notes: Please advise pt.  Answer Assessment - Initial Assessment Questions 1. DRUG NAME: "What medicine do you need to have refilled?"     Victoza, Lantus 2. REFILLS REMAINING: "How many refills are remaining?" (Note: The label on the medicine or pill bottle will show how many refills are remaining. If there are no refills remaining, then a renewal may be needed.)     0 3. EXPIRATION DATE: "What is the expiration date?" (Note: The label states when the prescription will expire, and thus can no longer be refilled.)     N/a 4. PRESCRIBING HCP: "Who prescribed it?" Reason: If prescribed by specialist, call should be referred to that group.     [place] 5. SYMPTOMS: "Do you have any symptoms?"     None, but blood sugar 300. 6. PREGNANCY: "Is there any chance that you are pregnant?" "When was your last menstrual period?"     N/a  Protocols used: Medication Refill and Renewal Call-A-AH

## 2021-12-06 NOTE — Telephone Encounter (Signed)
Called pt unable to reach, left VM to call back. 

## 2021-12-06 NOTE — Telephone Encounter (Signed)
Medication Refill - Medication: liraglutide (VICTOZA) 18 MG/3ML SOPN,  insulin aspart (NOVOLOG FLEXPEN) 100 UNIT/ML FlexPen  (Agent: If no, request that the patient contact the pharmacy for the refill. If patient does not wish to contact the pharmacy document the reason why and proceed with request.) Patient unable to reach pharmacy    Preferred Pharmacy (with phone number or street name):  [facility] Phone:  [phone]  Fax:  [phone]       Has the patient been seen for an appointment in the last year OR does the patient have an upcoming appointment? Yes.    Agent: Please be advised that RX refills may take up to 3 business days. We ask that you follow-up with your pharmacy.

## 2021-12-06 NOTE — Telephone Encounter (Signed)
Rxs sent for both insulins and Victoza. Pt will need an OV in the future for additional refills.

## 2021-12-06 NOTE — Telephone Encounter (Signed)
Requested Prescriptions  Pending Prescriptions Disp Refills   liraglutide (VICTOZA) 18 MG/3ML SOPN 9 mL 3    Sig: INJECT 0.3 MLS (1.8 MG TOTAL) INTO THE SKIN DAILY.     Endocrinology:  Diabetes - GLP-1 Receptor Agonists Failed - 12/06/2021 11:49 AM      Failed - HBA1C is between 0 and 7.9 and within 180 days    HbA1c, POC (controlled diabetic range)  Date Value Ref Range Status  06/14/2021 8.1 (A) 0.0 - 7.0 % Final         Passed - Valid encounter within last 6 months    Recent Outpatient Visits          2 months ago Type 2 diabetes mellitus with hyperglycemia, with long-term current use of insulin [facility])   [place] [facility] [place], [place] W, NP   5 months ago Type 2 diabetes mellitus with hyperglycemia, with long-term current use of insulin (HCC)   [facility] [place], Odette Horns, MD   10 months ago Essential hypertension   [facility] [place], Jeannett Senior L, RPH-CPP   11 months ago Type 2 diabetes mellitus with hyperglycemia, with long-term current use of insulin [facility])   [place] [facility] [place], [place] W, NP   1 year ago Diabetes mellitus type 2, uncontrolled, with complications [facility])   [place] [facility] [place], [place] W, NP              insulin aspart (NOVOLOG FLEXPEN) 100 UNIT/ML FlexPen 15 mL 6    Sig: INJECT 10 UNITS INTO THE SKIN 3 (THREE) TIMES DAILY WITH MEALS.     Endocrinology:  Diabetes - Insulins Failed - 12/06/2021 11:49 AM      Failed - HBA1C is between 0 and 7.9 and within 180 days    HbA1c, POC (controlled diabetic range)  Date Value Ref Range Status  06/14/2021 8.1 (A) 0.0 - 7.0 % Final         Passed - Valid encounter within last 6 months    Recent Outpatient Visits          2 months ago Type 2 diabetes mellitus with hyperglycemia, with long-term current use of insulin [facility])   [facility] [place],  [place] W, NP   5 months ago Type 2 diabetes mellitus with hyperglycemia, with long-term current use of insulin (HCC)   [facility] [place] Register, MD   10 months ago Essential hypertension   [facility] [place], Jeannett Senior L, RPH-CPP   11 months ago Type 2 diabetes mellitus with hyperglycemia, with long-term current use of insulin [facility])   [place] [facility] [place], Shea Stakes, NP   1 year ago Diabetes mellitus type 2, uncontrolled, with complications [facility])   [facility] [place], Shea Stakes, NP

## 2021-12-07 ENCOUNTER — Other Ambulatory Visit: Payer: Self-pay

## 2021-12-10 ENCOUNTER — Other Ambulatory Visit: Payer: Self-pay

## 2021-12-20 ENCOUNTER — Other Ambulatory Visit: Payer: Self-pay

## 2021-12-21 ENCOUNTER — Other Ambulatory Visit: Payer: Self-pay

## 2021-12-31 ENCOUNTER — Other Ambulatory Visit: Payer: Self-pay | Admitting: Nurse Practitioner

## 2021-12-31 DIAGNOSIS — E1165 Type 2 diabetes mellitus with hyperglycemia: Secondary | ICD-10-CM

## 2021-12-31 DIAGNOSIS — I10 Essential (primary) hypertension: Secondary | ICD-10-CM

## 2021-12-31 DIAGNOSIS — Z794 Long term (current) use of insulin: Secondary | ICD-10-CM

## 2021-12-31 DIAGNOSIS — D649 Anemia, unspecified: Secondary | ICD-10-CM

## 2022-02-14 ENCOUNTER — Ambulatory Visit (INDEPENDENT_AMBULATORY_CARE_PROVIDER_SITE_OTHER): Payer: Self-pay | Admitting: Orthopedic Surgery

## 2022-02-14 DIAGNOSIS — Z89512 Acquired absence of left leg below knee: Secondary | ICD-10-CM

## 2022-02-20 ENCOUNTER — Encounter: Payer: Self-pay | Admitting: Orthopedic Surgery
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
## 2022-03-06 ENCOUNTER — Encounter (HOSPITAL_COMMUNITY): Payer: Self-pay

## 2022-03-06 ENCOUNTER — Ambulatory Visit (HOSPITAL_COMMUNITY)
Admission: EM | Admit: 2022-03-06 | Discharge: 2022-03-06 | Disposition: A | Discharge status: Home / Self Care | Payer: 59 | Attending: Family Medicine | Admitting: Family Medicine

## 2022-03-06 DIAGNOSIS — T8789 Other complications of amputation stump: Secondary | ICD-10-CM | POA: Diagnosis not present

## 2022-03-06 MED ORDER — AMOXICILLIN-POT CLAVULANATE 875-125 MG PO TABS: 1.0000 | ORAL_TABLET | Freq: Two times a day (BID) | ORAL | 0 refills | Status: AC

## 2022-03-06 MED ORDER — AMOXICILLIN-POT CLAVULANATE 875-125 MG PO TABS
1.0000 | ORAL_TABLET | Freq: Two times a day (BID) | ORAL | 0 refills | Status: DC
  Filled 2022-03-06: qty 14, 7d supply, fill #0

## 2022-03-06 NOTE — ED Triage Notes (Signed)
Pt presents with c/o a blister on the L leg. States he is concerned of infection. Reports swelling. States he applied neosporin to the area.  ?

## 2022-03-06 NOTE — Discharge Instructions (Addendum)
Change your bandage daily: wash with warm soapy water and apply new antibiotic ointment, and then wrap up. ? ?Call your orthopedist and your primary office. ? ?Also call wound care clinic. ? ?Take amoxicillin-clavulanate 875 mg--1 tab twice daily with food for 7 days  ?
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
## 2022-03-07 ENCOUNTER — Other Ambulatory Visit: Payer: Self-pay

## 2022-03-09 ENCOUNTER — Ambulatory Visit (INDEPENDENT_AMBULATORY_CARE_PROVIDER_SITE_OTHER): Payer: 59 | Admitting: Family

## 2022-03-09 ENCOUNTER — Encounter: Payer: Self-pay | Admitting: Family

## 2022-03-09 DIAGNOSIS — Z89512 Acquired absence of left leg below knee: Secondary | ICD-10-CM | POA: Diagnosis not present

## 2022-03-09 DIAGNOSIS — S88119S Complete traumatic amputation at level between knee and ankle, unspecified lower leg, sequela: Secondary | ICD-10-CM
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
## 2022-03-11 ENCOUNTER — Telehealth: Payer: Self-pay | Admitting: Family

## 2022-03-11 NOTE — Telephone Encounter (Signed)
You saw pt in the office this week needs rx for prosthetic sent to [facility] please.  ?

## 2022-03-11 NOTE — Telephone Encounter (Signed)
Pt called requesting a prescription be sent to [facility] for left leg sleeves for prostatic leg. Pt phone number is [phone]. ?

## 2022-03-14 ENCOUNTER — Other Ambulatory Visit: Payer: Self-pay

## 2022-03-18 ENCOUNTER — Encounter (HOSPITAL_BASED_OUTPATIENT_CLINIC_OR_DEPARTMENT_OTHER): Payer: 59 | Attending: General Surgery | Admitting: General Surgery

## 2022-03-18 DIAGNOSIS — D509 Iron deficiency anemia, unspecified: Secondary | ICD-10-CM | POA: Insufficient documentation

## 2022-03-18 DIAGNOSIS — E11621 Type 2 diabetes mellitus with foot ulcer: Secondary | ICD-10-CM | POA: Insufficient documentation

## 2022-03-18 DIAGNOSIS — Z89512 Acquired absence of left leg below knee: Secondary | ICD-10-CM | POA: Diagnosis not present

## 2022-03-18 DIAGNOSIS — L97829 Non-pressure chronic ulcer of other part of left lower leg with unspecified severity: Secondary | ICD-10-CM | POA: Insufficient documentation

## 2022-03-18 DIAGNOSIS — E538 Deficiency of other specified B group vitamins: Secondary | ICD-10-CM | POA: Insufficient documentation

## 2022-03-18 DIAGNOSIS — Z794 Long term (current) use of insulin: Secondary | ICD-10-CM | POA: Diagnosis not present

## 2022-03-18 NOTE — Progress Notes (Signed)
[name], Dylan Dylan. ([id number]) ?Visit Report for 03/18/2022 ?Abuse Risk Screen Details ?Patient Name: Date of Service: ?[name], [name]. 03/18/2022 9:00 Dylan M ?Medical Record Number: [id number] ?Patient Account Number: [id number] ?Date of Birth/Sex: Treating RN: ?08/10/1976 (46 y.o. [name]) [name] ?Primary Care Chrystel Wall: [name] Other Clinician: ?Referring [name]: ?Treating [name]/Extender: [name] ?[name] ?Weeks in Treatment: 0 ?Abuse Risk Screen Items ?Answer ?ABUSE RISK SCREEN: ?Has anyone close to you tried to hurt or harm you recentlyo No ?Do you feel uncomfortable with anyone in your familyo No ?Has anyone forced you do things that you didnt want to doo No ?Electronic Signature(s) ?Signed: 03/18/2022 5:10:04 PM By: Dylan Catholic RN ?Entered By: [name] on 03/18/2022 09:11:46 ?-------------------------------------------------------------------------------- ?Activities of Daily Living Details ?Patient Name: Date of Service: ?[name], [name]. 03/18/2022 9:00 Dylan M ?Medical Record Number: [id number] ?Patient Account Number: [id number] ?Date of Birth/Sex: Treating RN: ?03-09-1976 (46 y.o. [name]) [name] ?Primary Care [name]: [name] Other Clinician: ?Referring Comfort Iversen: ?Treating [name]/Extender: [name] ?[name] ?Weeks in Treatment: 0 ?Activities of Daily Living Items ?Answer ?Activities of Daily Living (Please select one for each item) ?Drive Automobile Not Able ?T Medications ?ake Completely Able ?Use T elephone Completely Able ?Care for Appearance Completely Able ?Use T oilet Completely Able ?Bath / Shower Completely Able ?Dress Self Completely Able ?Feed Self Completely Able ?Walk Completely Able ?Get In / Out Bed Completely Able ?Housework Completely Able ?Prepare Meals Completely Able ?Handle Money Completely Able ?Shop for Self Completely Able ?Electronic Signature(s) ?Signed: 03/18/2022 5:10:04 PM By: Dylan Catholic RN ?Entered By: [name]  on 03/18/2022 09:12:16 ?-------------------------------------------------------------------------------- ?Education Screening Details ?Patient Name: ?Date of Service: ?[name], [name]. 03/18/2022 9:00 Dylan M ?Medical Record Number: [id number] ?Patient Account Number: [id number] ?Date of Birth/Sex: ?Treating RN: ?March 20, 1976 (46 y.o. [name]) [name] ?Primary Care [name]: [name] ?Other Clinician: ?Referring [name]: ?Treating [name]/Extender: [name] ?[name] ?Weeks in Treatment: 0 ?Primary Learner Assessed: Patient ?Learning Preferences/Education Level/Primary Language ?Learning Preference: Explanation, Demonstration, Printed Material ?Highest Education Level: College or Above ?Preferred Language: English ?Cognitive Barrier ?Language Barrier: No ?Translator Needed: No ?Memory Deficit: No ?Emotional Barrier: No ?Cultural/Religious Beliefs Affecting Medical Care: No ?Physical Barrier ?Impaired Vision: No ?Impaired Hearing: No ?Decreased Hand dexterity: No ?Knowledge/Comprehension ?Knowledge Level: High ?Comprehension Level: High ?Ability to understand written instructions: High ?Ability to understand verbal instructions: High ?Motivation ?Anxiety Level: Calm ?Cooperation: Cooperative ?Education Importance: Acknowledges Need ?Interest in Health Problems: Asks Questions ?Perception: Coherent ?Willingness to Engage in Self-Management High ?Activities: ?Readiness to Engage in Self-Management High ?Activities: ?Electronic Signature(s) ?Signed: 03/18/2022 5:10:04 PM By: Dylan Catholic RN ?Entered By: [name] on 03/18/2022 09:12:58 ?-------------------------------------------------------------------------------- ?Fall Risk Assessment Details ?Patient Name: ?Date of Service: ?[name], [name]. 03/18/2022 9:00 Dylan M ?Medical Record Number: [id number] ?Patient Account Number: [id number] ?Date of Birth/Sex: ?Treating RN: ?[birth year] (46 y.o. [name]) [name] ?Primary Care [name]: [name] ?Other Clinician: ?Referring Tahmid Stonehocker: ?Treating [name]/Extender: [name] ?[name] ?Weeks in Treatment: 0 ?Fall Risk Assessment Items ?Have you had 2 or more falls in the last 12 monthso 0 Yes ?Have you had any fall that resulted in injury in the last 12 monthso 0 No ?FALLS RISK SCREEN ?History of falling - immediate or within 3 months 25 Yes ?Secondary diagnosis (Do you have 2 or more medical diagnoseso) 0 No ?Ambulatory aid ?None/bed rest/wheelchair/nurse 0 Yes ?Crutches/cane/walker 0 No ?Furniture 0 No ?Intravenous therapy Access/Saline/Heparin Lock 0 No ?Gait/Transferring ?Normal/ bed rest/ wheelchair  0 Yes ?Weak (short steps with or without shuffle, stooped but able to lift head while walking, may seek 0 No ?support from furniture) ?Impaired (short steps with shuffle, may have difficulty arising from chair, head down, impaired 0 No ?balance) ?Mental Status ?Oriented to own ability 0 Yes ?Electronic Signature(s) ?Signed: 03/18/2022 5:10:04 PM By: Dylan Catholic RN ?Entered By: [name] on 03/18/2022 09:13:56 ?-------------------------------------------------------------------------------- ?Foot Assessment Details ?Patient Name: ?Date of Service: ?[name], [name]. 03/18/2022 9:00 Dylan M ?Medical Record Number: [id number] ?Patient Account Number: [id number] ?Date of Birth/Sex: ?Treating RN: ?[birth year] (46 y.o. [name]) [name] ?Primary Care [name]: [name] ?Other Clinician: ?Referring [name]: ?Treating [name]/Extender: [name] ?[name] ?Weeks in Treatment: 0 ?Foot Assessment Items ?[x]  Unable to perform left foot assessment due to amputation ?Site Locations ?+ = Sensation present, - = Sensation absent, C = Callus, U = Ulcer ?R = Redness, W = Warmth, M = Maceration, PU = Pre-ulcerative lesion ?F = Fissure, S = Swelling, D = Dryness ?Assessment ?Right: Left: ?Other Deformity: No ?Prior Foot Ulcer: No ?Prior Amputation: No ?Charcot Joint: No ?Ambulatory Status:  Ambulatory Without Help ?Gait: Steady ?Electronic Signature(s) ?Signed: 03/18/2022 5:10:04 PM By: Dylan Catholic RN ?Entered By: [name] on 03/18/2022 09:15:04 ?-------------------------------------------------------------------------------- ?Nutrition Risk Screening Details ?Patient Name: ?Date of Service: ?[name], [name]. 03/18/2022 9:00 Dylan M ?Medical Record Number: [id number] ?Patient Account Number: [id number] ?Date of Birth/Sex: ?Treating RN: ?[birth year] (46 y.o. [name]) [name] ?Primary Care Aissata Wilmore: [name] ?Other Clinician: ?Referring Marche Hottenstein: ?Treating Suhaib Guzzo/Extender: [name] ?[name] ?Weeks in Treatment: 0 ?Height (in): ?Weight (lbs): ?Body Mass Index (BMI): ?Nutrition Risk Screening Items ?Score Screening ?NUTRITION RISK SCREEN: ?I have an illness or condition that made me change the kind and/or amount of food I eat 0 No ?I eat fewer than two meals per day 0 No ?I eat few fruits and vegetables, or milk products 0 No ?I have three or more drinks of beer, liquor or wine almost every day 0 No ?I have tooth or mouth problems that make it hard for me to eat 0 No ?I don't always have enough money to buy the food I need 0 No ?I eat alone most of the time 1 Yes ?I take three or more different prescribed or over-the-counter drugs Dylan day 1 Yes ?Without wanting to, I have lost or gained 10 pounds in the last six months 0 No ?I am not always physically able to shop, cook and/or feed myself 0 No ?Nutrition Protocols ?Good Risk Protocol 0 No interventions needed ?Moderate Risk Protocol ?High Risk Proctocol ?Risk Level: Good Risk ?Score: 2 ?Electronic Signature(s) ?Signed: 03/18/2022 5:10:04 PM By: Dylan Catholic RN ?Entered By: [name] on 03/18/2022 09:14:48 ?

## 2022-03-18 NOTE — Progress Notes (Signed)
[name], Dylan A. ([id number]) ?Visit Report for 03/18/2022 ?Chief Complaint Document Details ?Patient Name: Date of Service: ?[name], [name] 03/18/2022 9:00 A M ?Medical Record Number: [id number] ?Patient Account Number: [id number] ?Date of Birth/Sex: Treating RN: ?24-Feb-1976 (46 y.o. M) ?Primary Care Provider: Bertram Denver Other Clinician: ?Referring Provider: ?Treating Provider/Extender: Duanne Guess ?Bertram Denver ?Weeks in Treatment: 0 ?Information Obtained from: Patient ?Chief Complaint ?08/14/18; patient is here for review of wound on the left lateral foot ?03/18/2022: patient here for ulcer on lateral aspect of BKA site (left) ?Electronic Signature(s) ?Signed: 03/18/2022 10:14:18 AM By: Duanne Guess MD FACS ?Entered By: Duanne Guess on 03/18/2022 10:14:18 ?-------------------------------------------------------------------------------- ?HPI Details ?Patient Name: Date of Service: ?[name], [name] 03/18/2022 9:00 A M ?Medical Record Number: [id number] ?Patient Account Number: [id number] ?Date of Birth/Sex: Treating RN: ?07/28/1976 (46 y.o. M) ?Primary Care Provider: Bertram Denver Other Clinician: ?Referring Provider: ?Treating Provider/Extender: Duanne Guess ?Bertram Denver ?Weeks in Treatment: 0 ?History of Present Illness ?HPI Description: ADMISSION ?08/14/18 ?This is a 46 year old man with presumably type 2 diabetes on insulin. He was in hospital from 8/8 through 07/09/18 with a diabetic foot ulcer on the left but this ?was apparently at the base between the left first and second toes. An MRI of the area showed cellulitis but no osteomyelitis. He was seen by orthopedics who ?aspirated 2 cc of pus [Dr. Luiz Blare. CandS showed staph Lugdunensis. He was discharged on doxycycline and cephalexin. He is completed these. He was seen ?in follow-up by a orthopedics Dr. Jerl Santos on 8/15 in follow-up. At that point he had already developed another area laterally over the fifth metatarsal base. At ?that point  there was some malodor. He is watching washing the wound with saline and applying gauze. He works in a [employer] and has the wear work boots. It ?sounds as though he is on his feet for long hours. ?Patient is a type II diabetic although he was diagnosed at age 68 and started immediately on insulin. I am uncertain whether he had a C-peptide or anti- ?pancreatic antibodies. He also has B12 deficiency and a history of anemia ?08/23/2018; this is a patient who we admitted to clinic last week. He had a culture that showed group B strep. Unfortunately we did not have a phone number for ?him and his pharmacy was not in our computer system. I had prescribed Amoxil but he has not started this as of yet. He does not complain of pain. I think he ?probably has diabetic neuropathy but he does have some sensation. ?08/30/2018; area on the left lateral foot at the base the fifth metatarsal. This is come in nicely this week. We have been using silver alginate. He does not have ?insurance and the options for dressing supplies will be limited. Nevertheless his wound looks better there is certainly less depth to this. He continues to work on ?the foot which makes options also for healing and offloading difficult ?09/06/2018; the left lateral foot at the base of fifth metatarsal. Not nearly as good as a week ago. We have been using silver alginate. He is completing his ?antibiotics. Wound is deep probing towards the medial part of his foot. There is no palpable bone ?09/14/2018; left lateral foot at the base of the fifth metatarsal. Culture I did last week showed a few Staphylococcus aureus and a few Citrobacter Koseri. I will ?start him on ciprofloxacin today which is a cost effective antibiotic for this patient who I do not believe has insurance. He has  insoles and is trying to afford ?new boots. He walks on the outside of his foot and I have talked to him about this. He will need a follow-up x-ray which I have arranged  today. ?09/25/2018; I prescribed Cipro last week for methicillin sensitive staph aureus and Citrobacter. For some reason they did not pick up the ciprofloxacin but ?seemed to have doxycycline I am not really sure what the issue was here. The wound continues to require debridement. I am going to get an x-ray today. ?10/02/2018. The patient's x-ray did not show bony erosion to suggest osteomyelitis. An MRI was suggested and I agree with this. He started the ciprofloxacin 2 ?days ago which should cover the methicillin sensitive staph and Citrobacter. Unfortunately this was known I think 2 weeks ago. We are using silver alginate to ?the wound. ?READMISSION ?03/18/2022 ?This patient was last seen in our clinic in 2019. 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 Lajoyce Corners. He does wear a prosthetic, but works long hours on his feet and goes through socks very quickly. His prosthesis has rubbed and he ?initially had a blister at the site we are evaluating today. It got larger and ultimately transformed into a wound. He was seen in the emergency department on ?March 06, 2022. No imaging was performed at that time but he was prescribed antibiotics. He followed up in the orthopedic clinic a couple of days later. They ?have placed a prescription for extra liners as well as a new prosthesis. They also ordered him out of work. He has been applying topical antibiotic ointment. ?Since that visit, the wound has been healing, likely secondary to him staying off of the residual limb. He is here today for ongoing evaluation and management ?of the ulcer. ?Compared to the picture taken in the emergency department on April 9, the wound is much smaller and quite superficial at this point. There is no periwound ?erythema, induration, or drainage. It appears to be epithelializing. ?Electronic Signature(s) ?Signed: 03/18/2022 10:19:28 AM By: Duanne Guess  MD FACS ?Entered By: Duanne Guess on 03/18/2022 10:19:28 ?-------------------------------------------------------------------------------- ?Physical Exam Details ?Patient Name: Date of Service: ?Devilla, [name] 03/18/2022 9:00 A M ?Medical Record Number: [id number] ?Patient Account Number: [id number] ?Date of Birth/Sex: Treating RN: ?21-Jun-1976 (46 y.o. M) ?Primary Care Provider: Bertram Denver Other Clinician: ?Referring Provider: ?Treating Provider/Extender: Duanne Guess ?Bertram Denver ?Weeks in Treatment: 0 ?Constitutional ?He is hypertensive.. . . . No acute distress. ?Respiratory ?Normal work of breathing on room air.Marland Kitchen ?Notes ?03/18/2022: On the lateral aspect of his left BKA stump, there is a roughly 3 cm very superficial wound with no accumulated slough or visible drainage. No ?concern for infection. ?Electronic Signature(s) ?Signed: 03/18/2022 10:20:38 AM By: Duanne Guess MD FACS ?Entered By: Duanne Guess on 03/18/2022 10:20:37 ?-------------------------------------------------------------------------------- ?Physician Orders Details ?Patient Name: Date of Service: ?Karbowski, [name] 03/18/2022 9:00 A M ?Medical Record Number: [id number] ?Patient Account Number: [id number] ?Date of Birth/Sex: Treating RN: ?01-25-1976 (46 y.o. [name] ?Primary Care Provider: Bertram Denver Other Clinician: ?Referring Provider: ?Treating Provider/Extender: Duanne Guess ?Bertram Denver ?Weeks in Treatment: 0 ?Verbal / Phone Orders: No ?Diagnosis Coding ?ICD-10 Coding ?Code Description ?E11.622 Type 2 diabetes mellitus with other skin ulcer ?X32.440 Acquired absence of left leg below knee ?L97.829 Non-pressure chronic ulcer of other part of left lower leg with unspecified severity ?Follow-up Appointments ?ppointment in 1 week. - Dr. Lady Gary Rm 1 ?Return A ?  Bathing/ Shower/ Hygiene ?May shower and wash wound with soap and water. ?Off-Loading ?Other: - minimal weight bearing left stump ?Wound  Treatment ?Wound #2 - Amputation Site - Below Knee Wound Laterality: Left ?Prim Dressing: KerraCel Ag Gelling Fiber Dressing, 2x2 in (silver alginate) 1 x Per Day/30 Days ?ary ?Discharge Instructions: Apply silver alginate to wound

## 2022-03-18 NOTE — Progress Notes (Signed)
[name], Dylan Dylan. ([id number]) ?Visit Report for 03/18/2022 ?Allergy List Details ?Patient Name: Date of Service: ?[name], Dylan NTO NIO Dylan. 03/18/2022 9:00 Dylan M ?Medical Record Number: [id number] ?Patient Account Number: [id number] ?Date of Birth/Sex: Treating RN: ?30-Jan-1976 (46 y.o. Judie Petit) Karie Schwalbe ?Primary Care Dontay Harm: Bertram Denver Other Clinician: ?Referring Conya Ellinwood: ?Treating Rubena Roseman/Extender: Duanne Guess ?Bertram Denver ?Weeks in Treatment: 0 ?Allergies ?Active Allergies ?No Known Drug Allergies ?Allergy Notes ?Electronic Signature(s) ?Signed: 03/18/2022 5:10:04 PM By: Karie Schwalbe RN ?Entered By: Karie Schwalbe on 03/18/2022 09:04:49 ?-------------------------------------------------------------------------------- ?Arrival Information Details ?Patient Name: Date of Service: ?[name], Dylan NTO NIO Dylan. 03/18/2022 9:00 Dylan M ?Medical Record Number: [id number] ?Patient Account Number: [id number] ?Date of Birth/Sex: Treating RN: ?July 20, 1976 (46 y.o. Judie Petit) Karie Schwalbe ?Primary Care Amato Sevillano: Bertram Denver Other Clinician: ?Referring Thedford Bunton: ?Treating Sahaj Bona/Extender: Duanne Guess ?Bertram Denver ?Weeks in Treatment: 0 ?Visit Information ?Patient Arrived: Ambulatory ?Arrival Time: 08:58 ?Accompanied By: self ?Transfer Assistance: None ?Patient Identification Verified: Yes ?Secondary Verification Process Completed: Yes ?Patient Requires Transmission-Based Precautions: No ?Patient Has Alerts: No ?History Since Last Visit ?Hospitalized since last visit: Yes ?Has Dressing in Place as Prescribed: Yes ?Electronic Signature(s) ?Signed: 03/18/2022 5:10:04 PM By: Karie Schwalbe RN ?Entered By: Karie Schwalbe on 03/18/2022 09:02:02 ?-------------------------------------------------------------------------------- ?Clinic Level of Care Assessment Details ?Patient Name: Date of Service: ?[name], Dylan NTO NIO Dylan. 03/18/2022 9:00 Dylan M ?Medical Record Number: [id number] ?Patient Account Number: [id number] ?Date of Birth/Sex: Treating  RN: ?11-27-1976 (46 y.o. [name] ?Primary Care Deitrick Ferreri: Bertram Denver Other Clinician: ?Referring Rayfield Beem: ?Treating Daphene Chisholm/Extender: Duanne Guess ?Bertram Denver ?Weeks in Treatment: 0 ?Clinic Level of Care Assessment Items ?TOOL 2 Quantity Score ?[]  - 0 ?Use when only an EandM is performed on the INITIAL visit ?ASSESSMENTS - Nursing Assessment / Reassessment ?X- 1 20 ?General Physical Exam (combine w/ comprehensive assessment (listed just below) when performed on new pt. evals) ?X- 1 25 ?Comprehensive Assessment (HX, ROS, Risk Assessments, Wounds Hx, etc.) ?ASSESSMENTS - Wound and Skin Dylan ssessment / Reassessment ?X - Simple Wound Assessment / Reassessment - one wound 1 5 ?[]  - 0 ?Complex Wound Assessment / Reassessment - multiple wounds ?[]  - 0 ?Dermatologic / Skin Assessment (not related to wound area) ?ASSESSMENTS - Ostomy and/or Continence Assessment and Care ?[]  - 0 ?Incontinence Assessment and Management ?[]  - 0 ?Ostomy Care Assessment and Management (repouching, etc.) ?PROCESS - Coordination of Care ?X - Simple Patient / Family Education for ongoing care 1 15 ?[]  - 0 ?Complex (extensive) Patient / Family Education for ongoing care ?X- 1 10 ?Staff obtains Consents, Records, T Results / Process Orders ?est ?[]  - 0 ?Staff telephones HHA, Nursing Homes / Clarify orders / etc ?[]  - 0 ?Routine Transfer to another Facility (non-emergent condition) ?[]  - 0 ?Routine Hospital Admission (non-emergent condition) ?X- 1 15 ?New Admissions / / Ordering NPWT Apligraf, etc. ?, ?[]  - 0 ?Emergency Hospital Admission (emergent condition) ?X- 1 10 ?Simple Discharge Coordination ?[]  - 0 ?Complex (extensive) Discharge Coordination ?PROCESS - Special Needs ?[]  - 0 ?Pediatric / Minor Patient Management ?[]  - 0 ?Isolation Patient Management ?[]  - 0 ?Hearing / Language / Visual special needs ?[]  - 0 ?Assessment of Community assistance (transportation, D/C planning, etc.) ?[]  - 0 ?Additional  assistance / Altered mentation ?[]  - 0 ?Support Surface(s) Assessment (bed, cushion, seat, etc.) ?INTERVENTIONS - Wound Cleansing / Measurement ?X- 1 5 ?Wound Imaging (photographs - any number of wounds) ?[]  - 0 ?Wound Tracing (instead of photographs) ?X- 1 5 ?Simple Wound Measurement - one wound ?[]  -  0 ?Complex Wound Measurement - multiple wounds ?X- 1 5 ?Simple Wound Cleansing - one wound ?[]  - 0 ?Complex Wound Cleansing - multiple wounds ?INTERVENTIONS - Wound Dressings ?X - Small Wound Dressing one or multiple wounds 1 10 ?[]  - 0 ?Medium Wound Dressing one or multiple wounds ?[]  - 0 ?Large Wound Dressing one or multiple wounds ?[]  - 0 ?Application of Medications - injection ?INTERVENTIONS - Miscellaneous ?[]  - 0 ?External ear exam ?[]  - 0 ?Specimen Collection (cultures, biopsies, blood, body fluids, etc.) ?[]  - 0 ?Specimen(s) / Culture(s) sent or taken to Lab for analysis ?[]  - 0 ?Patient Transfer (multiple staff / / Similar devices) ?[]  - 0 ?Simple Staple / Suture removal (25 or less) ?[]  - 0 ?Complex Staple / Suture removal (26 or more) ?[]  - 0 ?Hypo / Hyperglycemic Management (close monitor of Blood Glucose) ?[]  - 0 ?Ankle / Brachial Index (ABI) - do not check if billed separately ?Has the patient been seen at the hospital within the last three years: Yes ?Total Score: 125 ?Level Of Care: New/Established - Level 4 ?Electronic Signature(s) ?Signed: 03/18/2022 6:05:16 PM By: RN, BSN ?Entered By: on 03/18/2022 09:43:15 ?-------------------------------------------------------------------------------- ?Encounter Discharge Information Details ?Patient Name: Date of Service: ?[name], Dylan NTO NIO Dylan. 03/18/2022 9:00 Dylan M ?Medical Record Number: ?Patient Account Number: ?Date of Birth/Sex: Treating RN: ?25-Jul-1976 (46 y.o. ?Primary Care Jillana Selph: Other Clinician: ?Referring Shatoya Roets: ?Treating Assunta Pupo/Extender: ?03/20/2022 ?Weeks in Treatment: 0 ?Encounter Discharge Information Items ?Discharge Condition: Stable ?Ambulatory Status: Ambulatory ?Discharge Destination: Home ?Transportation: Other ?Accompanied By: self ?Schedule Follow-up Appointment: Yes ?Clinical Summary of Care: Patient Declined ?Notes ?city bus ?Electronic Signature(s) ?Signed: 03/18/2022 6:05:16 PM By: Zenaida Deed RN, BSN ?Entered By: 03/20/2022 on 03/18/2022 09:50:58 ?-------------------------------------------------------------------------------- ?Lower Extremity Assessment Details ?Patient Name: Date of Service: ?[name], Dylan NTO NIO Dylan. 03/18/2022 9:00 Dylan M ?Medical Record Number: [id number] ?Patient Account Number: 07/09/1976 ?Date of Birth/Sex: Treating RN: ?Apr 04, 1976 (46 y.o. Bertram Denver) Duanne Guess ?Primary Care Tysean Vandervliet: Bertram Denver Other Clinician: ?Referring Demarie Uhlig: ?Treating Rebacca Votaw/Extender: 03/20/2022 ?Zenaida Deed ?Weeks in Treatment: 0 ?Edema Assessment ?Assessed: [Left: No] [Right: No] ?Edema: [Left: N] [Right: o] ?Electronic Signature(s) ?Signed: 03/18/2022 5:10:04 PM By: 03/20/2022 RN ?Entered By: 03/20/2022 on 03/18/2022 09:15:15 ?-------------------------------------------------------------------------------- ?Multi Wound Chart Details ?Patient Name: ?Date of Service: ?[name], Dylan NTO NIO Dylan. 03/18/2022 9:00 Dylan M ?Medical Record Number: 07/09/1976 ?Patient Account Number: [id number] ?Date of Birth/Sex: ?Treating RN: ?07-25-1976 (46 y.o. M) ?Primary Care Naythen Heikkila: Bertram Denver ?Other Clinician: ?Referring Gonsalo Cuthbertson: ?Treating Arne Schlender/Extender: Duanne Guess ?Bertram Denver ?Weeks in Treatment: 0 ?Vital Signs ?Height(in): ?Capillary Blood Glucose(mg/dl): 03/20/2022 ?Weight(lbs): ?Pulse(bpm): 88 ?Body Mass Index(BMI): ?Blood Pressure(mmHg): 150/94 ?Temperature(??F): 97.6 ?Respiratory Rate(breaths/min): 18 ?Photos: [N/Dylan:N/Dylan] ?Left Amputation Site - Below Knee N/Dylan N/Dylan ?Wound Location: ?Pressure Injury N/Dylan N/Dylan ?Wounding  Event: ?Diabetic Wound/Ulcer of the Lower N/Dylan N/Dylan ?Primary Etiology: ?Extremity ?Chronic sinus problems/congestion, N/Dylan N/Dylan ?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

## 2022-03-22 ENCOUNTER — Ambulatory Visit (INDEPENDENT_AMBULATORY_CARE_PROVIDER_SITE_OTHER): Payer: 59 | Admitting: Family

## 2022-03-22 ENCOUNTER — Encounter: Payer: Self-pay | Admitting: Family

## 2022-03-22 DIAGNOSIS — Z89512 Acquired absence of left leg below knee: Secondary | ICD-10-CM | POA: Diagnosis not present

## 2022-03-22 DIAGNOSIS — S88119S Complete traumatic amputation at level between knee and ankle, unspecified lower leg, sequela: Secondary | ICD-10-CM
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
## 2022-03-25 ENCOUNTER — Encounter (HOSPITAL_BASED_OUTPATIENT_CLINIC_OR_DEPARTMENT_OTHER): Payer: 59 | Admitting: General Surgery

## 2022-04-04 ENCOUNTER — Encounter: Payer: Self-pay | Admitting: Nurse Practitioner

## 2022-04-04 ENCOUNTER — Other Ambulatory Visit: Payer: Self-pay

## 2022-04-04 ENCOUNTER — Ambulatory Visit: Payer: 59 | Attending: Nurse Practitioner | Admitting: Nurse Practitioner

## 2022-04-04 VITALS — BP 153/103 | HR 82 | Resp 16 | Ht 73.0 in | Wt 214.0 lb

## 2022-04-04 DIAGNOSIS — D649 Anemia, unspecified: Secondary | ICD-10-CM

## 2022-04-04 DIAGNOSIS — E782 Mixed hyperlipidemia: Secondary | ICD-10-CM | POA: Diagnosis not present

## 2022-04-04 DIAGNOSIS — I10 Essential (primary) hypertension: Secondary | ICD-10-CM | POA: Diagnosis not present

## 2022-04-04 DIAGNOSIS — E1165 Type 2 diabetes mellitus with hyperglycemia: Secondary | ICD-10-CM

## 2022-04-04 DIAGNOSIS — Z794 Long term (current) use of insulin: Secondary | ICD-10-CM | POA: Diagnosis not present

## 2022-04-04 LAB — POCT GLYCOSYLATED HEMOGLOBIN (HGB A1C): HbA1c, POC (controlled diabetic range): 8.5 % — AB (ref 0.0–7.0)

## 2022-04-04 LAB — GLUCOSE, POCT (MANUAL RESULT ENTRY): POC Glucose: 215 mg/dl — AB (ref 70–99)

## 2022-04-04 MED ORDER — ATORVASTATIN CALCIUM 20 MG PO TABS
20.0000 mg | ORAL_TABLET | Freq: Every day | ORAL | 3 refills | Status: DC
  Filled 2022-04-04: qty 90, 90d supply, fill #0

## 2022-04-04 MED ORDER — AMLODIPINE BESYLATE 5 MG PO TABS
ORAL_TABLET | Freq: Every day | ORAL | 2 refills | Status: DC
  Filled 2022-04-04: qty 30, 30d supply, fill #0

## 2022-04-04 MED ORDER — DAPAGLIFLOZIN PROPANEDIOL 10 MG PO TABS
10.0000 mg | ORAL_TABLET | Freq: Every day | ORAL | 6 refills | Status: DC
  Filled 2022-04-04: qty 30, 30d supply, fill #0

## 2022-04-04 MED ORDER — NOVOLOG FLEXPEN 100 UNIT/ML ~~LOC~~ SOPN
PEN_INJECTOR | SUBCUTANEOUS | 6 refills | Status: DC
  Filled 2022-04-04: qty 15, fill #0
  Filled 2022-04-28: qty 15, 50d supply, fill #0

## 2022-04-04 MED ORDER — VICTOZA 18 MG/3ML ~~LOC~~ SOPN
PEN_INJECTOR | Freq: Every day | SUBCUTANEOUS | 3 refills | Status: DC
  Filled 2022-04-04: qty 9, fill #0
  Filled 2022-04-28: qty 9, 30d supply, fill #0

## 2022-04-04 MED ORDER — BD PEN NEEDLE MINI U/F 31G X 5 MM MISC
6 refills | Status: DC
  Filled 2022-04-04: qty 200, 40d supply, fill #0

## 2022-04-04 MED ORDER — LOSARTAN POTASSIUM 100 MG PO TABS
ORAL_TABLET | Freq: Every day | ORAL | 1 refills | Status: DC
  Filled 2022-04-04: qty 30, 30d supply, fill #0

## 2022-04-04 MED ORDER — INSULIN GLARGINE-YFGN 100 UNIT/ML ~~LOC~~ SOPN
30.0000 [IU] | PEN_INJECTOR | Freq: Every day | SUBCUTANEOUS | 1 refills | Status: DC
  Filled 2022-04-04: qty 15, 50d supply, fill #0
  Filled 2022-04-28: qty 9, 30d supply, fill #0
  Filled 2022-04-28: qty 15, 50d supply, fill #0
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
## 2022-04-04 NOTE — Progress Notes (Signed)
Fu DM ?Reports several hypoglycemic episodes HS to 50's ?

## 2022-04-05 LAB — CMP14+EGFR
ALT: 31 IU/L (ref 0–44)
AST: 53 IU/L — ABNORMAL HIGH (ref 0–40)
Albumin/Globulin Ratio: 1.1 — ABNORMAL LOW (ref 1.2–2.2)
Albumin: 4.3 g/dL (ref 4.0–5.0)
Alkaline Phosphatase: 129 IU/L — ABNORMAL HIGH (ref 44–121)
BUN/Creatinine Ratio: 8 — ABNORMAL LOW (ref 9–20)
BUN: 7 mg/dL (ref 6–24)
Bilirubin Total: 0.3 mg/dL (ref 0.0–1.2)
CO2: 20 mmol/L (ref 20–29)
Calcium: 9.4 mg/dL (ref 8.7–10.2)
Chloride: 102 mmol/L (ref 96–106)
Creatinine, Ser: 0.89 mg/dL (ref 0.76–1.27)
Globulin, Total: 3.9 g/dL (ref 1.5–4.5)
Glucose: 216 mg/dL — ABNORMAL HIGH (ref 70–99)
Potassium: 4.7 mmol/L (ref 3.5–5.2)
Sodium: 137 mmol/L (ref 134–144)
Total Protein: 8.2 g/dL (ref 6.0–8.5)
eGFR: 107 mL/min/{1.73_m2} (ref >59)

## 2022-04-05 LAB — CBC WITH DIFFERENTIAL/PLATELET
Basophils Absolute: 0 10*3/uL (ref 0.0–0.2)
Basos: 0 %
EOS (ABSOLUTE): 0 10*3/uL (ref 0.0–0.4)
Eos: 1 %
Hematocrit: 35.8 % — ABNORMAL LOW (ref 37.5–51.0)
Hemoglobin: 13 g/dL (ref 13.0–17.7)
Immature Grans (Abs): 0 10*3/uL (ref 0.0–0.1)
Immature Granulocytes: 0 %
Lymphocytes Absolute: 1 10*3/uL (ref 0.7–3.1)
Lymphs: 24 %
MCH: 35.4 pg — ABNORMAL HIGH (ref 26.6–33.0)
MCHC: 36.3 g/dL — ABNORMAL HIGH (ref 31.5–35.7)
MCV: 98 fL — ABNORMAL HIGH (ref 79–97)
Monocytes Absolute: 0.5 10*3/uL (ref 0.1–0.9)
Monocytes: 10 %
Neutrophils Absolute: 2.8 10*3/uL (ref 1.4–7.0)
Neutrophils: 65 %
Platelets: 203 10*3/uL (ref 150–450)
RBC: 3.67 x10E6/uL — ABNORMAL LOW (ref 4.14–5.80)
RDW: 12 % (ref 11.6–15.4)
WBC: 4.3 10*3/uL (ref 3.4–10.8)

## 2022-04-05 LAB — LIPID PANEL
Chol/HDL Ratio: 2.1 ratio (ref 0.0–5.0)
Cholesterol, Total: 194 mg/dL (ref 100–199)
HDL: 94 mg/dL (ref >39)
LDL Chol Calc (NIH): 87 mg/dL (ref 0–99)
Triglycerides: 70 mg/dL (ref 0–149)
VLDL Cholesterol Cal: 13 mg/dL (ref 5–40)

## 2022-04-06 ENCOUNTER — Other Ambulatory Visit: Payer: Self-pay | Admitting: Nurse Practitioner

## 2022-04-06 DIAGNOSIS — Z1211 Encounter for screening for malignant neoplasm of colon: Secondary | ICD-10-CM

## 2022-04-08 ENCOUNTER — Other Ambulatory Visit: Payer: Self-pay

## 2022-04-08 ENCOUNTER — Encounter (HOSPITAL_BASED_OUTPATIENT_CLINIC_OR_DEPARTMENT_OTHER): Payer: 59 | Attending: General Surgery | Admitting: General Surgery

## 2022-04-08 DIAGNOSIS — Z794 Long term (current) use of insulin: Secondary | ICD-10-CM | POA: Diagnosis not present

## 2022-04-08 DIAGNOSIS — L97829 Non-pressure chronic ulcer of other part of left lower leg with unspecified severity: Secondary | ICD-10-CM | POA: Insufficient documentation

## 2022-04-08 DIAGNOSIS — E538 Deficiency of other specified B group vitamins: Secondary | ICD-10-CM | POA: Diagnosis not present

## 2022-04-08 DIAGNOSIS — E11622 Type 2 diabetes mellitus with other skin ulcer: Secondary | ICD-10-CM | POA: Diagnosis present

## 2022-04-08 DIAGNOSIS — Z89512 Acquired absence of left leg below knee: Secondary | ICD-10-CM | POA: Insufficient documentation

## 2022-04-08 NOTE — Progress Notes (Signed)
Wall Wall. ([id number]) ?Visit Report for 04/08/2022 ?Chief Complaint Document Details ?Patient Name: Date of Service: ?Wall Wall, Wall NTO NIO Wall. 04/08/2022 10:30 Wall M ?Medical Record Number: [id number] ?Patient Account Number: [id number] ?Date of Birth/Sex: Treating RN: ?[birth year] (46 y.o. Wall Wall ?Primary Care Provider: Bertram Wall Other Clinician: ?Referring Provider: ?Treating Provider/Extender: Wall Wall ?Wall Wall ?Weeks in Treatment: 3 ?Information Obtained from: Patient ?Chief Complaint ?08/14/18; patient is here for review of wound on the left lateral foot ?03/18/2022: patient here for ulcer on lateral aspect of BKA site (left) ?Electronic Signature(s) ?Signed: 04/08/2022 11:23:10 AM By: Wall Guess MD FACS ?Entered By: Wall Wall on 04/08/2022 11:23:09 ?-------------------------------------------------------------------------------- ?HPI Details ?Patient Name: Date of Service: ?Wall Wall, Wall NTO NIO Wall. 04/08/2022 10:30 Wall M ?Medical Record Number: [id number] ?Patient Account Number: [id number] ?Date of Birth/Sex: Treating RN: ?[birth year] (46 y.o. Wall Wall ?Primary Care Provider: Bertram Wall Other Clinician: ?Referring Provider: ?Treating Provider/Extender: Wall Wall ?Wall Wall ?Weeks in Treatment: 3 ?History of Present Illness ?HPI Description: ADMISSION ?08/14/18 ?This is Wall 46 year old man with presumably type 2 diabetes on insulin. He was in hospital from 8/8 through 07/09/18 with Wall diabetic foot ulcer on the left but this ?was apparently at the base between the left first and second toes. An MRI of the area showed cellulitis but no osteomyelitis. He was seen by orthopedics who ?aspirated 2 cc of pus [Dr. Luiz Wall. [name] showed staph Lugdunensis. He was discharged on doxycycline and cephalexin. He is completed these. He was seen ?in follow-up by Wall orthopedics Dr. Jerl Wall on 8/15 in follow-up. At that point he had already developed another area laterally over the fifth  metatarsal base. At ?that point there was some malodor. He is watching washing the wound with saline and applying gauze. He works in Wall Wall [employer] and has the wear work boots. It ?sounds as though he is on his feet for long hours. ?Patient is Wall type II diabetic although he was diagnosed at age 67 and started immediately on insulin. I am uncertain whether he had Wall DylanWall or anti- ?pancreatic antibodies. He also has B12 deficiency and Wall history of anemia ?08/23/2018; this is Wall patient who we admitted to clinic last week. He had Wall culture that showed group B strep. Unfortunately we did not have Wall phone number for ?him and his pharmacy was not in our computer system. I had prescribed Amoxil but he has not started this as of yet. He does not complain of pain. I think he ?probably has diabetic neuropathy but he does have some sensation. ?08/30/2018; area on the left lateral foot at the base the fifth metatarsal. This is come in nicely this week. We have been using silver alginate. He does not have ?insurance and the options for dressing supplies will be limited. Nevertheless his wound looks better there is certainly less depth to this. He continues to work on ?the foot which makes options also for healing and offloading difficult ?09/06/2018; the left lateral foot at the base of fifth metatarsal. Not nearly as good as Wall week ago. We have been using silver alginate. He is completing his ?antibiotics. Wound is deep probing towards the medial part of his foot. There is no palpable bone ?09/14/2018; left lateral foot at the base of the fifth metatarsal. Culture I did last week showed Wall few Staphylococcus aureus and Wall few Citrobacter Koseri. I will ?start him on ciprofloxacin today which is Wall cost effective antibiotic for this patient who I do not believe  has insurance. He has insoles and is trying to afford ?new boots. He walks on the outside of his foot and I have talked to him about this. He will need Wall follow-up x-ray which  I have arranged today. ?09/25/2018; I prescribed Cipro last week for methicillin sensitive staph aureus and Citrobacter. For some reason they did not pick up the ciprofloxacin but ?seemed to have doxycycline I am not really sure what the issue was here. The wound continues to require debridement. I am going to get an x-ray today. ?10/02/2018. The patient's x-ray did not show bony erosion to suggest osteomyelitis. An MRI was suggested and I agree with this. He started the ciprofloxacin 2 ?days ago which should cover the methicillin sensitive staph and Citrobacter. Unfortunately this was known I think 2 weeks ago. We are using silver alginate to ?the wound. ?READMISSION ?03/18/2022 ?This patient was last seen in our clinic in 2019. 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 Lajoyce Wall. He does wear Wall prosthetic, but works long hours on his feet and goes through socks very quickly. His prosthesis has rubbed and he ?initially had Wall blister at the site we are evaluating today. It got larger and ultimately transformed into Wall wound. He was seen in the emergency department on ?March 06, 2022. No imaging was performed at that time but he was prescribed antibiotics. He followed up in the orthopedic clinic Wall couple of days later. They ?have placed Wall prescription for extra liners as well as Wall new prosthesis. They also ordered him out of work. He has been applying topical antibiotic ointment. ?Since that visit, the wound has been healing, likely secondary to him staying off of the residual limb. He is here today for ongoing evaluation and management ?of the ulcer. ?Compared to the picture taken in the emergency department on April 9, the wound is much smaller and quite superficial at this point. There is no periwound ?erythema, induration, or drainage. It appears to be epithelializing. ?04/08/2022: His wound has healed. ?Electronic  Signature(s) ?Signed: 04/08/2022 11:23:52 AM By: Wall Guess MD FACS ?Entered By: Wall Wall on 04/08/2022 11:23:52 ?-------------------------------------------------------------------------------- ?Physical Exam Details ?Patient Name: Date of Service: ?Wall Wall, Wall NTO NIO Wall. 04/08/2022 10:30 Wall M ?Medical Record Number: [id number] ?Patient Account Number: [id number] ?Date of Birth/Sex: Treating RN: ?13-Jul-1976 (46 y.o. Wall Wall ?Primary Care Provider: Bertram Wall Other Clinician: ?Referring Provider: ?Treating Provider/Extender: Wall Wall ?Wall Wall ?Weeks in Treatment: 3 ?Constitutional ?. . . . No acute distress. ?Respiratory ?Normal work of breathing on room air. ?Notes ?04/08/2022: His wound has healed. ?Electronic Signature(s) ?Signed: 04/08/2022 11:24:23 AM By: Wall Guess MD FACS ?Entered By: Wall Wall on 04/08/2022 11:24:23 ?-------------------------------------------------------------------------------- ?Physician Orders Details ?Patient Name: Date of Service: ?Wall Wall, Wall NTO NIO Wall. 04/08/2022 10:30 Wall M ?Medical Record Number: [id number] ?Patient Account Number: [id number] ?Date of Birth/Sex: Treating RN: ?07-06-1976 (46 y.o. Wall Wall ?Primary Care Provider: Bertram Wall Other Clinician: ?Referring Provider: ?Treating Provider/Extender: Wall Wall ?Wall Wall ?Weeks in Treatment: 3 ?Verbal / Phone Orders: No ?Diagnosis Coding ?ICD-10 Coding ?Code Description ?E11.622 Type 2 diabetes mellitus with other skin ulcer ?I62.703 Acquired absence of left leg below knee ?L97.829 Non-pressure chronic ulcer of other part of left lower leg with unspecified severity ?Discharge From [facility] ?Discharge from Wound Care Center ?Off-Loading ?Other: - protect left stump area with padding wearing prosthesis ?Electronic Signature(s) ?Signed: 04/08/2022 11:24:33 AM By: Wall Guess  MD FACS ?Entered By: Wall Guessannon, Johnette Teigen on 04/08/2022  11:24:33 ?-------------------------------------------------------------------------------- ?Problem List Details ?Patient Name: ?Date of Service: ?Wall Wall, Wall NTO NIO Wall. 04/08/2022 10:30 Wall M ?Medical Record Number: [id number] ?Patient Account Number: [id number]
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
## 2022-04-19 ENCOUNTER — Encounter: Payer: Self-pay | Admitting: Nurse Practitioner

## 2022-04-19 ENCOUNTER — Ambulatory Visit: Payer: 59 | Attending: Nurse Practitioner | Admitting: Nurse Practitioner

## 2022-04-19 DIAGNOSIS — I10 Essential (primary) hypertension: Secondary | ICD-10-CM

## 2022-04-19 MED ORDER — AMLODIPINE BESYLATE 10 MG PO TABS
10.0000 mg | ORAL_TABLET | Freq: Every day | ORAL | 0 refills | Status: DC
  Filled 2022-04-19: qty 90, 90d supply, fill #0
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
## 2022-04-20 ENCOUNTER — Other Ambulatory Visit: Payer: Self-pay

## 2022-04-27 ENCOUNTER — Other Ambulatory Visit: Payer: Self-pay

## 2022-04-28 ENCOUNTER — Other Ambulatory Visit: Payer: Self-pay

## 2022-06-01 ENCOUNTER — Other Ambulatory Visit: Payer: Self-pay | Admitting: Nurse Practitioner

## 2022-06-01 ENCOUNTER — Other Ambulatory Visit: Payer: Self-pay

## 2022-06-01 MED ORDER — TRUE METRIX BLOOD GLUCOSE TEST VI STRP
ORAL_STRIP | 1 refills | Status: DC
  Filled 2022-06-01: qty 100, 50d supply, fill #0

## 2022-06-02 ENCOUNTER — Other Ambulatory Visit: Payer: Self-pay

## 2022-06-06 ENCOUNTER — Other Ambulatory Visit: Payer: Self-pay

## 2022-06-10 IMAGING — CT CT HEAD W/O CM
4 series · 16 of 47 positions shown, 18 images · non-contrast
Comparison: 04/18/2012

CLINICAL DATA: Fell getting out of shower this evening

EXAM:
CT HEAD WITHOUT CONTRAST
CT CERVICAL SPINE WITHOUT CONTRAST
TECHNIQUE: Multidetector CT imaging of the head and cervical spine was
performed following the standard protocol without intravenous
contrast. Multiplanar CT image reconstructions of the cervical spine
were also generated.

[Series 3: head without · axial · non-contrast · 0.46mm/px · z∈[-117,+8]mm · 7 of 35 slices shown, 9 images]
[im 5/35  brain]
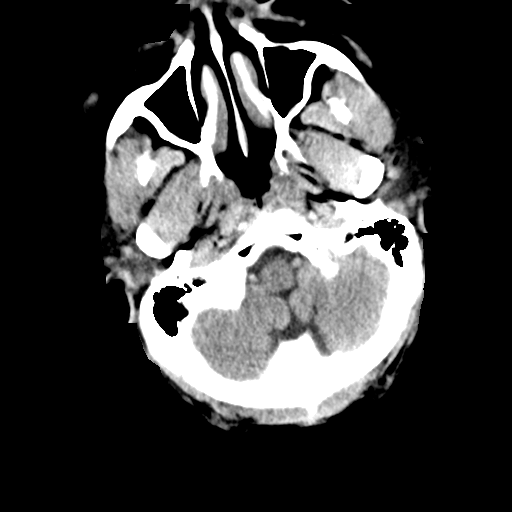
[im 5/35  bone]
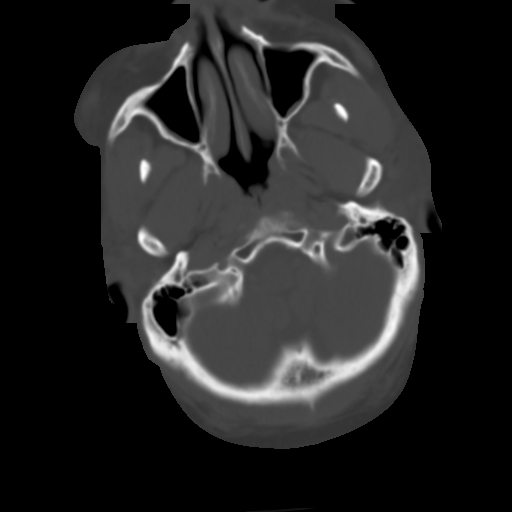
[im 9/35  brain]
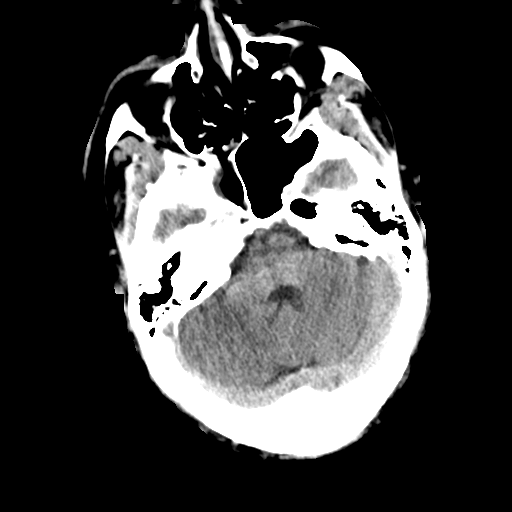
[im 13/35  brain]
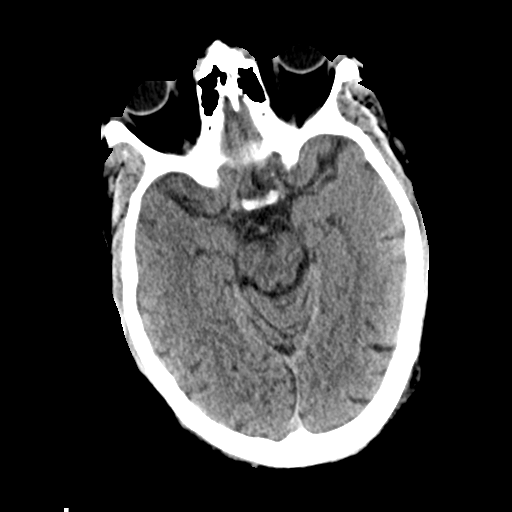
[im 18/35  brain]
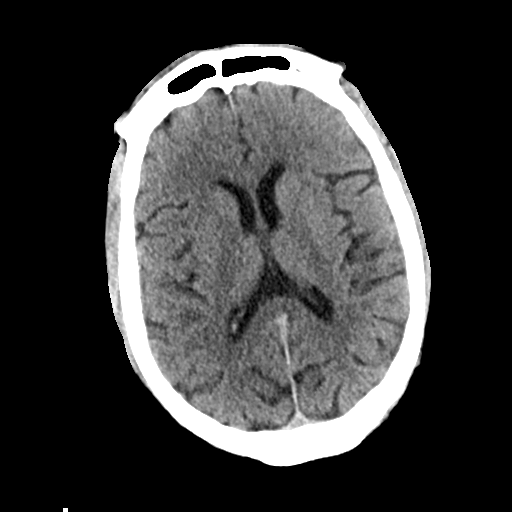
[im 22/35  brain]
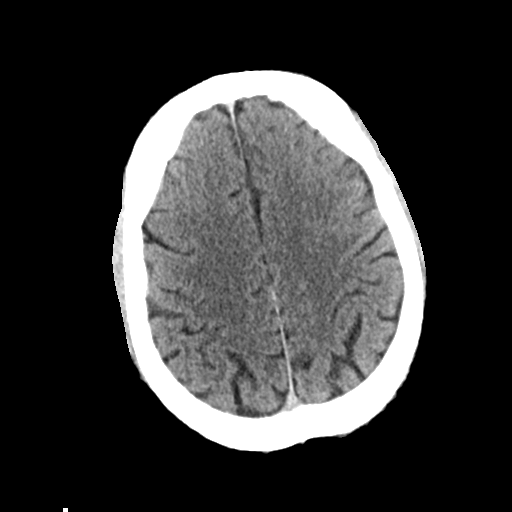
[im 22/35  bone]
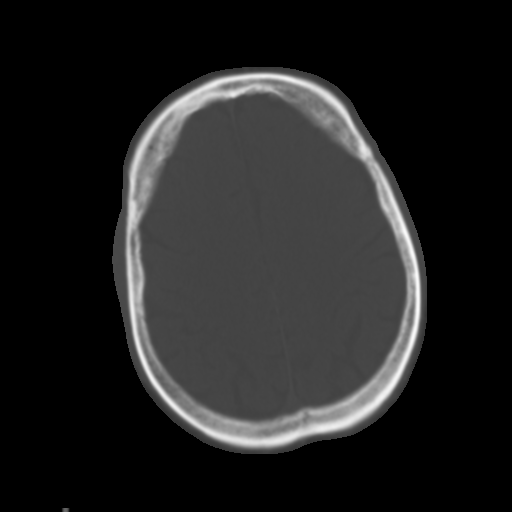
[im 26/35  brain]
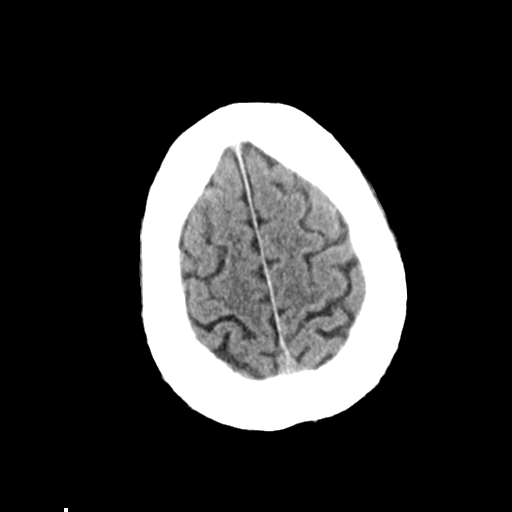
[im 30/35  brain]
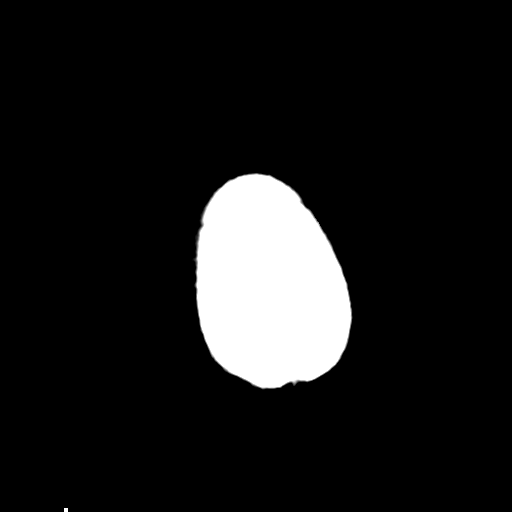

[Series 4: head bone · axial · 0.46mm/px · z∈[-121,-87]mm · 3 of 87 slices shown]
[im 9/87  bone]
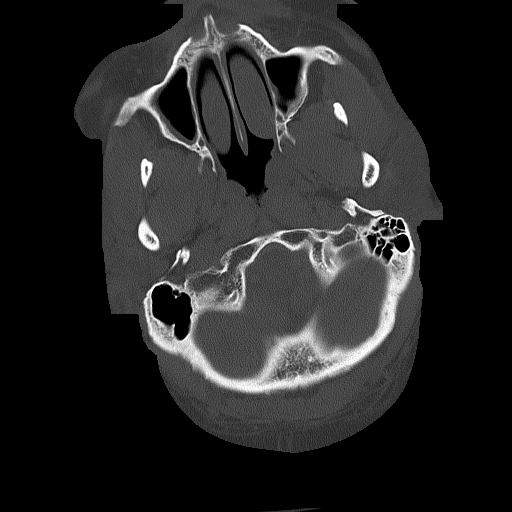
[im 18/87  bone]
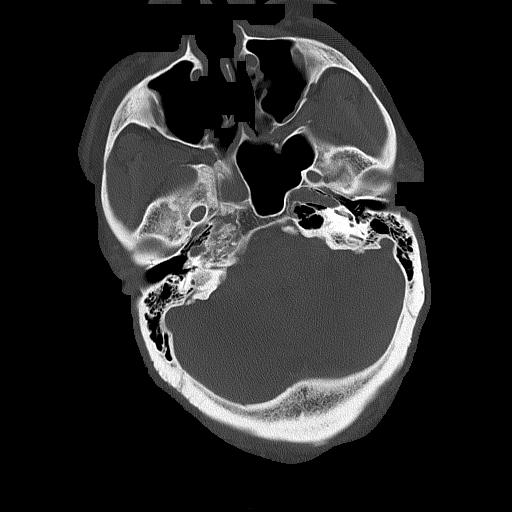
[im 26/87  bone]
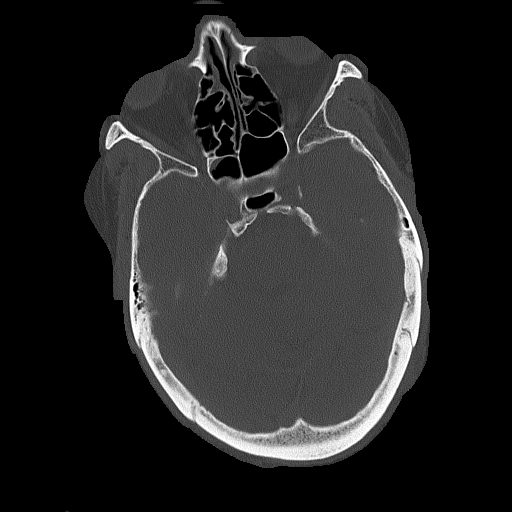

[Series 5: head without cor · coronal · non-contrast · 0.37mm/px · 3 of 80 slices shown]
[im 27/80  brain]
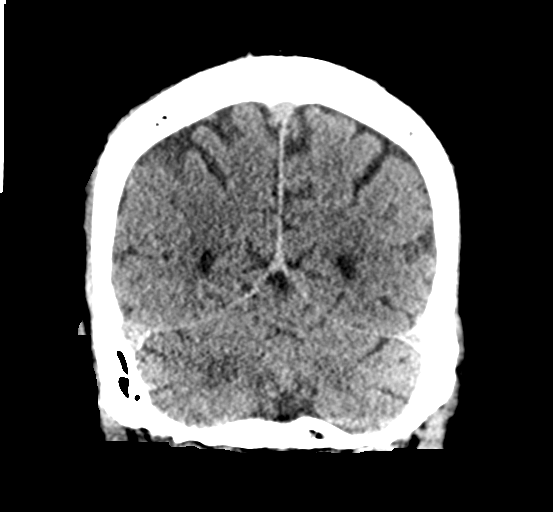
[im 36/80  brain]
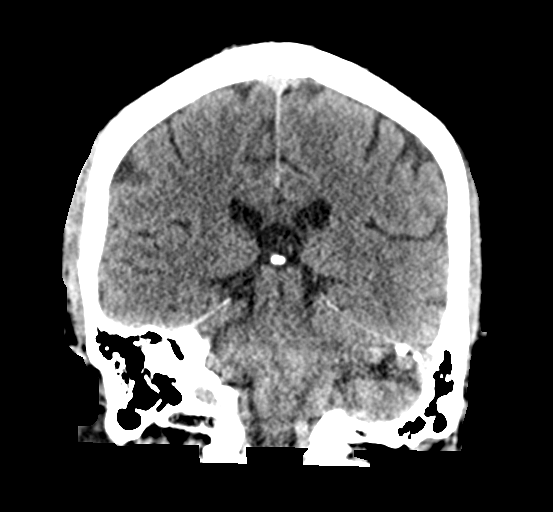
[im 44/80  brain]
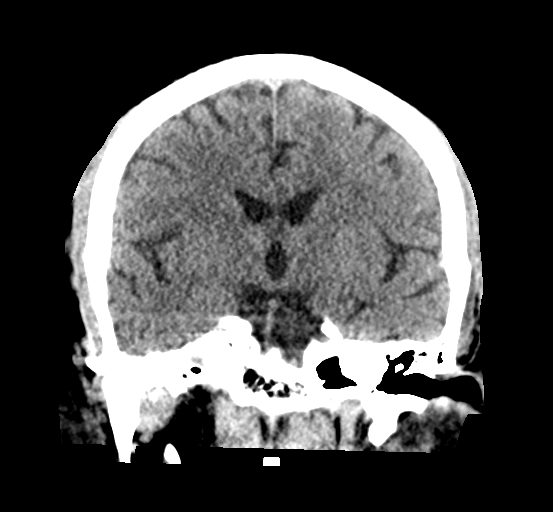

[Series 6: head without sag · sagittal · non-contrast · 0.35mm/px · 3 of 63 slices shown]
[im 21/63  brain]
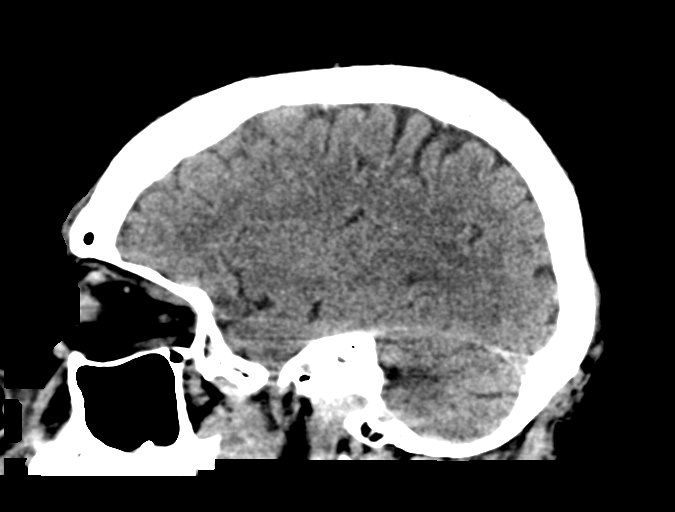
[im 32/63  brain]
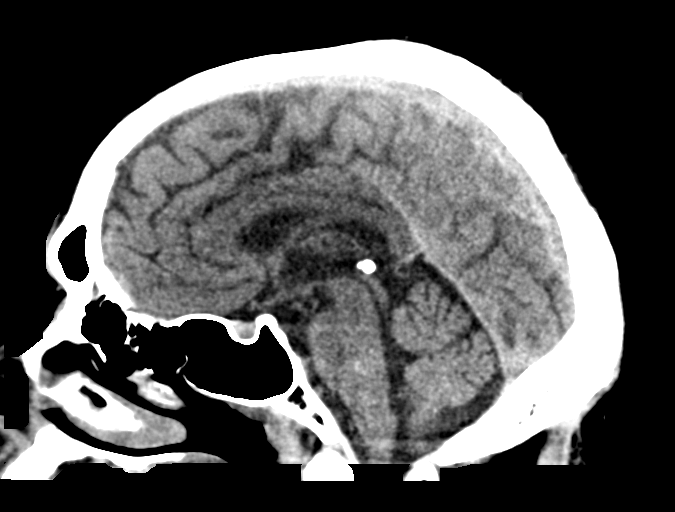
[im 42/63  brain]
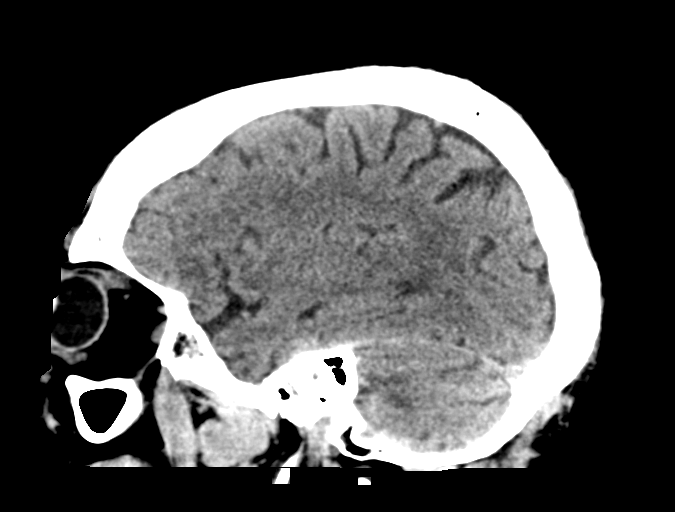

[16 of 47 positions shown; findings below may reference images not displayed]

FINDINGS: CT HEAD FINDINGS

Brain: No acute infarct or hemorrhage. Lateral ventricles and
midline structures are unremarkable. No acute extra-axial fluid
collections. No mass effect.

Vascular: No hyperdense vessel or unexpected calcification.

Skull: Normal. Negative for fracture or focal lesion.

Sinuses/Orbits: Mild mucoperiosteal thickening of the bilateral
maxillary sinuses. Polypoid thickening is seen within the right
sphenoid sinus.

Other: None.

CT CERVICAL SPINE FINDINGS

Alignment: Alignment is anatomic.

Skull base and vertebrae: No acute displaced fractures.

Soft tissues and spinal canal: No prevertebral fluid or swelling. No
visible canal hematoma.

Disc levels: Left predominant disc osteophyte complex and
uncovertebral hypertrophy at C2/C3 result in left-sided neural
foraminal encroachment. Remaining levels are unremarkable.

Upper chest: Airway is patent.  Lung apices are clear.

Other: Reconstructed images demonstrate no additional findings.
IMPRESSION: 1. No acute intracranial process.
2. No acute cervical spine fracture.

## 2022-07-08 ENCOUNTER — Other Ambulatory Visit: Payer: Self-pay | Admitting: Nurse Practitioner

## 2022-07-08 ENCOUNTER — Ambulatory Visit: Payer: Commercial Managed Care - HMO | Attending: Nurse Practitioner | Admitting: Nurse Practitioner

## 2022-07-08 ENCOUNTER — Other Ambulatory Visit: Payer: Self-pay

## 2022-07-08 VITALS — BP 101/74 | HR 96 | Temp 98.6°F | Resp 12 | Ht 72.0 in | Wt 207.0 lb

## 2022-07-08 DIAGNOSIS — E782 Mixed hyperlipidemia: Secondary | ICD-10-CM

## 2022-07-08 DIAGNOSIS — E1165 Type 2 diabetes mellitus with hyperglycemia: Secondary | ICD-10-CM

## 2022-07-08 DIAGNOSIS — I10 Essential (primary) hypertension: Secondary | ICD-10-CM | POA: Diagnosis not present

## 2022-07-08 DIAGNOSIS — Z794 Long term (current) use of insulin: Secondary | ICD-10-CM

## 2022-07-08 LAB — POCT GLYCOSYLATED HEMOGLOBIN (HGB A1C): Hemoglobin A1C: 7.9 % — AB (ref 4.0–5.6)

## 2022-07-08 MED ORDER — DAPAGLIFLOZIN PROPANEDIOL 10 MG PO TABS
10.0000 mg | ORAL_TABLET | Freq: Every day | ORAL | 1 refills | Status: DC
  Filled 2022-07-08: qty 30, 30d supply, fill #0

## 2022-07-08 MED ORDER — ATORVASTATIN CALCIUM 20 MG PO TABS
20.0000 mg | ORAL_TABLET | Freq: Every day | ORAL | 3 refills | Status: DC
  Filled 2022-07-08: qty 30, 30d supply, fill #0

## 2022-07-08 MED ORDER — VICTOZA 18 MG/3ML ~~LOC~~ SOPN
1.8000 mg | PEN_INJECTOR | Freq: Every day | SUBCUTANEOUS | 3 refills | Status: DC
  Filled 2022-07-08 – 2022-07-12 (×3): qty 9, 30d supply, fill #0
  Filled 2022-07-12: qty 21, 70d supply, fill #0

## 2022-07-08 MED ORDER — AMLODIPINE BESYLATE 10 MG PO TABS
10.0000 mg | ORAL_TABLET | Freq: Every day | ORAL | 1 refills | Status: DC
  Filled 2022-07-08: qty 30, 30d supply, fill #0
  Filled 2022-11-01: qty 30, 30d supply, fill #1

## 2022-07-08 MED ORDER — NOVOLOG FLEXPEN 100 UNIT/ML ~~LOC~~ SOPN
PEN_INJECTOR | SUBCUTANEOUS | 6 refills | Status: DC
  Filled 2022-07-08: qty 9, 30d supply, fill #0

## 2022-07-08 MED ORDER — LOSARTAN POTASSIUM 100 MG PO TABS
ORAL_TABLET | Freq: Every day | ORAL | 1 refills | Status: DC
Start: 2022-07-08 — End: 2023-02-28
  Filled 2022-07-08: qty 30, 30d supply, fill #0
  Filled 2022-11-01: qty 30, 30d supply, fill #1

## 2022-07-08 MED ORDER — BASAGLAR KWIKPEN 100 UNIT/ML ~~LOC~~ SOPN
30.0000 [IU] | PEN_INJECTOR | Freq: Every day | SUBCUTANEOUS | 1 refills | Status: DC
Start: 2022-07-08 — End: 2022-12-15
  Filled 2022-07-08: qty 9, 30d supply, fill #0
  Filled 2022-08-12: qty 9, 30d supply, fill #1
  Filled 2022-11-01: qty 15, 50d supply, fill #2
  Filled 2022-11-04: qty 9, 30d supply, fill #2
  Filled 2022-12-15: qty 9, 30d supply, fill #3

## 2022-07-08 MED ORDER — INSULIN GLARGINE-YFGN 100 UNIT/ML ~~LOC~~ SOPN
30.0000 [IU] | PEN_INJECTOR | Freq: Every day | SUBCUTANEOUS | 1 refills | Status: DC
  Filled 2022-07-08: qty 9, 30d supply, fill #0

## 2022-07-08 NOTE — Progress Notes (Signed)
Basaglar
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
## 2022-07-09 LAB — CMP14+EGFR
ALT: 43 IU/L (ref 0–44)
AST: 60 IU/L — ABNORMAL HIGH (ref 0–40)
Albumin/Globulin Ratio: 1.2 (ref 1.2–2.2)
Albumin: 4.7 g/dL (ref 4.1–5.1)
Alkaline Phosphatase: 127 IU/L — ABNORMAL HIGH (ref 44–121)
BUN/Creatinine Ratio: 14 (ref 9–20)
BUN: 16 mg/dL (ref 6–24)
Bilirubin Total: 1.1 mg/dL (ref 0.0–1.2)
CO2: 20 mmol/L (ref 20–29)
Calcium: 10 mg/dL (ref 8.7–10.2)
Chloride: 95 mmol/L — ABNORMAL LOW (ref 96–106)
Creatinine, Ser: 1.13 mg/dL (ref 0.76–1.27)
Globulin, Total: 3.9 g/dL (ref 1.5–4.5)
Glucose: 195 mg/dL — ABNORMAL HIGH (ref 70–99)
Potassium: 4.1 mmol/L (ref 3.5–5.2)
Sodium: 135 mmol/L (ref 134–144)
Total Protein: 8.6 g/dL — ABNORMAL HIGH (ref 6.0–8.5)
eGFR: 81 mL/min/{1.73_m2} (ref >59)

## 2022-07-11 ENCOUNTER — Other Ambulatory Visit: Payer: Self-pay | Admitting: Pharmacist

## 2022-07-11 ENCOUNTER — Encounter: Payer: Self-pay | Admitting: Nurse Practitioner

## 2022-07-11 ENCOUNTER — Other Ambulatory Visit: Payer: Self-pay

## 2022-07-11 MED ORDER — INSULIN LISPRO (1 UNIT DIAL) 100 UNIT/ML (KWIKPEN)
10.0000 [IU] | PEN_INJECTOR | Freq: Three times a day (TID) | SUBCUTANEOUS | 1 refills | Status: DC
  Filled 2022-07-11: qty 9, 30d supply, fill #0
  Filled 2022-11-01: qty 9, 30d supply, fill #1
  Filled 2022-12-15: qty 9, 30d supply, fill #2

## 2022-07-12 ENCOUNTER — Other Ambulatory Visit: Payer: Self-pay

## 2022-07-14 ENCOUNTER — Other Ambulatory Visit: Payer: Self-pay

## 2022-07-20 ENCOUNTER — Telehealth: Payer: Self-pay

## 2022-07-20 ENCOUNTER — Other Ambulatory Visit: Payer: Self-pay

## 2022-07-20 MED ORDER — TRULICITY 1.5 MG/0.5ML ~~LOC~~ SOAJ
1.5000 mg | SUBCUTANEOUS | 2 refills | Status: DC
Start: 2022-07-20 — End: 2022-11-04
  Filled 2022-07-20 – 2022-11-04 (×6): qty 2, 28d supply, fill #0

## 2022-07-20 NOTE — Telephone Encounter (Signed)
Rx sent for Trulicity

## 2022-07-20 NOTE — Telephone Encounter (Signed)
VICTOZA PA HAS BEEN DENIED.  PATIENT'S INS REQUIRES TRIAL AND FAILURE OF TRULICITY AND BYETTA/BYDUREON.  IF APPROPRIATE, PLEASE CHANGE THERAPY TO PREFERRED MEDICATION.

## 2022-07-20 NOTE — Addendum Note (Signed)
Addended by: Lois Huxley, Jeannett Senior L on: 07/20/2022 04:20 PM   Modules accepted: Orders

## 2022-07-21 ENCOUNTER — Other Ambulatory Visit: Payer: Self-pay

## 2022-07-26 ENCOUNTER — Other Ambulatory Visit: Payer: Self-pay

## 2022-08-03 ENCOUNTER — Encounter (HOSPITAL_COMMUNITY): Payer: Self-pay

## 2022-08-03 ENCOUNTER — Other Ambulatory Visit: Payer: Self-pay

## 2022-08-03 ENCOUNTER — Ambulatory Visit (INDEPENDENT_AMBULATORY_CARE_PROVIDER_SITE_OTHER): Payer: Commercial Managed Care - HMO | Admitting: Family

## 2022-08-03 ENCOUNTER — Ambulatory Visit (HOSPITAL_COMMUNITY)
Admission: EM | Admit: 2022-08-03 | Discharge: 2022-08-03 | Disposition: A | Discharge status: Home / Self Care | Payer: Commercial Managed Care - HMO | Attending: Internal Medicine | Admitting: Internal Medicine

## 2022-08-03 ENCOUNTER — Encounter: Payer: Self-pay | Admitting: Family

## 2022-08-03 ENCOUNTER — Telehealth (HOSPITAL_COMMUNITY): Payer: Self-pay | Admitting: Emergency Medicine

## 2022-08-03 DIAGNOSIS — L03116 Cellulitis of left lower limb: Secondary | ICD-10-CM | POA: Diagnosis not present

## 2022-08-03 DIAGNOSIS — L02416 Cutaneous abscess of left lower limb: Secondary | ICD-10-CM | POA: Diagnosis not present

## 2022-08-03 DIAGNOSIS — Z89512 Acquired absence of left leg below knee: Secondary | ICD-10-CM | POA: Diagnosis not present

## 2022-08-03 MED ORDER — DOXYCYCLINE HYCLATE 100 MG PO CAPS: 100.0000 mg | ORAL_CAPSULE | Freq: Two times a day (BID) | ORAL | 0 refills | Status: DC

## 2022-08-03 MED ORDER — DOXYCYCLINE HYCLATE 100 MG PO CAPS
100.0000 mg | ORAL_CAPSULE | Freq: Two times a day (BID) | ORAL | 0 refills | Status: DC
  Filled 2022-08-03: qty 14, 7d supply, fill #0

## 2022-08-03 MED ORDER — DOXYCYCLINE HYCLATE 100 MG PO CAPS: 100.0000 mg | ORAL_CAPSULE | Freq: Two times a day (BID) | ORAL | 0 refills | Status: AC
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
## 2022-08-03 NOTE — Discharge Instructions (Signed)
Continue using warm compresses over the affected area to help it drain Please take antibiotics as directed Tylenol as needed for pain Return to urgent care if you have worsening swelling, drainage, pain, fever or chills.

## 2022-08-03 NOTE — Telephone Encounter (Signed)
Patient needed prescription sent to a different pharmacy 

## 2022-08-03 NOTE — ED Triage Notes (Signed)
Pt is here for a possible abscess on rthe left leg with drainage x2 days

## 2022-08-04 ENCOUNTER — Telehealth: Payer: Self-pay

## 2022-08-04 ENCOUNTER — Other Ambulatory Visit: Payer: Self-pay

## 2022-08-04 NOTE — Telephone Encounter (Signed)
Fleet Contras called stating that patient's patient left stump is swollen and wanted to know if its best to use ice and heat.  CB# [phone].  Please advise.  Thank you.
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
## 2022-08-04 NOTE — Telephone Encounter (Signed)
Mail box is full and not able to leave a message. Will hold and try again.

## 2022-08-05 NOTE — Telephone Encounter (Signed)
Called to advise can alternate between the two and use what feels best. Will call with any questions or changes.

## 2022-08-09 ENCOUNTER — Ambulatory Visit (INDEPENDENT_AMBULATORY_CARE_PROVIDER_SITE_OTHER): Payer: Commercial Managed Care - HMO | Admitting: Family

## 2022-08-09 DIAGNOSIS — Z89512 Acquired absence of left leg below knee: Secondary | ICD-10-CM

## 2022-08-09 DIAGNOSIS — S88119S Complete traumatic amputation at level between knee and ankle, unspecified lower leg, sequela: Secondary | ICD-10-CM
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
## 2022-08-12 ENCOUNTER — Encounter: Payer: Self-pay | Admitting: Family

## 2022-08-12 ENCOUNTER — Other Ambulatory Visit: Payer: Self-pay

## 2022-09-20 ENCOUNTER — Other Ambulatory Visit: Payer: Self-pay

## 2022-10-17 ENCOUNTER — Encounter: Payer: Self-pay | Admitting: Nurse Practitioner

## 2022-10-17 ENCOUNTER — Ambulatory Visit: Payer: Medicaid Other | Attending: Nurse Practitioner | Admitting: Nurse Practitioner

## 2022-10-17 VITALS — BP 125/79 | HR 87 | Temp 98.0°F | Ht 72.0 in | Wt 208.6 lb

## 2022-10-17 DIAGNOSIS — D649 Anemia, unspecified: Secondary | ICD-10-CM

## 2022-10-17 DIAGNOSIS — Z794 Long term (current) use of insulin: Secondary | ICD-10-CM

## 2022-10-17 DIAGNOSIS — E1165 Type 2 diabetes mellitus with hyperglycemia: Secondary | ICD-10-CM

## 2022-10-17 DIAGNOSIS — I10 Essential (primary) hypertension: Secondary | ICD-10-CM

## 2022-10-17 LAB — POCT GLYCOSYLATED HEMOGLOBIN (HGB A1C): Hemoglobin A1C: 8.5 % — AB (ref 4.0–5.6)
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
## 2022-10-18 LAB — CMP14+EGFR
ALT: 34 IU/L (ref 0–44)
AST: 52 IU/L — ABNORMAL HIGH (ref 0–40)
Albumin/Globulin Ratio: 1.1 — ABNORMAL LOW (ref 1.2–2.2)
Albumin: 4.3 g/dL (ref 4.1–5.1)
Alkaline Phosphatase: 129 IU/L — ABNORMAL HIGH (ref 44–121)
BUN/Creatinine Ratio: 9 (ref 9–20)
BUN: 8 mg/dL (ref 6–24)
Bilirubin Total: 0.5 mg/dL (ref 0.0–1.2)
CO2: 21 mmol/L (ref 20–29)
Calcium: 10.1 mg/dL (ref 8.7–10.2)
Chloride: 98 mmol/L (ref 96–106)
Creatinine, Ser: 0.89 mg/dL (ref 0.76–1.27)
Globulin, Total: 3.8 g/dL (ref 1.5–4.5)
Glucose: 113 mg/dL — ABNORMAL HIGH (ref 70–99)
Potassium: 4.5 mmol/L (ref 3.5–5.2)
Sodium: 138 mmol/L (ref 134–144)
Total Protein: 8.1 g/dL (ref 6.0–8.5)
eGFR: 106 mL/min/{1.73_m2} (ref >59)

## 2022-10-18 LAB — CBC WITH DIFFERENTIAL/PLATELET
Basophils Absolute: 0 10*3/uL (ref 0.0–0.2)
Basos: 1 %
EOS (ABSOLUTE): 0.1 10*3/uL (ref 0.0–0.4)
Eos: 1 %
Hematocrit: 40.2 % (ref 37.5–51.0)
Hemoglobin: 13.7 g/dL (ref 13.0–17.7)
Immature Grans (Abs): 0 10*3/uL (ref 0.0–0.1)
Immature Granulocytes: 0 %
Lymphocytes Absolute: 1.3 10*3/uL (ref 0.7–3.1)
Lymphs: 31 %
MCH: 34.2 pg — ABNORMAL HIGH (ref 26.6–33.0)
MCHC: 34.1 g/dL (ref 31.5–35.7)
MCV: 100 fL — ABNORMAL HIGH (ref 79–97)
Monocytes Absolute: 0.7 10*3/uL (ref 0.1–0.9)
Monocytes: 18 %
Neutrophils Absolute: 2.1 10*3/uL (ref 1.4–7.0)
Neutrophils: 49 %
Platelets: 239 10*3/uL (ref 150–450)
RBC: 4.01 x10E6/uL — ABNORMAL LOW (ref 4.14–5.80)
RDW: 12.3 % (ref 11.6–15.4)
WBC: 4.1 10*3/uL (ref 3.4–10.8)

## 2022-10-18 LAB — MICROALBUMIN / CREATININE URINE RATIO
Creatinine, Urine: 62.1 mg/dL
Microalb/Creat Ratio: 13 mg/g creat (ref 0–29)
Microalbumin, Urine: 8 ug/mL

## 2022-11-01 ENCOUNTER — Other Ambulatory Visit: Payer: Self-pay

## 2022-11-02 ENCOUNTER — Other Ambulatory Visit: Payer: Self-pay

## 2022-11-03 ENCOUNTER — Other Ambulatory Visit: Payer: Self-pay

## 2022-11-04 ENCOUNTER — Other Ambulatory Visit: Payer: Self-pay | Admitting: Pharmacist

## 2022-11-04 ENCOUNTER — Other Ambulatory Visit: Payer: Self-pay

## 2022-11-04 MED ORDER — TRULICITY 1.5 MG/0.5ML ~~LOC~~ SOAJ
1.5000 mg | SUBCUTANEOUS | 0 refills | Status: DC
  Filled 2022-11-04: qty 2, 28d supply, fill #0

## 2022-11-04 MED ORDER — OZEMPIC (0.25 OR 0.5 MG/DOSE) 2 MG/3ML ~~LOC~~ SOPN
0.5000 mg | PEN_INJECTOR | SUBCUTANEOUS | 1 refills | Status: DC
  Filled 2022-11-04: qty 3, 28d supply, fill #0

## 2022-11-07 ENCOUNTER — Encounter: Payer: Self-pay | Admitting: Orthopedic Surgery

## 2022-11-07 ENCOUNTER — Ambulatory Visit (INDEPENDENT_AMBULATORY_CARE_PROVIDER_SITE_OTHER): Payer: Medicaid Other | Admitting: Orthopedic Surgery

## 2022-11-07 ENCOUNTER — Other Ambulatory Visit: Payer: Self-pay

## 2022-11-07 DIAGNOSIS — Z89512 Acquired absence of left leg below knee: Secondary | ICD-10-CM | POA: Diagnosis not present
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
## 2022-11-08 ENCOUNTER — Telehealth: Payer: Self-pay | Admitting: Orthopedic Surgery

## 2022-11-08 NOTE — Telephone Encounter (Signed)
Patients wife asking if Dr. Lajoyce Corners or Denny Peon can write a RX for a shower chair and a slip mat. Please advise..[phone]

## 2022-11-09 DIAGNOSIS — Z89512 Acquired absence of left leg below knee: Secondary | ICD-10-CM | POA: Diagnosis not present

## 2022-11-09 DIAGNOSIS — R262 Difficulty in walking, not elsewhere classified: Secondary | ICD-10-CM | POA: Diagnosis not present

## 2022-11-09 DIAGNOSIS — M6281 Muscle weakness (generalized): Secondary | ICD-10-CM | POA: Diagnosis not present

## 2022-11-09 NOTE — Telephone Encounter (Signed)
I called to advise that order in parachute for shower chair. Advised that there is not a slip mat available in the products that they offered but that they will call to discuss any out of pocket expense and delivery with the pt and that they must speak with him or they will not send out the equipment. Pt will call with any questions.

## 2022-11-15 ENCOUNTER — Other Ambulatory Visit: Payer: Self-pay

## 2022-12-02 ENCOUNTER — Other Ambulatory Visit: Payer: Self-pay

## 2022-12-02 ENCOUNTER — Other Ambulatory Visit: Payer: Self-pay | Admitting: Nurse Practitioner

## 2022-12-02 MED ORDER — TRULICITY 1.5 MG/0.5ML ~~LOC~~ SOAJ
1.5000 mg | SUBCUTANEOUS | 1 refills | Status: DC
  Filled 2022-12-02: qty 2, 28d supply, fill #0

## 2022-12-02 NOTE — Telephone Encounter (Signed)
Requested Prescriptions  Pending Prescriptions Disp Refills   Dulaglutide (TRULICITY) 1.5 RC/1.6LA SOPN 2 mL 1    Sig: Inject 1.5 mg into the skin once a week.     Endocrinology:  Diabetes - GLP-1 Receptor Agonists Failed - 12/02/2022 11:11 AM      Failed - HBA1C is between 0 and 7.9 and within 180 days    Hemoglobin A1C  Date Value Ref Range Status  10/17/2022 8.5 (A) 4.0 - 5.6 % Final   HbA1c, POC (controlled diabetic range)  Date Value Ref Range Status  04/04/2022 8.5 (A) 0.0 - 7.0 % Final         Passed - Valid encounter within last 6 months    Recent Outpatient Visits           1 month ago Type 2 diabetes mellitus with hyperglycemia, with long-term current use of insulin [facility])   [place], Vernia Buff, NP   4 months ago Essential hypertension   [place], Vernia Buff, NP   7 months ago Essential hypertension   [place], [place] W, NP   8 months ago Type 2 diabetes mellitus with hyperglycemia, with long-term current use of insulin [facility])   [place], [place] W, NP   1 year ago Type 2 diabetes mellitus with hyperglycemia, with long-term current use of insulin [facility])   [place], Vernia Buff, NP       Future Appointments             In 1 month Gildardo Pounds, NP [place]

## 2022-12-05 ENCOUNTER — Other Ambulatory Visit: Payer: Self-pay

## 2022-12-05 DIAGNOSIS — H5213 Myopia, bilateral: Secondary | ICD-10-CM | POA: Diagnosis not present

## 2022-12-07 ENCOUNTER — Other Ambulatory Visit: Payer: Self-pay

## 2022-12-07 DIAGNOSIS — H5213 Myopia, bilateral: Secondary | ICD-10-CM | POA: Diagnosis not present

## 2022-12-08 ENCOUNTER — Other Ambulatory Visit: Payer: Self-pay

## 2022-12-09 ENCOUNTER — Other Ambulatory Visit: Payer: Self-pay

## 2022-12-13 ENCOUNTER — Other Ambulatory Visit: Payer: Self-pay

## 2022-12-15 ENCOUNTER — Other Ambulatory Visit: Payer: Self-pay | Admitting: Pharmacist

## 2022-12-15 ENCOUNTER — Other Ambulatory Visit: Payer: Self-pay

## 2022-12-15 MED ORDER — LANTUS SOLOSTAR 100 UNIT/ML ~~LOC~~ SOPN
30.0000 [IU] | PEN_INJECTOR | Freq: Every day | SUBCUTANEOUS | 0 refills | Status: DC
  Filled 2022-12-15: qty 15, 50d supply, fill #0

## 2023-01-11 DIAGNOSIS — Z89512 Acquired absence of left leg below knee: Secondary | ICD-10-CM | POA: Diagnosis not present

## 2023-01-17 ENCOUNTER — Ambulatory Visit: Payer: Medicaid Other | Admitting: Nurse Practitioner

## 2023-02-03 ENCOUNTER — Other Ambulatory Visit: Payer: Self-pay

## 2023-02-28 ENCOUNTER — Ambulatory Visit: Payer: Medicaid Other | Attending: Nurse Practitioner | Admitting: Nurse Practitioner

## 2023-02-28 ENCOUNTER — Encounter: Payer: Self-pay | Admitting: Nurse Practitioner

## 2023-02-28 ENCOUNTER — Other Ambulatory Visit: Payer: Self-pay

## 2023-02-28 VITALS — BP 128/83 | HR 77 | Ht 72.0 in | Wt 216.8 lb

## 2023-02-28 DIAGNOSIS — D649 Anemia, unspecified: Secondary | ICD-10-CM

## 2023-02-28 DIAGNOSIS — Z23 Encounter for immunization: Secondary | ICD-10-CM | POA: Diagnosis not present

## 2023-02-28 DIAGNOSIS — I10 Essential (primary) hypertension: Secondary | ICD-10-CM | POA: Diagnosis not present

## 2023-02-28 DIAGNOSIS — Z1211 Encounter for screening for malignant neoplasm of colon: Secondary | ICD-10-CM

## 2023-02-28 DIAGNOSIS — E1165 Type 2 diabetes mellitus with hyperglycemia: Secondary | ICD-10-CM

## 2023-02-28 DIAGNOSIS — Z794 Long term (current) use of insulin: Secondary | ICD-10-CM | POA: Diagnosis not present

## 2023-02-28 DIAGNOSIS — E7800 Pure hypercholesterolemia, unspecified: Secondary | ICD-10-CM

## 2023-02-28 LAB — POCT GLYCOSYLATED HEMOGLOBIN (HGB A1C): Hemoglobin A1C: 8.4 % — AB (ref 4.0–5.6)

## 2023-02-28 MED ORDER — TRUE METRIX BLOOD GLUCOSE TEST VI STRP
ORAL_STRIP | 1 refills | Status: DC
  Filled 2023-02-28: qty 100, fill #0

## 2023-02-28 MED ORDER — AMLODIPINE BESYLATE 10 MG PO TABS
10.0000 mg | ORAL_TABLET | Freq: Every day | ORAL | 1 refills | Status: DC
Start: 2023-02-28 — End: 2023-12-12
  Filled 2023-02-28: qty 90, 90d supply, fill #0

## 2023-02-28 MED ORDER — LOSARTAN POTASSIUM 100 MG PO TABS
100.0000 mg | ORAL_TABLET | Freq: Every day | ORAL | 1 refills | Status: DC
  Filled 2023-02-28: qty 90, 90d supply, fill #0

## 2023-02-28 MED ORDER — HUMULIN 70/30 KWIKPEN (70-30) 100 UNIT/ML ~~LOC~~ SUPN
15.0000 [IU] | PEN_INJECTOR | Freq: Two times a day (BID) | SUBCUTANEOUS | 11 refills | Status: DC
  Filled 2023-02-28: qty 15, 50d supply, fill #0
  Filled 2023-05-15 – 2023-05-16 (×2): qty 15, 50d supply, fill #1

## 2023-02-28 MED ORDER — DAPAGLIFLOZIN PROPANEDIOL 10 MG PO TABS
10.0000 mg | ORAL_TABLET | Freq: Every day | ORAL | 1 refills | Status: DC
Start: 2023-02-28 — End: 2024-05-01
  Filled 2023-02-28: qty 90, 90d supply, fill #0
  Filled 2023-12-25: qty 90, 90d supply, fill #1

## 2023-02-28 MED ORDER — ACCU-CHEK GUIDE ME W/DEVICE KIT
1.0000 | PACK | Freq: Once | 0 refills | Status: AC
Start: 2023-02-28 — End: 2023-03-04
  Filled 2023-02-28: qty 1, 30d supply, fill #0

## 2023-02-28 MED ORDER — ATORVASTATIN CALCIUM 20 MG PO TABS
20.0000 mg | ORAL_TABLET | Freq: Every day | ORAL | 3 refills | Status: DC
Start: 2023-02-28 — End: 2023-12-12
  Filled 2023-02-28: qty 90, 90d supply, fill #0

## 2023-02-28 MED ORDER — ACCU-CHEK SOFTCLIX LANCETS MISC
3 refills | Status: AC
Start: 2023-02-28 — End: ?
  Filled 2023-02-28: qty 100, 50d supply, fill #0

## 2023-02-28 MED ORDER — ACCU-CHEK GUIDE VI STRP
ORAL_STRIP | 12 refills | Status: DC
Start: 2023-02-28 — End: 2023-05-31
  Filled 2023-02-28: qty 100, 50d supply, fill #0

## 2023-02-28 MED ORDER — BD PEN NEEDLE MINI U/F 31G X 5 MM MISC
6 refills | Status: AC
  Filled 2023-02-28: qty 200, 40d supply, fill #0

## 2023-02-28 NOTE — Progress Notes (Signed)
60
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
## 2023-03-01 ENCOUNTER — Other Ambulatory Visit: Payer: Self-pay

## 2023-03-01 ENCOUNTER — Other Ambulatory Visit: Payer: Self-pay | Admitting: Nurse Practitioner

## 2023-03-01 DIAGNOSIS — R748 Abnormal levels of other serum enzymes: Secondary | ICD-10-CM

## 2023-03-01 DIAGNOSIS — Z1211 Encounter for screening for malignant neoplasm of colon: Secondary | ICD-10-CM

## 2023-03-01 LAB — CBC WITH DIFFERENTIAL/PLATELET
Basophils Absolute: 0 10*3/uL (ref 0.0–0.2)
Basos: 1 %
EOS (ABSOLUTE): 0 10*3/uL (ref 0.0–0.4)
Eos: 1 %
Hematocrit: 37.8 % (ref 37.5–51.0)
Hemoglobin: 12.9 g/dL — ABNORMAL LOW (ref 13.0–17.7)
Immature Grans (Abs): 0 10*3/uL (ref 0.0–0.1)
Immature Granulocytes: 0 %
Lymphocytes Absolute: 1.5 10*3/uL (ref 0.7–3.1)
Lymphs: 41 %
MCH: 34.9 pg — ABNORMAL HIGH (ref 26.6–33.0)
MCHC: 34.1 g/dL (ref 31.5–35.7)
MCV: 102 fL — ABNORMAL HIGH (ref 79–97)
Monocytes Absolute: 0.5 10*3/uL (ref 0.1–0.9)
Monocytes: 14 %
Neutrophils Absolute: 1.6 10*3/uL (ref 1.4–7.0)
Neutrophils: 43 %
Platelets: 234 10*3/uL (ref 150–450)
RBC: 3.7 x10E6/uL — ABNORMAL LOW (ref 4.14–5.80)
RDW: 12.6 % (ref 11.6–15.4)
WBC: 3.6 10*3/uL (ref 3.4–10.8)

## 2023-03-01 LAB — CMP14+EGFR
ALT: 52 IU/L — ABNORMAL HIGH (ref 0–44)
AST: 89 IU/L — ABNORMAL HIGH (ref 0–40)
Albumin/Globulin Ratio: 1.2 (ref 1.2–2.2)
Albumin: 4.5 g/dL (ref 4.1–5.1)
Alkaline Phosphatase: 134 IU/L — ABNORMAL HIGH (ref 44–121)
BUN/Creatinine Ratio: 7 — ABNORMAL LOW (ref 9–20)
BUN: 6 mg/dL (ref 6–24)
Bilirubin Total: 0.4 mg/dL (ref 0.0–1.2)
CO2: 20 mmol/L (ref 20–29)
Calcium: 9.9 mg/dL (ref 8.7–10.2)
Chloride: 105 mmol/L (ref 96–106)
Creatinine, Ser: 0.87 mg/dL (ref 0.76–1.27)
Globulin, Total: 3.8 g/dL (ref 1.5–4.5)
Glucose: 58 mg/dL — ABNORMAL LOW (ref 70–99)
Potassium: 4.5 mmol/L (ref 3.5–5.2)
Sodium: 144 mmol/L (ref 134–144)
Total Protein: 8.3 g/dL (ref 6.0–8.5)
eGFR: 107 mL/min/{1.73_m2} (ref >59)

## 2023-03-01 LAB — LIPID PANEL
Chol/HDL Ratio: 2.1 ratio (ref 0.0–5.0)
Cholesterol, Total: 193 mg/dL (ref 100–199)
HDL: 92 mg/dL (ref >39)
LDL Chol Calc (NIH): 84 mg/dL (ref 0–99)
Triglycerides: 98 mg/dL (ref 0–149)
VLDL Cholesterol Cal: 17 mg/dL (ref 5–40)

## 2023-03-02 ENCOUNTER — Other Ambulatory Visit: Payer: Self-pay

## 2023-03-03 ENCOUNTER — Other Ambulatory Visit: Payer: Self-pay

## 2023-03-22 ENCOUNTER — Ambulatory Visit: Payer: Medicaid Other | Attending: Family Medicine

## 2023-03-22 DIAGNOSIS — R748 Abnormal levels of other serum enzymes: Secondary | ICD-10-CM | POA: Diagnosis not present

## 2023-03-23 LAB — HEPATIC FUNCTION PANEL
ALT: 68 IU/L — ABNORMAL HIGH (ref 0–44)
AST: 97 IU/L — ABNORMAL HIGH (ref 0–40)
Albumin: 4.4 g/dL (ref 4.1–5.1)
Alkaline Phosphatase: 125 IU/L — ABNORMAL HIGH (ref 44–121)
Bilirubin Total: 0.2 mg/dL (ref 0.0–1.2)
Bilirubin, Direct: <0.1 mg/dL (ref 0.00–0.40)
Total Protein: 8.1 g/dL (ref 6.0–8.5)

## 2023-03-23 LAB — CMP14+EGFR
ALT: 73 IU/L — ABNORMAL HIGH (ref 0–44)
AST: 100 IU/L — ABNORMAL HIGH (ref 0–40)
Albumin/Globulin Ratio: 1.1 — ABNORMAL LOW (ref 1.2–2.2)
Albumin: 4.2 g/dL (ref 4.1–5.1)
Alkaline Phosphatase: 126 IU/L — ABNORMAL HIGH (ref 44–121)
BUN/Creatinine Ratio: 8 — ABNORMAL LOW (ref 9–20)
BUN: 8 mg/dL (ref 6–24)
Bilirubin Total: 0.2 mg/dL (ref 0.0–1.2)
CO2: 19 mmol/L — ABNORMAL LOW (ref 20–29)
Calcium: 9.8 mg/dL (ref 8.7–10.2)
Chloride: 102 mmol/L (ref 96–106)
Creatinine, Ser: 0.95 mg/dL (ref 0.76–1.27)
Globulin, Total: 3.9 g/dL (ref 1.5–4.5)
Glucose: 231 mg/dL — ABNORMAL HIGH (ref 70–99)
Potassium: 4.4 mmol/L (ref 3.5–5.2)
Sodium: 136 mmol/L (ref 134–144)
Total Protein: 8.1 g/dL (ref 6.0–8.5)
eGFR: 99 mL/min/{1.73_m2} (ref >59)

## 2023-04-06 ENCOUNTER — Telehealth: Payer: Self-pay

## 2023-04-06 NOTE — Telephone Encounter (Signed)
Patient identified by name and date of birth.  Given contact information for GI.

## 2023-04-06 NOTE — Telephone Encounter (Signed)
Copied from CRM [id number]. Topic: General - Other >> Apr 06, 2023 11:07 AM Everette C wrote: Reason for CRM: The patient would like to be contacted by a member of staff to follow up on previous discussions related to a colonoscopy / GI referral   Please contact the patient further when possible

## 2023-04-07 ENCOUNTER — Encounter: Payer: Self-pay | Admitting: Physician Assistant

## 2023-04-11 ENCOUNTER — Ambulatory Visit: Payer: Medicaid Other | Admitting: Pharmacist

## 2023-05-15 ENCOUNTER — Other Ambulatory Visit (HOSPITAL_COMMUNITY): Payer: Self-pay

## 2023-05-16 ENCOUNTER — Other Ambulatory Visit: Payer: Self-pay

## 2023-05-16 ENCOUNTER — Other Ambulatory Visit (HOSPITAL_COMMUNITY): Payer: Self-pay

## 2023-05-25 ENCOUNTER — Other Ambulatory Visit: Payer: Self-pay

## 2023-05-25 ENCOUNTER — Emergency Department (HOSPITAL_COMMUNITY)
Admission: EM | Admit: 2023-05-25 | Discharge: 2023-05-26 | Disposition: A | Discharge status: Home / Self Care | Payer: Medicaid Other | Attending: Emergency Medicine | Admitting: Emergency Medicine

## 2023-05-25 ENCOUNTER — Ambulatory Visit (HOSPITAL_COMMUNITY)
Admission: EM | Admit: 2023-05-25 | Discharge: 2023-05-25 | Disposition: A | Discharge status: Home / Self Care | Payer: Medicaid Other | Attending: Internal Medicine | Admitting: Internal Medicine

## 2023-05-25 ENCOUNTER — Emergency Department (HOSPITAL_COMMUNITY): Payer: Medicaid Other

## 2023-05-25 ENCOUNTER — Encounter (HOSPITAL_COMMUNITY): Payer: Self-pay | Admitting: *Deleted

## 2023-05-25 DIAGNOSIS — K5289 Other specified noninfective gastroenteritis and colitis: Secondary | ICD-10-CM

## 2023-05-25 DIAGNOSIS — D72819 Decreased white blood cell count, unspecified: Secondary | ICD-10-CM | POA: Diagnosis not present

## 2023-05-25 DIAGNOSIS — R1013 Epigastric pain: Secondary | ICD-10-CM | POA: Diagnosis not present

## 2023-05-25 DIAGNOSIS — R748 Abnormal levels of other serum enzymes: Secondary | ICD-10-CM

## 2023-05-25 DIAGNOSIS — Z79899 Other long term (current) drug therapy: Secondary | ICD-10-CM | POA: Insufficient documentation

## 2023-05-25 DIAGNOSIS — R1011 Right upper quadrant pain: Secondary | ICD-10-CM | POA: Insufficient documentation

## 2023-05-25 DIAGNOSIS — R112 Nausea with vomiting, unspecified: Secondary | ICD-10-CM | POA: Insufficient documentation

## 2023-05-25 DIAGNOSIS — K760 Fatty (change of) liver, not elsewhere classified: Secondary | ICD-10-CM | POA: Diagnosis not present

## 2023-05-25 DIAGNOSIS — Z7984 Long term (current) use of oral hypoglycemic drugs: Secondary | ICD-10-CM | POA: Insufficient documentation

## 2023-05-25 DIAGNOSIS — E1065 Type 1 diabetes mellitus with hyperglycemia: Secondary | ICD-10-CM

## 2023-05-25 DIAGNOSIS — R7401 Elevation of levels of liver transaminase levels: Secondary | ICD-10-CM | POA: Insufficient documentation

## 2023-05-25 DIAGNOSIS — R197 Diarrhea, unspecified: Secondary | ICD-10-CM | POA: Insufficient documentation

## 2023-05-25 DIAGNOSIS — E119 Type 2 diabetes mellitus without complications: Secondary | ICD-10-CM | POA: Diagnosis not present

## 2023-05-25 DIAGNOSIS — Z794 Long term (current) use of insulin: Secondary | ICD-10-CM | POA: Diagnosis not present

## 2023-05-25 DIAGNOSIS — I10 Essential (primary) hypertension: Secondary | ICD-10-CM | POA: Insufficient documentation

## 2023-05-25 LAB — URINALYSIS, ROUTINE W REFLEX MICROSCOPIC
Bilirubin Urine: NEGATIVE
Glucose, UA: >=500 mg/dL — AB
Ketones, ur: 20 mg/dL — AB
Leukocytes,Ua: NEGATIVE
Nitrite: NEGATIVE
Protein, ur: 100 mg/dL — AB
Specific Gravity, Urine: 1.01 (ref 1.005–1.030)
pH: 7 (ref 5.0–8.0)

## 2023-05-25 LAB — CBC
HCT: 40 % (ref 39.0–52.0)
Hemoglobin: 13.6 g/dL (ref 13.0–17.0)
MCH: 35.3 pg — ABNORMAL HIGH (ref 26.0–34.0)
MCHC: 34 g/dL (ref 30.0–36.0)
MCV: 103.9 fL — ABNORMAL HIGH (ref 80.0–100.0)
Platelets: 172 10*3/uL (ref 150–400)
RBC: 3.85 MIL/uL — ABNORMAL LOW (ref 4.22–5.81)
RDW: 13.3 % (ref 11.5–15.5)
WBC: 2.8 10*3/uL — ABNORMAL LOW (ref 4.0–10.5)
nRBC: 0 % (ref 0.0–0.2)

## 2023-05-25 LAB — I-STAT VENOUS BLOOD GAS, ED
Acid-Base Excess: 2 mmol/L (ref 0.0–2.0)
Bicarbonate: 26.6 mmol/L (ref 20.0–28.0)
Calcium, Ion: 1.19 mmol/L (ref 1.15–1.40)
HCT: 39 % (ref 39.0–52.0)
Hemoglobin: 13.3 g/dL (ref 13.0–17.0)
O2 Saturation: 99 %
Potassium: 4 mmol/L (ref 3.5–5.1)
Sodium: 135 mmol/L (ref 135–145)
TCO2: 28 mmol/L (ref 22–32)
pCO2, Ven: 40 mmHg — ABNORMAL LOW (ref 44–60)
pH, Ven: 7.43 (ref 7.25–7.43)
pO2, Ven: 126 mmHg — ABNORMAL HIGH (ref 32–45)

## 2023-05-25 LAB — COMPREHENSIVE METABOLIC PANEL
ALT: 78 U/L — ABNORMAL HIGH (ref 0–44)
AST: 140 U/L — ABNORMAL HIGH (ref 15–41)
Albumin: 3.8 g/dL (ref 3.5–5.0)
Alkaline Phosphatase: 106 U/L (ref 38–126)
Anion gap: 11 (ref 5–15)
BUN: 5 mg/dL — ABNORMAL LOW (ref 6–20)
CO2: 24 mmol/L (ref 22–32)
Calcium: 9.6 mg/dL (ref 8.9–10.3)
Chloride: 98 mmol/L (ref 98–111)
Creatinine, Ser: 0.9 mg/dL (ref 0.61–1.24)
GFR, Estimated: >60 mL/min (ref >60)
Glucose, Bld: 215 mg/dL — ABNORMAL HIGH (ref 70–99)
Potassium: 3.9 mmol/L (ref 3.5–5.1)
Sodium: 133 mmol/L — ABNORMAL LOW (ref 135–145)
Total Bilirubin: 1.3 mg/dL — ABNORMAL HIGH (ref 0.3–1.2)
Total Protein: 8.7 g/dL — ABNORMAL HIGH (ref 6.5–8.1)

## 2023-05-25 LAB — POCT FASTING CBG KUC MANUAL ENTRY: POCT Glucose (KUC): 196 mg/dL — AB (ref 70–99)

## 2023-05-25 LAB — LIPASE, BLOOD: Lipase: 20 U/L (ref 11–51)

## 2023-05-25 MED ORDER — MORPHINE SULFATE (PF) 4 MG/ML IV SOLN
4.0000 mg | Freq: Once | INTRAVENOUS | Status: AC
  Administered 2023-05-25: 4 mg via INTRAVENOUS
  Filled 2023-05-25: qty 1

## 2023-05-25 MED ORDER — METOCLOPRAMIDE HCL 5 MG/ML IJ SOLN
10.0000 mg | Freq: Once | INTRAMUSCULAR | Status: AC
  Administered 2023-05-25: 10 mg via INTRAVENOUS
  Filled 2023-05-25: qty 2

## 2023-05-25 MED ORDER — METOCLOPRAMIDE HCL 5 MG/ML IJ SOLN: 10.0000 mg | Freq: Once | INTRAMUSCULAR | Status: DC

## 2023-05-25 MED ORDER — LACTATED RINGERS IV BOLUS
1500.0000 mL | Freq: Once | INTRAVENOUS | Status: AC
  Administered 2023-05-25: 1000 mL via INTRAVENOUS

## 2023-05-25 MED ORDER — PROMETHAZINE HCL 25 MG/ML IJ SOLN: 25.0000 mg | Freq: Four times a day (QID) | INTRAMUSCULAR | Status: DC | PRN

## 2023-05-25 MED ORDER — METOCLOPRAMIDE HCL 5 MG/ML IJ SOLN
INTRAMUSCULAR | Status: AC
  Filled 2023-05-25: qty 2
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
## 2023-05-25 NOTE — ED Triage Notes (Signed)
Pt states she has been vomiting and having diarrhea since this morning. States decreased appetite x 3 days. Wife states he took zofran at 12 noon and 2pm today. Pt states he hasn't taken his meds today due to vomiting. He states his blood sugar was low last night at work.

## 2023-05-25 NOTE — Discharge Instructions (Addendum)
Please go to the ER right now to have lab work done which we can't do here to get the results before we close.  We do not carry Phenergan, and since you refused Reglan there is nothing else we can offer you to help with the nausea.  Your glucose is 196 right now

## 2023-05-25 NOTE — ED Triage Notes (Signed)
Patient with hx of pancreatitis here for eval of abdominal pain, n/v/d starting at 0600 today. Endorses ETOH yesterday.
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
## 2023-05-25 NOTE — ED Notes (Signed)
Went to attempt orthostatic vitals on pt and he sttaes when he lays back he is dizzy and can't complete. Provider aware.

## 2023-05-25 NOTE — ED Notes (Signed)
Patient is being discharged from the Urgent Care and sent to the Emergency Department via POV . Per Garey Ham, patient is in need of higher level of care due to vomting. Patient is aware and verbalizes understanding of plan of care.  Vitals:   05/25/23 1744  BP: (!) 174/100  Pulse: 66  Resp: 18  Temp: 98.2 F (36.8 C)

## 2023-05-25 NOTE — ED Notes (Signed)
Pt offered Reglan IM, his wife quickly declined. Provider aware.

## 2023-05-26 ENCOUNTER — Other Ambulatory Visit: Payer: Self-pay

## 2023-05-26 MED ORDER — LOPERAMIDE HCL 2 MG PO CAPS
2.0000 mg | ORAL_CAPSULE | Freq: Four times a day (QID) | ORAL | 0 refills | Status: DC | PRN
  Filled 2023-05-26: qty 12, 3d supply, fill #0

## 2023-05-26 MED ORDER — ONDANSETRON 4 MG PO TBDP
ORAL_TABLET | ORAL | 0 refills | Status: DC
  Filled 2023-05-26: qty 10, 2d supply, fill #0

## 2023-05-26 NOTE — ED Provider Notes (Signed)
Patient signed out to me by Dr. Anitra Lauth.  Patient seen with abdominal pain, nausea, vomiting, diarrhea.  Workup has been reassuring.  Patient with multiple episodes of emesis and diarrhea, receiving IV fluids.  Upon recheck he is not experiencing any abdominal pain.  He has tolerated oral intake without any further vomiting.  Will be appropriate for discharge with symptomatic treatment.   Gilda Crease, MD 05/26/23 [phone]

## 2023-05-31 ENCOUNTER — Encounter: Payer: Self-pay | Admitting: Nurse Practitioner

## 2023-05-31 ENCOUNTER — Other Ambulatory Visit: Payer: Self-pay

## 2023-05-31 ENCOUNTER — Ambulatory Visit: Payer: Medicaid Other | Attending: Nurse Practitioner | Admitting: Nurse Practitioner

## 2023-05-31 VITALS — BP 140/80 | HR 73 | Ht 72.0 in | Wt 213.0 lb

## 2023-05-31 DIAGNOSIS — E1165 Type 2 diabetes mellitus with hyperglycemia: Secondary | ICD-10-CM

## 2023-05-31 DIAGNOSIS — Z7984 Long term (current) use of oral hypoglycemic drugs: Secondary | ICD-10-CM

## 2023-05-31 DIAGNOSIS — F320 Major depressive disorder, single episode, mild: Secondary | ICD-10-CM | POA: Diagnosis not present

## 2023-05-31 DIAGNOSIS — I10 Essential (primary) hypertension: Secondary | ICD-10-CM | POA: Diagnosis not present

## 2023-05-31 DIAGNOSIS — E1142 Type 2 diabetes mellitus with diabetic polyneuropathy: Secondary | ICD-10-CM | POA: Diagnosis not present

## 2023-05-31 DIAGNOSIS — Z89512 Acquired absence of left leg below knee: Secondary | ICD-10-CM | POA: Diagnosis not present

## 2023-05-31 DIAGNOSIS — Z7985 Long-term (current) use of injectable non-insulin antidiabetic drugs: Secondary | ICD-10-CM | POA: Diagnosis not present

## 2023-05-31 DIAGNOSIS — Z794 Long term (current) use of insulin: Secondary | ICD-10-CM | POA: Diagnosis not present

## 2023-05-31 LAB — POCT GLYCOSYLATED HEMOGLOBIN (HGB A1C): Hemoglobin A1C: 9.1 % — AB (ref 4.0–5.6)

## 2023-05-31 MED ORDER — ACCU-CHEK GUIDE VI STRP
ORAL_STRIP | 12 refills | Status: DC
Start: 2023-05-31 — End: 2024-05-01
  Filled 2023-05-31: qty 100, 50d supply, fill #0

## 2023-05-31 MED ORDER — TRULICITY 1.5 MG/0.5ML ~~LOC~~ SOAJ
1.5000 mg | SUBCUTANEOUS | 1 refills | Status: DC
Start: 2023-05-31 — End: 2023-08-01
  Filled 2023-05-31: qty 2, 28d supply, fill #0
  Filled 2023-06-30: qty 2, 28d supply, fill #1
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
## 2023-06-02 ENCOUNTER — Other Ambulatory Visit: Payer: Self-pay

## 2023-06-19 ENCOUNTER — Encounter: Payer: Self-pay | Admitting: Physician Assistant

## 2023-06-19 ENCOUNTER — Other Ambulatory Visit (INDEPENDENT_AMBULATORY_CARE_PROVIDER_SITE_OTHER): Payer: Medicaid Other

## 2023-06-19 ENCOUNTER — Ambulatory Visit (INDEPENDENT_AMBULATORY_CARE_PROVIDER_SITE_OTHER): Payer: Medicaid Other | Admitting: Physician Assistant

## 2023-06-19 ENCOUNTER — Other Ambulatory Visit: Payer: Self-pay

## 2023-06-19 DIAGNOSIS — R7989 Other specified abnormal findings of blood chemistry: Secondary | ICD-10-CM

## 2023-06-19 DIAGNOSIS — K760 Fatty (change of) liver, not elsewhere classified: Secondary | ICD-10-CM | POA: Diagnosis not present

## 2023-06-19 DIAGNOSIS — Z1211 Encounter for screening for malignant neoplasm of colon: Secondary | ICD-10-CM

## 2023-06-19 DIAGNOSIS — R194 Change in bowel habit: Secondary | ICD-10-CM | POA: Diagnosis not present

## 2023-06-19 LAB — FERRITIN: Ferritin: 100.2 ng/mL (ref 22.0–322.0)

## 2023-06-19 LAB — HEPATIC FUNCTION PANEL
ALT: 36 U/L (ref 0–53)
AST: 48 U/L — ABNORMAL HIGH (ref 0–37)
Albumin: 4.1 g/dL (ref 3.5–5.2)
Alkaline Phosphatase: 112 U/L (ref 39–117)
Bilirubin, Direct: 0.1 mg/dL (ref 0.0–0.3)
Total Bilirubin: 0.6 mg/dL (ref 0.2–1.2)
Total Protein: 8.3 g/dL (ref 6.0–8.3)

## 2023-06-19 MED ORDER — NA SULFATE-K SULFATE-MG SULF 17.5-3.13-1.6 GM/177ML PO SOLN
1.0000 | ORAL | 0 refills | Status: DC
  Filled 2023-06-19 – 2023-06-26 (×2): qty 354, 2d supply, fill #0
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
## 2023-06-19 NOTE — Patient Instructions (Addendum)
_______________________________________________________  If your blood pressure at your visit was 140/90 or greater, please contact your primary care physician to follow up on this.  If you are age 48 or younger, your body mass index should be between 19-25. Your Body mass index is 28.89 kg/m. If this is out of the aformentioned range listed, please consider follow up with your Primary Care Provider.  ________________________________________________________  The [facility] providers would like to encourage you to use [facility] to communicate with providers for non-urgent requests or questions.  Due to long hold times on the telephone, sending your provider a message by [facility] may be a faster and more efficient way to get a response.  Please allow 48 business hours for a response.  Please remember that this is for non-urgent requests.  _______________________________________________________  Your provider has requested that you go to the basement level for lab work before leaving today. Press "B" on the elevator. The lab is located at the first door on the left as you exit the elevator.  You have been scheduled for a colonoscopy. Please follow written instructions given to you at your visit today.   Please pick up your prep supplies at the pharmacy within the next 1-3 days.  If you use inhalers (even only as needed), please bring them with you on the day of your procedure.  DO NOT TAKE 7 DAYS PRIOR TO TEST- Trulicity (dulaglutide) Ozempic, Wegovy (semaglutide) Mounjaro (tirzepatide) Bydureon Bcise (exanatide extended release)  DO NOT TAKE 1 DAY PRIOR TO YOUR TEST Rybelsus (semaglutide) Adlyxin (lixisenatide) Victoza (liraglutide) Byetta (exanatide) ___________________________________________________________________________  Due to recent changes in healthcare laws, you may see the results of your imaging and laboratory studies on MyChart before your provider has had a chance to review  them.  We understand that in some cases there may be results that are confusing or concerning to you. Not all laboratory results come back in the same time frame and the provider may be waiting for multiple results in order to interpret others.  Please give [place] 48 hours in order for your provider to thoroughly review all the results before contacting the office for clarification of your results.   Thank you for entrusting me with your care and choosing [facility].  Amy Esterwood, PA-C

## 2023-06-20 LAB — HEPATITIS A ANTIBODY, TOTAL: Hepatitis A AB,Total: NONREACTIVE

## 2023-06-20 LAB — ANA: Anti Nuclear Antibody (ANA): NEGATIVE

## 2023-06-20 LAB — HEPATITIS B SURFACE ANTIBODY,QUALITATIVE: Hep B S Ab: NONREACTIVE

## 2023-06-20 LAB — HEPATITIS C ANTIBODY: Hepatitis C Ab: NONREACTIVE

## 2023-06-20 LAB — HEPATITIS B SURFACE ANTIGEN: Hepatitis B Surface Ag: NONREACTIVE

## 2023-06-23 ENCOUNTER — Other Ambulatory Visit: Payer: Self-pay

## 2023-06-26 ENCOUNTER — Other Ambulatory Visit: Payer: Self-pay

## 2023-06-30 ENCOUNTER — Other Ambulatory Visit: Payer: Self-pay

## 2023-06-30 ENCOUNTER — Other Ambulatory Visit (HOSPITAL_COMMUNITY): Payer: Self-pay

## 2023-07-03 ENCOUNTER — Other Ambulatory Visit: Payer: Self-pay

## 2023-07-06 ENCOUNTER — Ambulatory Visit: Payer: Medicaid Other | Admitting: Pharmacist

## 2023-07-09 ENCOUNTER — Encounter (HOSPITAL_COMMUNITY): Payer: Self-pay

## 2023-07-09 ENCOUNTER — Ambulatory Visit (HOSPITAL_COMMUNITY)
Admission: EM | Admit: 2023-07-09 | Discharge: 2023-07-09 | Disposition: A | Discharge status: Home / Self Care | Payer: Medicaid Other | Attending: Physician Assistant | Admitting: Physician Assistant

## 2023-07-09 DIAGNOSIS — L089 Local infection of the skin and subcutaneous tissue, unspecified: Secondary | ICD-10-CM

## 2023-07-09 DIAGNOSIS — T8744 Infection of amputation stump, left lower extremity: Secondary | ICD-10-CM

## 2023-07-09 MED ORDER — SULFAMETHOXAZOLE-TRIMETHOPRIM 800-160 MG PO TABS
1.0000 | ORAL_TABLET | Freq: Two times a day (BID) | ORAL | 0 refills | Status: AC
  Filled 2023-07-09: qty 14, 7d supply, fill #0

## 2023-07-09 MED ORDER — CEPHALEXIN 500 MG PO CAPS
500.0000 mg | ORAL_CAPSULE | Freq: Three times a day (TID) | ORAL | 0 refills | Status: DC
  Filled 2023-07-09: qty 21, 7d supply, fill #0

## 2023-07-09 NOTE — Discharge Instructions (Signed)
We are going to cover for infection with 2 antibiotics.  Take cephalexin 3 times a day for 1 week and Bactrim DS twice daily for 1 week.  Follow-up with wound care soon as possible.  Call them to schedule an appointment.  I would like someone to reevaluate you within a weeks if you are unable to see your primary care for wound care in this timeframe please return here.  If anything worsens and you develop fever, chills, nausea, vomiting, increasing pain, additional drainage, spread of redness you need to go to the emergency room.

## 2023-07-09 NOTE — ED Triage Notes (Signed)
Pt states he has skin infection under his prosthetic leg X 1 week. Pt states this has been a recurring issue.
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
## 2023-07-10 ENCOUNTER — Encounter: Payer: Self-pay | Admitting: Orthopedic Surgery

## 2023-07-10 ENCOUNTER — Ambulatory Visit: Payer: Medicaid Other | Admitting: Orthopedic Surgery

## 2023-07-10 ENCOUNTER — Other Ambulatory Visit: Payer: Self-pay

## 2023-07-10 DIAGNOSIS — Z89512 Acquired absence of left leg below knee: Secondary | ICD-10-CM | POA: Diagnosis not present

## 2023-07-10 DIAGNOSIS — L97921 Non-pressure chronic ulcer of unspecified part of left lower leg limited to breakdown of skin: Secondary | ICD-10-CM
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
## 2023-07-18 ENCOUNTER — Encounter: Payer: Medicaid Other | Admitting: Gastroenterology

## 2023-07-20 ENCOUNTER — Encounter (HOSPITAL_COMMUNITY): Payer: Self-pay

## 2023-07-20 ENCOUNTER — Emergency Department (HOSPITAL_COMMUNITY)
Admission: EM | Admit: 2023-07-20 | Discharge: 2023-07-20 | Disposition: A | Discharge status: Home / Self Care | Payer: Medicaid Other | Attending: Emergency Medicine | Admitting: Emergency Medicine

## 2023-07-20 DIAGNOSIS — Z7984 Long term (current) use of oral hypoglycemic drugs: Secondary | ICD-10-CM | POA: Diagnosis not present

## 2023-07-20 DIAGNOSIS — I10 Essential (primary) hypertension: Secondary | ICD-10-CM | POA: Insufficient documentation

## 2023-07-20 DIAGNOSIS — Z79899 Other long term (current) drug therapy: Secondary | ICD-10-CM | POA: Diagnosis not present

## 2023-07-20 DIAGNOSIS — E119 Type 2 diabetes mellitus without complications: Secondary | ICD-10-CM | POA: Diagnosis not present

## 2023-07-20 DIAGNOSIS — R112 Nausea with vomiting, unspecified: Secondary | ICD-10-CM | POA: Insufficient documentation

## 2023-07-20 DIAGNOSIS — Z794 Long term (current) use of insulin: Secondary | ICD-10-CM | POA: Insufficient documentation

## 2023-07-20 LAB — CBC WITH DIFFERENTIAL/PLATELET
Abs Immature Granulocytes: 0.01 10*3/uL (ref 0.00–0.07)
Basophils Absolute: 0 10*3/uL (ref 0.0–0.1)
Basophils Relative: 1 %
Eosinophils Absolute: 0 10*3/uL (ref 0.0–0.5)
Eosinophils Relative: 1 %
HCT: 42.8 % (ref 39.0–52.0)
Hemoglobin: 14.5 g/dL (ref 13.0–17.0)
Immature Granulocytes: 0 %
Lymphocytes Relative: 23 %
Lymphs Abs: 0.9 10*3/uL (ref 0.7–4.0)
MCH: 34.4 pg — ABNORMAL HIGH (ref 26.0–34.0)
MCHC: 33.9 g/dL (ref 30.0–36.0)
MCV: 101.4 fL — ABNORMAL HIGH (ref 80.0–100.0)
Monocytes Absolute: 0.4 10*3/uL (ref 0.1–1.0)
Monocytes Relative: 11 %
Neutro Abs: 2.5 10*3/uL (ref 1.7–7.7)
Neutrophils Relative %: 64 %
Platelets: 240 10*3/uL (ref 150–400)
RBC: 4.22 MIL/uL (ref 4.22–5.81)
RDW: 12.8 % (ref 11.5–15.5)
WBC: 3.9 10*3/uL — ABNORMAL LOW (ref 4.0–10.5)
nRBC: 0 % (ref 0.0–0.2)

## 2023-07-20 LAB — COMPREHENSIVE METABOLIC PANEL
ALT: 32 U/L (ref 0–44)
AST: 31 U/L (ref 15–41)
Albumin: 3.7 g/dL (ref 3.5–5.0)
Alkaline Phosphatase: 103 U/L (ref 38–126)
Anion gap: 15 (ref 5–15)
BUN: 10 mg/dL (ref 6–20)
CO2: 22 mmol/L (ref 22–32)
Calcium: 9.5 mg/dL (ref 8.9–10.3)
Chloride: 94 mmol/L — ABNORMAL LOW (ref 98–111)
Creatinine, Ser: 1.08 mg/dL (ref 0.61–1.24)
GFR, Estimated: >60 mL/min (ref >60)
Glucose, Bld: 326 mg/dL — ABNORMAL HIGH (ref 70–99)
Potassium: 4.2 mmol/L (ref 3.5–5.1)
Sodium: 131 mmol/L — ABNORMAL LOW (ref 135–145)
Total Bilirubin: 1.1 mg/dL (ref 0.3–1.2)
Total Protein: 9.3 g/dL — ABNORMAL HIGH (ref 6.5–8.1)

## 2023-07-20 LAB — URINALYSIS, ROUTINE W REFLEX MICROSCOPIC
Bilirubin Urine: NEGATIVE
Glucose, UA: >=500 mg/dL — AB
Ketones, ur: 80 mg/dL — AB
Leukocytes,Ua: NEGATIVE
Nitrite: NEGATIVE
Protein, ur: 30 mg/dL — AB
Specific Gravity, Urine: 1.026 (ref 1.005–1.030)
pH: 6 (ref 5.0–8.0)

## 2023-07-20 LAB — LIPASE, BLOOD: Lipase: 18 U/L (ref 11–51)

## 2023-07-20 LAB — CBG MONITORING, ED: Glucose-Capillary: 287 mg/dL — ABNORMAL HIGH (ref 70–99)

## 2023-07-20 MED ORDER — ONDANSETRON HCL 4 MG PO TABS: 4.0000 mg | ORAL_TABLET | Freq: Four times a day (QID) | ORAL | 0 refills | Status: DC

## 2023-07-20 MED ORDER — METOCLOPRAMIDE HCL 5 MG/ML IJ SOLN
10.0000 mg | Freq: Once | INTRAMUSCULAR | Status: AC
  Administered 2023-07-20: 10 mg via INTRAVENOUS
  Filled 2023-07-20: qty 2

## 2023-07-20 MED ORDER — INSULIN ASPART 100 UNIT/ML IJ SOLN
6.0000 [IU] | Freq: Once | INTRAMUSCULAR | Status: AC
  Administered 2023-07-20: 6 [IU] via SUBCUTANEOUS

## 2023-07-20 MED ORDER — LIDOCAINE VISCOUS HCL 2 % MT SOLN
15.0000 mL | Freq: Once | OROMUCOSAL | Status: AC
  Administered 2023-07-20: 15 mL via ORAL
  Filled 2023-07-20: qty 15

## 2023-07-20 MED ORDER — LACTATED RINGERS IV BOLUS
1000.0000 mL | Freq: Once | INTRAVENOUS | Status: AC
  Administered 2023-07-20: 1000 mL via INTRAVENOUS

## 2023-07-20 MED ORDER — ALUM & MAG HYDROXIDE-SIMETH 200-200-20 MG/5ML PO SUSP
30.0000 mL | Freq: Once | ORAL | Status: AC
  Administered 2023-07-20: 30 mL via ORAL
  Filled 2023-07-20: qty 30

## 2023-07-20 MED ORDER — FENTANYL CITRATE PF 50 MCG/ML IJ SOSY
50.0000 ug | PREFILLED_SYRINGE | Freq: Once | INTRAMUSCULAR | Status: AC
  Administered 2023-07-20: 50 ug via INTRAVENOUS
  Filled 2023-07-20: qty 1

## 2023-07-20 NOTE — ED Provider Notes (Signed)
Accepted handoff at shift change from [facility]. Please see prior provider note for more detail.   Briefly: Patient is 47 y.o.   DDX: concern for NV today for the last few days, patient daily ETOH abuse. Similar symptoms back in June. Reglan, fluids. Needing to tolerated PO.   Plan: Tolerating PO, blood sugar improved. Stable for discharged at this time. Discharged with zofran.      RISR  EDTHIS    [place] 07/20/23 1646    Gwyneth Sprout, MD 07/20/23 2252

## 2023-07-20 NOTE — ED Notes (Signed)
Pt has received a [place] sandwich and diet ginger ale for PO/fluid challenge.

## 2023-07-20 NOTE — ED Triage Notes (Addendum)
Pt is coming in for nausea and vomiting x 2days, had similar symptoms last month that he reports that he was seen for but cannot express a reason of why it happened. Does not smoke marijuana, but admits to etoh use. No chest pain, no shortness of breath at this time, he does endorse some dzziness

## 2023-07-20 NOTE — ED Provider Notes (Signed)
[place] EMERGENCY DEPARTMENT AT [facility] Provider Note   CSN: [id number] Arrival date & time: 07/20/23  1238     History  Chief Complaint  Patient presents with   Emesis    [name] is a 47 y.o. male with past medical history significant for diabetes, hypertension, pancreatitis, alcohol abuse, hyperlipidemia, anxiety presents to the ED complaining of nausea and vomiting for the past 2 days.  Patient reports he had similar symptoms in June and he was evaluated and treated in the ED.  He reports daily alcohol consumption, but has not used any in the last 2 days.  He has not been able to keep down solids or liquids.  Denies fever, diarrhea, Donnell distention, dizziness, lightheadedness, syncope.       Home Medications Prior to Admission medications   Medication Sig Start Date End Date Taking? Authorizing Provider  Accu-Chek Softclix Lancets lancets Use as instructed. Check blood glucose level by fingerstick twice per day. 02/28/23   Claiborne Rigg, NP  amLODipine (NORVASC) 10 MG tablet Take 1 tablet (10 mg total) by mouth daily. 02/28/23   Claiborne Rigg, NP  atorvastatin (LIPITOR) 20 MG tablet Take 1 tablet (20 mg total) by mouth daily. 02/28/23   Claiborne Rigg, NP  cephALEXin (KEFLEX) 500 MG capsule Take 1 capsule (500 mg total) by mouth 3 (three) times daily. 07/09/23   Raspet, Noberto Retort, PA-C  dapagliflozin propanediol (FARXIGA) 10 MG TABS tablet Take 1 tablet (10 mg total) by mouth daily before breakfast. 02/28/23   Claiborne Rigg, NP  Dulaglutide (TRULICITY) 1.5 MG/0.5ML SOPN Inject 1.5 mg into the skin once a week. 05/31/23   Claiborne Rigg, NP  glucose blood (ACCU-CHEK GUIDE) test strip Use as instructed. Check blood glucose by fingerstick twice per day. 05/31/23   Claiborne Rigg, NP  insulin isophane & regular human KwikPen (HUMULIN 70/30 KWIKPEN) (70-30) 100 UNIT/ML KwikPen Inject 15 Units into the skin 2 (two) times daily. 02/28/23   Claiborne Rigg, NP   Insulin Pen Needle (B-D UF III MINI PEN NEEDLES) 31G X 5 MM MISC Use as instructed. Inject into the skin 5 times daily 02/28/23   Claiborne Rigg, NP  loperamide (IMODIUM) 2 MG capsule Take 1 capsule (2 mg total) by mouth 4 (four) times daily as needed for diarrhea or loose stools. 05/26/23   Gilda Crease, MD  losartan (COZAAR) 100 MG tablet Take 1 tablet (100 mg total) by mouth daily. 02/28/23   Claiborne Rigg, NP  Na Sulfate-K Sulfate-Mg Sulf (SUPREP BOWEL PREP KIT) 17.5-3.13-1.6 GM/177ML SOLN Take 1 kit by mouth as directed. 06/19/23   Esterwood, Amy S, PA-C  ondansetron (ZOFRAN-ODT) 4 MG disintegrating tablet Take 1 tablet by mouth every 4 hours as needed for nausea/vomit 05/26/23   Pollina, Canary Brim, MD      Allergies    Patient has no known allergies.    Review of Systems   Review of Systems  Constitutional:  Negative for chills and fever.  Gastrointestinal:  Positive for abdominal pain, nausea and vomiting. Negative for abdominal distention and diarrhea.  Neurological:  Negative for dizziness, syncope and light-headedness.    Physical Exam Updated Vital Signs BP (!) 150/101   Pulse 89   Temp 97.6 F (36.4 C) (Oral)   Resp 18   SpO2 100%  Physical Exam Vitals and nursing note reviewed.  Constitutional:      General: He is not in acute distress.  Appearance: Normal appearance. He is not ill-appearing or diaphoretic.  Cardiovascular:     Rate and Rhythm: Normal rate and regular rhythm.  Pulmonary:     Effort: Pulmonary effort is normal.  Abdominal:     General: Abdomen is flat.     Palpations: Abdomen is soft.     Tenderness: There is abdominal tenderness (mild) in the right upper quadrant, epigastric area and left upper quadrant. There is no guarding or rebound.     Hernia: No hernia is present.  Skin:    General: Skin is warm and dry.     Capillary Refill: Capillary refill takes less than 2 seconds.  Neurological:     Mental Status: He is alert. Mental  status is at baseline.  Psychiatric:        Mood and Affect: Mood normal.        Behavior: Behavior normal.     ED Results / Procedures / Treatments   Labs (all labs ordered are listed, but only abnormal results are displayed) Labs Reviewed  COMPREHENSIVE METABOLIC PANEL - Abnormal; Notable for the following components:      Result Value   Sodium 131 (*)    Chloride 94 (*)    Glucose, Bld 326 (*)    Total Protein 9.3 (*)    All other components within normal limits  CBC WITH DIFFERENTIAL/PLATELET - Abnormal; Notable for the following components:   WBC 3.9 (*)    MCV 101.4 (*)    MCH 34.4 (*)    All other components within normal limits  LIPASE, BLOOD  URINALYSIS, ROUTINE W REFLEX MICROSCOPIC    EKG EKG Interpretation Date/Time:  Thursday July 20 2023 12:52:31 EDT Ventricular Rate:  96 PR Interval:  150 QRS Duration:  84 QT Interval:  390 QTC Calculation: 492 R Axis:   78  Text Interpretation: Normal sinus rhythm Minimal voltage criteria for LVH, may be normal variant ( Sokolow-Lyon ) Confirmed by Pricilla Loveless [phone]) on 07/20/2023 2:01:27 PM  Radiology No results found.  Procedures Procedures    Medications Ordered in ED Medications  insulin aspart (novoLOG) injection 6 Units (has no administration in time range)  alum & mag hydroxide-simeth (MAALOX/MYLANTA) 200-200-20 MG/5ML suspension 30 mL (has no administration in time range)    And  lidocaine (XYLOCAINE) 2 % viscous mouth solution 15 mL (has no administration in time range)  lactated ringers bolus 1,000 mL (1,000 mLs Intravenous New Bag/Given 07/20/23 1421)  metoCLOPramide (REGLAN) injection 10 mg (10 mg Intravenous Given 07/20/23 1417)  fentaNYL (SUBLIMAZE) injection 50 mcg (50 mcg Intravenous Given 07/20/23 1419)    ED Course/ Medical Decision Making/ A&P Clinical Course as of 07/20/23 1528  Thu Jul 20, 2023  1515 NV, daily ETOH abuse. Similar symptoms back in June. Reglan, fluids. Needing to  tolerated PO.  [CP]    Clinical Course User Index [CP] Olene Floss, PA-C                                 Medical Decision Making Amount and/or Complexity of Data Reviewed Labs: ordered.  Risk Prescription drug management.   This patient presents to the ED with chief complaint(s) of nausea and vomiting, upper abdominal pain with pertinent past medical history of pancreatitis, alcohol abuse.  The complaint involves an extensive differential diagnosis and also carries with it a high risk of complications and morbidity.    The differential diagnosis includes alcoholic  gastritis, other gastritis, gastroenteritis, cyclical vomiting syndrome  The initial plan is to obtain labs, give fluids and antiemetic  Initial Assessment:   On exam, patient appears uncomfortable, but is not in acute distress.  Abdomen is soft with mild generalized upper abdominal tenderness.  No overlying skin changes or appreciable distention.  No guarding or rebound tenderness.  Skin is warm and dry.  Vitals are stable.  Heart rate is normal in the 80s with regular rhythm.  Independent ECG/labs interpretation:  The following labs were independently interpreted:  CBC with mild leukopenia, no anemia.  Metabolic panel with hyperglycemia.  Renal and liver function are both normal.  Lipase not indicative of pancreatitis.  Treatment and Reassessment: Patient given IV fluid bolus with Reglan and fentanyl.  Upon reassessment, patient reports he is feeling much better and is ready to attempt a p.o. challenge.  Will also give a GI cocktail.  Disposition:   Suspect patient may have alcoholic gastritis due to his daily alcohol use leading up to symptoms.  He also reports that he did attend a party on Sunday with increased alcohol consumption and symptoms began on Monday.  Care handed off to the oncoming provider [address] Prosperi, PA-C.  Plan at time of handoff is reassess patient following p.o. challenge and recheck  blood sugar for improvement.  Patient will be appropriate for discharge home showed blood sugar improved and he is able to tolerate p.o.  Social Determinants of Health:   Patient's  alcohol abuse  increases the complexity of managing their presentation         Final Clinical Impression(s) / ED Diagnoses Final diagnoses:  Nausea and vomiting, unspecified vomiting type    Rx / DC Orders ED Discharge Orders     None         Lenard Simmer, PA-C 07/20/23 1529    Pricilla Loveless, MD 07/20/23 [phone]

## 2023-07-23 NOTE — Progress Notes (Unsigned)
S:     No chief complaint on file.  47 y.o. male who presents for diabetes evaluation, education, and management. Patient arrives in good spirits and presents without any assistance.  Patient was referred and last seen by Primary Care Provider, Dr. Bertram Denver, on 05/31/23. Patient has not been seen by Pharmacy for T2DM management.   PMH is significant for L BKA amputation, HTN, HLD, T2DM, pancreatitis (2011), anemia, arthritis, depression, alcohol abuse.   At last visit w/ Zelda, his A1c was 9.1 up from 8.4. Non-adherence to meds reported--no changes were made and he was referred to pharmacy for follow up. BP 140/80 in office, but reported BP at goal at home--no changes made to BP or HLD meds at this time.   To note, he was recently hospitalized on 07/20/23 for alcohol-induced n/v. He was also in the hospital on 07/09/23 for a skin infection on his amputation stump.   Today, patient reports PPBG readings 350-400, FBG 70-116. Reported 3 BG readings ~70 in the past month. Endorsed some s/sx of both hypo- and hyperglycemia. Infection on amputation stump resolved, still reports pain when walking on it for long times.   Family/Social History:  Fhx: arthritis (mother), T2DM (father)  Current diabetes medications include: Farxiga 10 mg daily, Trulicity 1.5 mg weekly (same dose for 1 year), Humulin 70/30 15 u BID Current hypertension medications include: amlodipine 10 mg, losartan 100 mg Current hyperlipidemia medications include: atorvastatin 20 mg   Patient denies adherence with medications, reports missing 3 doses of BP medications in the past week. Reported using Humulin 70/30 mix 2-3 times daily with meals.   Insurance coverage: Medicaid  Patient reports hypoglycemic events. 3 readings in 70s in past month. Symptoms of hypo not reported  Reported home fasting blood sugars: 70-116  Reported 2 hour post-meal/random blood sugars: 350-400.  Patient denies nocturia (nighttime urination).   Patient denies neuropathy (nerve pain). Patient denies visual changes. Patient denies self foot exams.  Patient reports polydipsia after big meals  Patient reported dietary habits: Eats 1-2 meals/day Breakfast: minimal Lunch: something while at work [employer]: biggest meal of day--typically a sandwich, something brought home from work ([employer]). Reported foods like grilled chicken, philly cheesesteak  Patient-reported exercise habits: minimal due to L BKA  O:   ROS  Physical Exam  Lab Results  Component Value Date   HGBA1C 9.1 (A) 05/31/2023   There were no vitals filed for this visit.  Lipid Panel     Component Value Date/Time   CHOL 193 02/28/2023 1100   TRIG 98 02/28/2023 1100   HDL 92 02/28/2023 1100   CHOLHDL 2.1 02/28/2023 1100   CHOLHDL 2.0 04/05/2011 0634   VLDL 12 04/05/2011 0634   LDLCALC 84 02/28/2023 1100    Clinical Atherosclerotic Cardiovascular Disease (ASCVD): No  The 10-year ASCVD risk score (Arnett DK, et al., 2019) is: 13.9%   Values used to calculate the score:     Age: 42 years     Sex: Male     Is Non-Hispanic African American: Yes     Diabetic: Yes     Tobacco smoker: No     Systolic Blood Pressure: 138 mmHg     Is BP treated: Yes     HDL Cholesterol: 92 mg/dL     Total Cholesterol: 193 mg/dL   Patient is participating in a Managed Medicaid Plan:  No  A/P: Diabetes longstanding currently uncontrolled. Patient is able to verbalize appropriate hypoglycemia management plan. Medication adherence appears  suboptimal--endorsed Humulin 70/30 two to three times daily. Control is suboptimal due to insulin administration & dietary indiscretion. Mixed s/sx of hypo- and hyperglycemia due to Humulin 70/30 administration. Patient was counseled extensively on risk of pancreatitis with ongoing alcohol use + Trulicity. Was informed that discontinuation of Trulicity is likely if alcohol consumption habits persist. Freestyle Libre CGM was called into pharmacy  downstairs to start PA approval--may need teaching in the future. Patient was also educated on healthy dinner alternatives & portion control.  -Discontinue Humulin 70/30 15 units BID w/ meals -Started basal insulin Lantus (insulin glargine) 30 units daily in the morning.  -Started rapid insulin Humalog (insulin lispro) 5 units before dinner. Do not give if BG <150 or if skipping the meal. -Called in [facility] 3 sensors -Continued GLP-1 Trulicity (dulaglutide) 1.5 mg weekly.  -Continued SGLT2-I Farxiga (dapagliflozin) 10 mg daily. Counseled on sick day rules. -Patient educated on purpose, proper use, and potential adverse effects of Trulicity & insulin.  -Extensively discussed pathophysiology of diabetes, recommended lifestyle interventions, dietary effects on blood sugar control.  -Counseled on s/sx of and management of hypoglycemia.  -Next A1c anticipated 08/2023.   ASCVD risk - primary prevention in patient with diabetes. Last LDL on 02/28/23 was 84, at goal of <100 mg/dL. ASCVD risk factors include HTN, HLD, DM and 10-year ASCVD risk score of 13.9%. moderate intensity statin indicated.  -Continued atorvastatin 20 mg.   Hypertension longstanding currently controlled. Blood pressure goal of <130/80 mmHg. Medication adherence suboptimal. Informed on techniques for remembering to take meds everyday.  -Continued amlodipine 10 mg daily and losartan 100 mg daily  Written patient instructions provided. Patient verbalized understanding of treatment plan.  Total time in face to face counseling 30 minutes.    Follow-up:  Pharmacist in 4 weeks  PCP clinic visit in 5 weeks Patient seen with Rosina Lowenstein, PharmD Candidate  Butch Penny, PharmD, BCACP, CPP Clinical Pharmacist [facility] [phone]

## 2023-07-24 ENCOUNTER — Encounter: Payer: Self-pay | Admitting: Pharmacist

## 2023-07-24 ENCOUNTER — Other Ambulatory Visit: Payer: Self-pay

## 2023-07-24 ENCOUNTER — Telehealth: Payer: Self-pay | Admitting: Pharmacist

## 2023-07-24 ENCOUNTER — Ambulatory Visit: Payer: Medicaid Other | Attending: Nurse Practitioner | Admitting: Pharmacist

## 2023-07-24 VITALS — BP 116/77

## 2023-07-24 DIAGNOSIS — Z7984 Long term (current) use of oral hypoglycemic drugs: Secondary | ICD-10-CM | POA: Diagnosis not present

## 2023-07-24 DIAGNOSIS — Z794 Long term (current) use of insulin: Secondary | ICD-10-CM

## 2023-07-24 DIAGNOSIS — Z7985 Long-term (current) use of injectable non-insulin antidiabetic drugs: Secondary | ICD-10-CM | POA: Diagnosis not present

## 2023-07-24 DIAGNOSIS — E1165 Type 2 diabetes mellitus with hyperglycemia: Secondary | ICD-10-CM | POA: Diagnosis not present

## 2023-07-24 MED ORDER — INSULIN LISPRO (1 UNIT DIAL) 100 UNIT/ML (KWIKPEN)
5.0000 [IU] | PEN_INJECTOR | Freq: Every day | SUBCUTANEOUS | 2 refills | Status: DC
  Filled 2023-07-24: qty 3, 28d supply, fill #0
  Filled 2023-08-25: qty 3, 28d supply, fill #1
  Filled 2023-09-22 (×2): qty 3, 28d supply, fill #2

## 2023-07-24 MED ORDER — FREESTYLE LIBRE 2 READER DEVI
0 refills | Status: AC
Start: 2023-07-24 — End: ?
  Filled 2023-07-24: qty 1, 90d supply, fill #0

## 2023-07-24 MED ORDER — FREESTYLE LIBRE 2 SENSOR MISC
6 refills | Status: DC
  Filled 2023-07-24: qty 2, 28d supply, fill #0
  Filled 2023-08-25: qty 2, 28d supply, fill #1
  Filled 2023-09-22 (×2): qty 2, 28d supply, fill #2
  Filled 2023-10-23 – 2023-10-24 (×2): qty 2, 28d supply, fill #3
  Filled 2023-11-14 (×2): qty 2, 28d supply, fill #4
  Filled 2023-12-25: qty 2, 28d supply, fill #5
  Filled 2024-01-22 – 2024-01-23 (×2): qty 2, 28d supply, fill #6

## 2023-07-24 MED ORDER — LANTUS SOLOSTAR 100 UNIT/ML ~~LOC~~ SOPN
30.0000 [IU] | PEN_INJECTOR | Freq: Every day | SUBCUTANEOUS | 2 refills | Status: DC
  Filled 2023-07-24: qty 9, 30d supply, fill #0
  Filled 2023-08-25: qty 9, 30d supply, fill #1
  Filled 2023-09-22 (×2): qty 9, 30d supply, fill #2

## 2023-07-24 NOTE — Telephone Encounter (Signed)
Can we try to get PA approval for his CGM? He is on basal + bolus insulin.

## 2023-07-25 ENCOUNTER — Other Ambulatory Visit: Payer: Self-pay

## 2023-07-26 ENCOUNTER — Encounter: Payer: Self-pay | Admitting: Pharmacist

## 2023-07-26 ENCOUNTER — Other Ambulatory Visit: Payer: Self-pay

## 2023-07-26 ENCOUNTER — Telehealth: Payer: Self-pay | Admitting: Gastroenterology

## 2023-07-26 NOTE — Telephone Encounter (Signed)
Inbound call from patient stating he needed to reschedule his canceled colon. Patient was rescheduled for 10/9 at 3:30 and needs new prep instructions and a letter for his job stating the day and time he is having his procedure. Please advise.

## 2023-07-27 NOTE — Telephone Encounter (Signed)
Patient aware that new instructions for colonoscopy scheduled on 09-06-23 has been sent to him in MyChart for his review.  Patient requested a copy of instructions be mailed to his home.   Instructions printed and put in the box to be mailed.  Patient agreed to plan and verbalized understanding.  No further questions.

## 2023-08-01 ENCOUNTER — Encounter: Payer: Self-pay | Admitting: Family

## 2023-08-01 ENCOUNTER — Other Ambulatory Visit: Payer: Self-pay

## 2023-08-01 ENCOUNTER — Other Ambulatory Visit: Payer: Self-pay | Admitting: Nurse Practitioner

## 2023-08-01 ENCOUNTER — Other Ambulatory Visit (HOSPITAL_COMMUNITY): Payer: Self-pay

## 2023-08-01 ENCOUNTER — Ambulatory Visit (INDEPENDENT_AMBULATORY_CARE_PROVIDER_SITE_OTHER): Payer: Medicaid Other | Admitting: Family

## 2023-08-01 DIAGNOSIS — Z89512 Acquired absence of left leg below knee: Secondary | ICD-10-CM

## 2023-08-01 DIAGNOSIS — E1165 Type 2 diabetes mellitus with hyperglycemia: Secondary | ICD-10-CM

## 2023-08-01 MED ORDER — TRULICITY 1.5 MG/0.5ML ~~LOC~~ SOAJ
1.5000 mg | SUBCUTANEOUS | 0 refills | Status: DC
  Filled 2023-08-01: qty 2, 28d supply, fill #0
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
## 2023-08-08 ENCOUNTER — Encounter (HOSPITAL_BASED_OUTPATIENT_CLINIC_OR_DEPARTMENT_OTHER): Payer: Medicaid Other | Admitting: Internal Medicine

## 2023-08-08 ENCOUNTER — Other Ambulatory Visit: Payer: Self-pay

## 2023-08-25 ENCOUNTER — Other Ambulatory Visit: Payer: Self-pay

## 2023-08-25 ENCOUNTER — Other Ambulatory Visit (HOSPITAL_COMMUNITY): Payer: Self-pay

## 2023-08-31 ENCOUNTER — Ambulatory Visit: Payer: Medicaid Other | Admitting: Pharmacist

## 2023-09-01 ENCOUNTER — Ambulatory Visit: Payer: Self-pay | Admitting: Nurse Practitioner

## 2023-09-05 ENCOUNTER — Ambulatory Visit: Payer: Medicaid Other | Admitting: Pharmacist

## 2023-09-05 ENCOUNTER — Telehealth: Payer: Self-pay | Admitting: Gastroenterology

## 2023-09-05 NOTE — Telephone Encounter (Signed)
Ok, is a late cancellation as per [employer]

## 2023-09-05 NOTE — Telephone Encounter (Signed)
Good Morning Dr. Lavon Paganini,   Patients significant  other called stating that patient wanted to cancel his procedure with you tomorrow 10/9 at 3:30 until further notice. Patient is wanting to speak with PCP before procedure. Please advise.

## 2023-09-06 ENCOUNTER — Emergency Department (HOSPITAL_COMMUNITY)
Admission: EM | Admit: 2023-09-06 | Discharge: 2023-09-07 | Discharge status: Against Medical Advice | Payer: Medicaid Other | Attending: Emergency Medicine | Admitting: Emergency Medicine

## 2023-09-06 ENCOUNTER — Encounter: Payer: Medicaid Other | Admitting: Gastroenterology

## 2023-09-06 ENCOUNTER — Encounter (HOSPITAL_COMMUNITY): Payer: Self-pay | Admitting: Emergency Medicine

## 2023-09-06 ENCOUNTER — Other Ambulatory Visit: Payer: Self-pay

## 2023-09-06 DIAGNOSIS — R197 Diarrhea, unspecified: Secondary | ICD-10-CM | POA: Insufficient documentation

## 2023-09-06 DIAGNOSIS — R630 Anorexia: Secondary | ICD-10-CM | POA: Insufficient documentation

## 2023-09-06 DIAGNOSIS — Z5321 Procedure and treatment not carried out due to patient leaving prior to being seen by health care provider: Secondary | ICD-10-CM | POA: Diagnosis not present

## 2023-09-06 DIAGNOSIS — R1013 Epigastric pain: Secondary | ICD-10-CM | POA: Insufficient documentation

## 2023-09-06 DIAGNOSIS — R112 Nausea with vomiting, unspecified: Secondary | ICD-10-CM | POA: Diagnosis not present

## 2023-09-06 LAB — URINALYSIS, ROUTINE W REFLEX MICROSCOPIC
Bilirubin Urine: NEGATIVE
Glucose, UA: 50 mg/dL — AB
Ketones, ur: 20 mg/dL — AB
Leukocytes,Ua: NEGATIVE
Nitrite: NEGATIVE
Protein, ur: 100 mg/dL — AB
Specific Gravity, Urine: 1.013 (ref 1.005–1.030)
pH: 5 (ref 5.0–8.0)

## 2023-09-06 LAB — CBG MONITORING, ED: Glucose-Capillary: 122 mg/dL — ABNORMAL HIGH (ref 70–99)

## 2023-09-06 LAB — COMPREHENSIVE METABOLIC PANEL
ALT: 38 U/L (ref 0–44)
AST: 59 U/L — ABNORMAL HIGH (ref 15–41)
Albumin: 3.5 g/dL (ref 3.5–5.0)
Alkaline Phosphatase: 113 U/L (ref 38–126)
Anion gap: 11 (ref 5–15)
BUN: 5 mg/dL — ABNORMAL LOW (ref 6–20)
CO2: 23 mmol/L (ref 22–32)
Calcium: 9.4 mg/dL (ref 8.9–10.3)
Chloride: 100 mmol/L (ref 98–111)
Creatinine, Ser: 0.94 mg/dL (ref 0.61–1.24)
GFR, Estimated: >60 mL/min (ref >60)
Glucose, Bld: 128 mg/dL — ABNORMAL HIGH (ref 70–99)
Potassium: 3.9 mmol/L (ref 3.5–5.1)
Sodium: 134 mmol/L — ABNORMAL LOW (ref 135–145)
Total Bilirubin: 1.1 mg/dL (ref 0.3–1.2)
Total Protein: 8.6 g/dL — ABNORMAL HIGH (ref 6.5–8.1)

## 2023-09-06 LAB — CBC
HCT: 38.6 % — ABNORMAL LOW (ref 39.0–52.0)
Hemoglobin: 13.1 g/dL (ref 13.0–17.0)
MCH: 35.1 pg — ABNORMAL HIGH (ref 26.0–34.0)
MCHC: 33.9 g/dL (ref 30.0–36.0)
MCV: 103.5 fL — ABNORMAL HIGH (ref 80.0–100.0)
Platelets: 182 10*3/uL (ref 150–400)
RBC: 3.73 MIL/uL — ABNORMAL LOW (ref 4.22–5.81)
RDW: 13 % (ref 11.5–15.5)
WBC: 3 10*3/uL — ABNORMAL LOW (ref 4.0–10.5)
nRBC: 0 % (ref 0.0–0.2)

## 2023-09-06 LAB — LIPASE, BLOOD: Lipase: 17 U/L (ref 11–51)

## 2023-09-06 MED ORDER — ONDANSETRON 4 MG PO TBDP
4.0000 mg | ORAL_TABLET | Freq: Once | ORAL | Status: AC | PRN
  Administered 2023-09-06: 4 mg via ORAL
  Filled 2023-09-06: qty 1

## 2023-09-06 NOTE — ED Triage Notes (Signed)
Pt reports epigastric pain with N/V starting today. Pt endorses some diarrhea this AM. Pt reports loss of appetite last night. Pt with hx pancreatitis.

## 2023-09-07 NOTE — ED Notes (Signed)
Tech called pt for vital signs, no response

## 2023-09-22 ENCOUNTER — Other Ambulatory Visit: Payer: Self-pay

## 2023-09-22 ENCOUNTER — Other Ambulatory Visit (HOSPITAL_COMMUNITY): Payer: Self-pay

## 2023-09-25 ENCOUNTER — Other Ambulatory Visit: Payer: Self-pay

## 2023-09-28 ENCOUNTER — Other Ambulatory Visit (HOSPITAL_COMMUNITY): Payer: Self-pay

## 2023-10-03 ENCOUNTER — Other Ambulatory Visit (HOSPITAL_COMMUNITY): Payer: Self-pay

## 2023-10-17 ENCOUNTER — Ambulatory Visit: Payer: Medicaid Other | Admitting: Nurse Practitioner

## 2023-10-23 ENCOUNTER — Other Ambulatory Visit: Payer: Self-pay | Admitting: Family Medicine

## 2023-10-23 ENCOUNTER — Other Ambulatory Visit (HOSPITAL_COMMUNITY): Payer: Self-pay

## 2023-10-23 DIAGNOSIS — E1165 Type 2 diabetes mellitus with hyperglycemia: Secondary | ICD-10-CM

## 2023-10-23 MED ORDER — LANTUS SOLOSTAR 100 UNIT/ML ~~LOC~~ SOPN
30.0000 [IU] | PEN_INJECTOR | Freq: Every day | SUBCUTANEOUS | 1 refills | Status: DC
  Filled 2023-10-23 – 2023-10-24 (×2): qty 9, 30d supply, fill #0
  Filled 2023-11-27: qty 9, 30d supply, fill #1

## 2023-10-23 MED ORDER — INSULIN LISPRO (1 UNIT DIAL) 100 UNIT/ML (KWIKPEN)
5.0000 [IU] | PEN_INJECTOR | Freq: Every day | SUBCUTANEOUS | 1 refills | Status: DC
  Filled 2023-10-23: qty 3, 60d supply, fill #0
  Filled 2023-10-24: qty 3, 28d supply, fill #0
  Filled 2023-11-14 (×2): qty 3, 28d supply, fill #1

## 2023-10-24 ENCOUNTER — Other Ambulatory Visit (HOSPITAL_COMMUNITY): Payer: Self-pay

## 2023-10-24 ENCOUNTER — Other Ambulatory Visit: Payer: Self-pay

## 2023-11-14 ENCOUNTER — Other Ambulatory Visit (HOSPITAL_COMMUNITY): Payer: Self-pay

## 2023-11-14 ENCOUNTER — Other Ambulatory Visit: Payer: Self-pay

## 2023-11-15 ENCOUNTER — Other Ambulatory Visit: Payer: Self-pay

## 2023-11-24 ENCOUNTER — Emergency Department (HOSPITAL_COMMUNITY)
Admission: EM | Admit: 2023-11-24 | Discharge: 2023-11-24 | Disposition: A | Discharge status: Home / Self Care | Payer: Medicaid Other | Attending: Emergency Medicine | Admitting: Emergency Medicine

## 2023-11-24 ENCOUNTER — Other Ambulatory Visit: Payer: Self-pay

## 2023-11-24 ENCOUNTER — Encounter (HOSPITAL_COMMUNITY): Payer: Self-pay

## 2023-11-24 DIAGNOSIS — E11649 Type 2 diabetes mellitus with hypoglycemia without coma: Secondary | ICD-10-CM | POA: Insufficient documentation

## 2023-11-24 DIAGNOSIS — Z794 Long term (current) use of insulin: Secondary | ICD-10-CM | POA: Diagnosis not present

## 2023-11-24 DIAGNOSIS — E162 Hypoglycemia, unspecified: Secondary | ICD-10-CM | POA: Diagnosis present

## 2023-11-24 DIAGNOSIS — T887XXA Unspecified adverse effect of drug or medicament, initial encounter: Secondary | ICD-10-CM | POA: Diagnosis not present

## 2023-11-24 DIAGNOSIS — E161 Other hypoglycemia: Secondary | ICD-10-CM | POA: Diagnosis not present

## 2023-11-24 DIAGNOSIS — T50904A Poisoning by unspecified drugs, medicaments and biological substances, undetermined, initial encounter: Secondary | ICD-10-CM | POA: Diagnosis not present

## 2023-11-24 DIAGNOSIS — R61 Generalized hyperhidrosis: Secondary | ICD-10-CM | POA: Diagnosis not present

## 2023-11-24 DIAGNOSIS — I10 Essential (primary) hypertension: Secondary | ICD-10-CM | POA: Diagnosis not present

## 2023-11-24 LAB — BASIC METABOLIC PANEL
Anion gap: 12 (ref 5–15)
BUN: <5 mg/dL — ABNORMAL LOW (ref 6–20)
CO2: 18 mmol/L — ABNORMAL LOW (ref 22–32)
Calcium: 9.3 mg/dL (ref 8.9–10.3)
Chloride: 107 mmol/L (ref 98–111)
Creatinine, Ser: 0.78 mg/dL (ref 0.61–1.24)
GFR, Estimated: >60 mL/min (ref >60)
Glucose, Bld: 75 mg/dL (ref 70–99)
Potassium: 4.1 mmol/L (ref 3.5–5.1)
Sodium: 137 mmol/L (ref 135–145)

## 2023-11-24 LAB — CBG MONITORING, ED
Glucose-Capillary: 99 mg/dL (ref 70–99)
Glucose-Capillary: 100 mg/dL — ABNORMAL HIGH (ref 70–99)
Glucose-Capillary: 89 mg/dL (ref 70–99)

## 2023-11-24 NOTE — ED Notes (Signed)
RN sent down repeat lab work. Lab states they lost lab tubes first time.

## 2023-11-24 NOTE — ED Provider Notes (Signed)
[facility] EMERGENCY DEPARTMENT AT [facility] Provider Note   CSN: [id number] Arrival date & time: 11/24/23  1554     History {Add pertinent medical, surgical, social history, OB history to HPI:1} Chief Complaint  Patient presents with   Hypoglycemia    [name] is a 47 y.o. male.  47 year old male with a history of alcohol use as well as insulin-dependent diabetes who presents emergency department for low blood sugar.  Patient reports that he was distracted talking to his wife today and accidentally took 30 units of his Humalog instead of 30 units of the Lantus that he meant to take at 1230 today.  Said that he ate some apple cobbler and then went to bed and woke up and was sweating.  Blood sugars were in the 50s and he tried eating more food but says that he was not able to get his blood sugar within normal range and called 911.  EMS gave the patient a D10 bolus.  Blood sugar was initially 62 for them and then went up to 140.  Patient says this was not attempted self-harm.       Home Medications Prior to Admission medications   Medication Sig Start Date End Date Taking? Authorizing Provider  Accu-Chek Softclix Lancets lancets Use as instructed. Check blood glucose level by fingerstick twice per day. 02/28/23   Claiborne Rigg, NP  amLODipine (NORVASC) 10 MG tablet Take 1 tablet (10 mg total) by mouth daily. 02/28/23   Claiborne Rigg, NP  atorvastatin (LIPITOR) 20 MG tablet Take 1 tablet (20 mg total) by mouth daily. 02/28/23   Claiborne Rigg, NP  cephALEXin (KEFLEX) 500 MG capsule Take 1 capsule (500 mg total) by mouth 3 (three) times daily. 07/09/23   Raspet, Noberto Retort, PA-C  Continuous Glucose Receiver (FREESTYLE LIBRE 2 READER) DEVI Use to check blood sugar continuously throughout the day. 07/24/23   Hoy Register, MD  Continuous Glucose Sensor (FREESTYLE LIBRE 2 SENSOR) MISC Use to check blood sugar continuously throughout the day. Change sensors once every 14 days.  07/24/23   Hoy Register, MD  dapagliflozin propanediol (FARXIGA) 10 MG TABS tablet Take 1 tablet (10 mg total) by mouth daily before breakfast. 02/28/23   Claiborne Rigg, NP  Dulaglutide (TRULICITY) 1.5 MG/0.5ML SOPN Inject 1.5 mg into the skin once a week. 08/01/23   Hoy Register, MD  glucose blood (ACCU-CHEK GUIDE) test strip Use as instructed. Check blood glucose by fingerstick twice per day. 05/31/23   Claiborne Rigg, NP  insulin glargine (LANTUS SOLOSTAR) 100 UNIT/ML Solostar Pen Inject 30 Units into the skin daily. 10/23/23   Hoy Register, MD  insulin lispro (HUMALOG KWIKPEN) 100 UNIT/ML KwikPen Inject 5 Units into the skin daily before supper. Do not take if you don't eat or if your pre-meal blood sugar is less than 150. 10/23/23   Newlin, Enobong, MD  Insulin Pen Needle (B-D UF III MINI PEN NEEDLES) 31G X 5 MM MISC Use as instructed. Inject into the skin 5 times daily 02/28/23   Claiborne Rigg, NP  loperamide (IMODIUM) 2 MG capsule Take 1 capsule (2 mg total) by mouth 4 (four) times daily as needed for diarrhea or loose stools. 05/26/23   Gilda Crease, MD  losartan (COZAAR) 100 MG tablet Take 1 tablet (100 mg total) by mouth daily. 02/28/23   Claiborne Rigg, NP  Na Sulfate-K Sulfate-Mg Sulf (SUPREP BOWEL PREP KIT) 17.5-3.13-1.6 GM/177ML SOLN Take 1 kit by  mouth as directed. 06/19/23   Esterwood, Amy S, PA-C  ondansetron (ZOFRAN) 4 MG tablet Take 1 tablet (4 mg total) by mouth every 6 (six) hours. 07/20/23   Prosperi, Christian H, PA-C  ondansetron (ZOFRAN-ODT) 4 MG disintegrating tablet Take 1 tablet by mouth every 4 hours as needed for nausea/vomit 05/26/23   Pollina, Canary Brim, MD      Allergies    Patient has no known allergies.    Review of Systems   Review of Systems  Physical Exam Updated Vital Signs BP (!) 155/104   Pulse 83   Temp (!) 97.3 F (36.3 C) (Oral)   Resp 16   Ht 6' (1.829 m)   Wt 96.6 kg   SpO2 100%   BMI 28.88 kg/m  Physical Exam Vitals and  nursing note reviewed.  Constitutional:      General: He is not in acute distress.    Appearance: He is well-developed.     Comments: Alert and oriented x 3  HENT:     Head: Normocephalic and atraumatic.     Right Ear: External ear normal.     Left Ear: External ear normal.     Nose: Nose normal.  Eyes:     Extraocular Movements: Extraocular movements intact.     Conjunctiva/sclera: Conjunctivae normal.     Pupils: Pupils are equal, round, and reactive to light.  Cardiovascular:     Rate and Rhythm: Normal rate and regular rhythm.  Pulmonary:     Effort: Pulmonary effort is normal. No respiratory distress.     Breath sounds: Normal breath sounds.  Musculoskeletal:     Cervical back: Normal range of motion and neck supple.     Right lower leg: No edema.     Left lower leg: No edema.  Skin:    General: Skin is warm and dry.  Neurological:     Mental Status: He is alert. Mental status is at baseline.     Comments: Cranial nerves II through XII grossly intact.  Moving all 4 extremities equally  Psychiatric:        Mood and Affect: Mood normal.        Behavior: Behavior normal.     ED Results / Procedures / Treatments   Labs (all labs ordered are listed, but only abnormal results are displayed) Labs Reviewed  BASIC METABOLIC PANEL  CBG MONITORING, ED    EKG None  Radiology No results found.  Procedures Procedures  {Document cardiac monitor, telemetry assessment procedure when appropriate:1}  Medications Ordered in ED Medications - No data to display  ED Course/ Medical Decision Making/ A&P   {   Click here for ABCD2, HEART and other calculatorsREFRESH Note before signing :1}                              Medical Decision Making Amount and/or Complexity of Data Reviewed Labs: ordered.   ***  {Document critical care time when appropriate:1} {Document review of labs and clinical decision tools ie heart score, Chads2Vasc2 etc:1}  {Document your independent  review of radiology images, and any outside records:1} {Document your discussion with family members, caretakers, and with consultants:1} {Document social determinants of health affecting pt's care:1} {Document your decision making why or why not admission, treatments were needed:1} Final Clinical Impression(s) / ED Diagnoses Final diagnoses:  None    Rx / DC Orders ED Discharge Orders     None

## 2023-11-24 NOTE — ED Notes (Signed)
Pt requested glucose check as his personal alarm had gone off stating it was 69.  FSBS showed 89.  Primary RN notified.  Pt is alert and oriented and in NAD at this time.

## 2023-11-24 NOTE — ED Notes (Signed)
Pt ambulatory to bathroom

## 2023-11-24 NOTE — ED Triage Notes (Signed)
Pt arrived via [facility] from home for hypoglycemia. Pt accidentally took humalog 30 units, instead of lantus, because he was distracted. Pt ate a lot, but couldn't get glucose past 62. Per EMS, when they arrived pt was diaphretic. EMS gave D10 25 g and that brought pt's glucose up to 140. Pt is A&Ox4.

## 2023-11-27 ENCOUNTER — Other Ambulatory Visit (HOSPITAL_COMMUNITY): Payer: Self-pay

## 2023-11-27 ENCOUNTER — Other Ambulatory Visit: Payer: Self-pay

## 2023-11-27 ENCOUNTER — Other Ambulatory Visit: Payer: Self-pay | Admitting: Family Medicine

## 2023-11-27 DIAGNOSIS — E1165 Type 2 diabetes mellitus with hyperglycemia: Secondary | ICD-10-CM

## 2023-11-27 MED ORDER — INSULIN LISPRO (1 UNIT DIAL) 100 UNIT/ML (KWIKPEN)
5.0000 [IU] | PEN_INJECTOR | Freq: Every day | SUBCUTANEOUS | 0 refills | Status: DC
  Filled 2023-11-27 – 2023-11-28 (×3): qty 3, 60d supply, fill #0

## 2023-11-28 ENCOUNTER — Other Ambulatory Visit (HOSPITAL_COMMUNITY): Payer: Self-pay

## 2023-11-28 ENCOUNTER — Other Ambulatory Visit: Payer: Self-pay

## 2023-11-28 ENCOUNTER — Telehealth: Payer: Self-pay | Admitting: Nurse Practitioner

## 2023-11-28 NOTE — Telephone Encounter (Signed)
Pt. Had reported to [facility] agent that he was out of Humalog insulin. Refill sent in 11/27/23. Pharmacy states too early for insurance to pt. Called pt. And he states "I found some in the refrigerator."

## 2023-12-01 ENCOUNTER — Other Ambulatory Visit: Payer: Self-pay

## 2023-12-07 ENCOUNTER — Encounter: Payer: Self-pay | Admitting: Physician Assistant

## 2023-12-12 ENCOUNTER — Other Ambulatory Visit: Payer: Self-pay

## 2023-12-12 ENCOUNTER — Ambulatory Visit: Payer: Medicaid Other | Admitting: Family

## 2023-12-12 ENCOUNTER — Ambulatory Visit: Payer: Medicaid Other | Attending: Nurse Practitioner | Admitting: Nurse Practitioner

## 2023-12-12 ENCOUNTER — Encounter: Payer: Self-pay | Admitting: Nurse Practitioner

## 2023-12-12 VITALS — BP 157/91 | HR 79 | Resp 20 | Ht 72.0 in | Wt 217.2 lb

## 2023-12-12 DIAGNOSIS — E1165 Type 2 diabetes mellitus with hyperglycemia: Secondary | ICD-10-CM

## 2023-12-12 DIAGNOSIS — I10 Essential (primary) hypertension: Secondary | ICD-10-CM | POA: Diagnosis not present

## 2023-12-12 DIAGNOSIS — Z89512 Acquired absence of left leg below knee: Secondary | ICD-10-CM

## 2023-12-12 DIAGNOSIS — Z794 Long term (current) use of insulin: Secondary | ICD-10-CM

## 2023-12-12 DIAGNOSIS — E7800 Pure hypercholesterolemia, unspecified: Secondary | ICD-10-CM

## 2023-12-12 DIAGNOSIS — D72819 Decreased white blood cell count, unspecified: Secondary | ICD-10-CM

## 2023-12-12 LAB — POCT GLYCOSYLATED HEMOGLOBIN (HGB A1C): Hemoglobin A1C: 8.2 % — AB (ref 4.0–5.6)

## 2023-12-12 MED ORDER — ATORVASTATIN CALCIUM 20 MG PO TABS
20.0000 mg | ORAL_TABLET | Freq: Every day | ORAL | 3 refills | Status: AC
  Filled 2023-12-12: qty 90, 90d supply, fill #0

## 2023-12-12 MED ORDER — INSULIN LISPRO (1 UNIT DIAL) 100 UNIT/ML (KWIKPEN)
5.0000 [IU] | PEN_INJECTOR | Freq: Every day | SUBCUTANEOUS | 6 refills | Status: DC
  Filled 2023-12-12: qty 3, 28d supply, fill #0
  Filled 2024-02-05: qty 3, 28d supply, fill #1
  Filled 2024-03-11 (×2): qty 3, 28d supply, fill #2
  Filled 2024-04-17: qty 3, 28d supply, fill #3

## 2023-12-12 MED ORDER — TRULICITY 1.5 MG/0.5ML ~~LOC~~ SOAJ
1.5000 mg | SUBCUTANEOUS | 0 refills | Status: DC
  Filled 2023-12-12: qty 2, 28d supply, fill #0
  Filled 2024-01-16 (×2): qty 2, 28d supply, fill #1
  Filled 2024-02-05 – 2024-02-06 (×2): qty 2, 28d supply, fill #2

## 2023-12-12 MED ORDER — LOSARTAN POTASSIUM 100 MG PO TABS
100.0000 mg | ORAL_TABLET | Freq: Every day | ORAL | 1 refills | Status: DC
  Filled 2023-12-12: qty 90, 90d supply, fill #0
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
## 2023-12-13 ENCOUNTER — Other Ambulatory Visit: Payer: Self-pay

## 2023-12-14 ENCOUNTER — Other Ambulatory Visit (HOSPITAL_COMMUNITY): Payer: Self-pay

## 2023-12-14 LAB — CBC WITH DIFFERENTIAL/PLATELET
Basophils Absolute: 0 10*3/uL (ref 0.0–0.2)
Basos: 1 %
EOS (ABSOLUTE): 0.1 10*3/uL (ref 0.0–0.4)
Eos: 2 %
Hematocrit: 36.8 % — ABNORMAL LOW (ref 37.5–51.0)
Hemoglobin: 12.8 g/dL — ABNORMAL LOW (ref 13.0–17.7)
Immature Grans (Abs): 0 10*3/uL (ref 0.0–0.1)
Immature Granulocytes: 0 %
Lymphocytes Absolute: 1.2 10*3/uL (ref 0.7–3.1)
Lymphs: 30 %
MCH: 35.7 pg — ABNORMAL HIGH (ref 26.6–33.0)
MCHC: 34.8 g/dL (ref 31.5–35.7)
MCV: 103 fL — ABNORMAL HIGH (ref 79–97)
Monocytes Absolute: 0.6 10*3/uL (ref 0.1–0.9)
Monocytes: 14 %
Neutrophils Absolute: 2.2 10*3/uL (ref 1.4–7.0)
Neutrophils: 53 %
Platelets: 259 10*3/uL (ref 150–450)
RBC: 3.59 x10E6/uL — ABNORMAL LOW (ref 4.14–5.80)
RDW: 12 % (ref 11.6–15.4)
WBC: 4.1 10*3/uL (ref 3.4–10.8)

## 2023-12-14 LAB — MICROALBUMIN / CREATININE URINE RATIO
Creatinine, Urine: 36.5 mg/dL
Microalb/Creat Ratio: 45 mg/g{creat} — ABNORMAL HIGH (ref 0–29)
Microalbumin, Urine: 16.4 ug/mL

## 2023-12-19 ENCOUNTER — Other Ambulatory Visit: Payer: Self-pay | Admitting: Nurse Practitioner

## 2023-12-19 ENCOUNTER — Encounter: Payer: Self-pay | Admitting: Nurse Practitioner

## 2023-12-19 DIAGNOSIS — D649 Anemia, unspecified: Secondary | ICD-10-CM

## 2023-12-20 ENCOUNTER — Encounter: Payer: Self-pay | Admitting: Family
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
## 2023-12-25 ENCOUNTER — Other Ambulatory Visit (HOSPITAL_COMMUNITY): Payer: Self-pay

## 2023-12-25 ENCOUNTER — Other Ambulatory Visit: Payer: Self-pay

## 2023-12-26 ENCOUNTER — Other Ambulatory Visit: Payer: Self-pay

## 2024-01-08 ENCOUNTER — Other Ambulatory Visit: Payer: Self-pay

## 2024-01-08 DIAGNOSIS — Z89512 Acquired absence of left leg below knee: Secondary | ICD-10-CM | POA: Diagnosis not present

## 2024-01-09 ENCOUNTER — Other Ambulatory Visit: Payer: Self-pay

## 2024-01-11 ENCOUNTER — Other Ambulatory Visit: Payer: Self-pay

## 2024-01-15 ENCOUNTER — Other Ambulatory Visit: Payer: Self-pay

## 2024-01-16 ENCOUNTER — Other Ambulatory Visit: Payer: Self-pay

## 2024-01-16 ENCOUNTER — Other Ambulatory Visit (HOSPITAL_COMMUNITY): Payer: Self-pay

## 2024-01-17 ENCOUNTER — Other Ambulatory Visit (HOSPITAL_COMMUNITY): Payer: Self-pay

## 2024-01-22 ENCOUNTER — Other Ambulatory Visit (HOSPITAL_COMMUNITY): Payer: Self-pay

## 2024-01-23 ENCOUNTER — Other Ambulatory Visit: Payer: Self-pay

## 2024-01-24 ENCOUNTER — Other Ambulatory Visit (HOSPITAL_COMMUNITY): Payer: Self-pay

## 2024-02-05 ENCOUNTER — Other Ambulatory Visit: Payer: Self-pay | Admitting: Family Medicine

## 2024-02-05 ENCOUNTER — Other Ambulatory Visit: Payer: Self-pay

## 2024-02-05 DIAGNOSIS — E1165 Type 2 diabetes mellitus with hyperglycemia: Secondary | ICD-10-CM

## 2024-02-05 MED ORDER — LANTUS SOLOSTAR 100 UNIT/ML ~~LOC~~ SOPN
30.0000 [IU] | PEN_INJECTOR | Freq: Every day | SUBCUTANEOUS | 1 refills | Status: DC
  Filled 2024-02-05: qty 9, 30d supply, fill #0
  Filled 2024-03-11 (×2): qty 9, 30d supply, fill #1

## 2024-02-06 ENCOUNTER — Other Ambulatory Visit (HOSPITAL_COMMUNITY): Payer: Self-pay

## 2024-02-06 ENCOUNTER — Other Ambulatory Visit: Payer: Self-pay

## 2024-02-21 ENCOUNTER — Other Ambulatory Visit (HOSPITAL_COMMUNITY): Payer: Self-pay

## 2024-02-21 ENCOUNTER — Other Ambulatory Visit: Payer: Self-pay | Admitting: Nurse Practitioner

## 2024-02-21 DIAGNOSIS — E1165 Type 2 diabetes mellitus with hyperglycemia: Secondary | ICD-10-CM

## 2024-02-21 MED ORDER — FREESTYLE LIBRE 2 SENSOR MISC
6 refills | Status: DC
  Filled 2024-02-21 – 2024-02-22 (×2): qty 2, 28d supply, fill #0
  Filled 2024-03-25: qty 2, 28d supply, fill #1
  Filled 2024-03-25: qty 2, 28d supply, fill #0
  Filled 2024-04-23: qty 2, 28d supply, fill #1

## 2024-02-22 ENCOUNTER — Other Ambulatory Visit (HOSPITAL_COMMUNITY): Payer: Self-pay

## 2024-02-22 ENCOUNTER — Other Ambulatory Visit: Payer: Self-pay

## 2024-02-29 ENCOUNTER — Other Ambulatory Visit (HOSPITAL_COMMUNITY): Payer: Self-pay

## 2024-03-08 ENCOUNTER — Other Ambulatory Visit: Payer: Self-pay

## 2024-03-11 ENCOUNTER — Other Ambulatory Visit: Payer: Self-pay

## 2024-03-11 ENCOUNTER — Other Ambulatory Visit (HOSPITAL_COMMUNITY): Payer: Self-pay

## 2024-03-11 ENCOUNTER — Other Ambulatory Visit: Payer: Self-pay | Admitting: Nurse Practitioner

## 2024-03-11 DIAGNOSIS — E1165 Type 2 diabetes mellitus with hyperglycemia: Secondary | ICD-10-CM

## 2024-03-12 ENCOUNTER — Other Ambulatory Visit (HOSPITAL_COMMUNITY): Payer: Self-pay

## 2024-03-12 ENCOUNTER — Other Ambulatory Visit: Payer: Self-pay

## 2024-03-12 MED ORDER — TRULICITY 1.5 MG/0.5ML ~~LOC~~ SOAJ
1.5000 mg | SUBCUTANEOUS | 0 refills | Status: DC
  Filled 2024-03-12: qty 6, 84d supply, fill #0

## 2024-03-25 ENCOUNTER — Other Ambulatory Visit: Payer: Self-pay

## 2024-03-25 ENCOUNTER — Other Ambulatory Visit (HOSPITAL_COMMUNITY): Payer: Self-pay

## 2024-04-05 ENCOUNTER — Encounter (HOSPITAL_COMMUNITY): Payer: Self-pay

## 2024-04-05 ENCOUNTER — Other Ambulatory Visit: Payer: Self-pay

## 2024-04-05 ENCOUNTER — Ambulatory Visit (HOSPITAL_COMMUNITY)
Admission: EM | Admit: 2024-04-05 | Discharge: 2024-04-05 | Disposition: A | Discharge status: Home / Self Care | Attending: Family Medicine | Admitting: Family Medicine

## 2024-04-05 DIAGNOSIS — R109 Unspecified abdominal pain: Secondary | ICD-10-CM

## 2024-04-05 DIAGNOSIS — R112 Nausea with vomiting, unspecified: Secondary | ICD-10-CM | POA: Diagnosis not present

## 2024-04-05 DIAGNOSIS — K529 Noninfective gastroenteritis and colitis, unspecified: Secondary | ICD-10-CM

## 2024-04-05 MED ORDER — ONDANSETRON 4 MG PO TBDP
4.0000 mg | ORAL_TABLET | Freq: Once | ORAL | Status: AC
  Administered 2024-04-05: 4 mg via ORAL

## 2024-04-05 MED ORDER — ONDANSETRON HCL 4 MG PO TABS
4.0000 mg | ORAL_TABLET | Freq: Four times a day (QID) | ORAL | 0 refills | Status: DC
  Filled 2024-04-05: qty 12, 3d supply, fill #0

## 2024-04-05 MED ORDER — ONDANSETRON 4 MG PO TBDP
ORAL_TABLET | ORAL | Status: AC
  Filled 2024-04-05: qty 1

## 2024-04-05 NOTE — ED Triage Notes (Signed)
 Yesterday morning pt started abd pain and vomiting. Pt c/o pain in the URQ. Pt has not taken anything at home.
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
## 2024-04-05 NOTE — Discharge Instructions (Signed)
 You were seen today for abdominal pain, nausea and vomiting.  I have given you zofran  today for nausea, and have sent this to your pharmacy as well.  Please continue to push fluids, and eat bland foods as tolerated.  You should follow up with your primary care provider for continued care.  I do not see an appointment in the computer so you may need to call them for an appointment.

## 2024-04-17 ENCOUNTER — Other Ambulatory Visit (HOSPITAL_COMMUNITY): Payer: Self-pay

## 2024-04-17 ENCOUNTER — Other Ambulatory Visit: Payer: Self-pay | Admitting: Nurse Practitioner

## 2024-04-17 ENCOUNTER — Other Ambulatory Visit: Payer: Self-pay

## 2024-04-17 DIAGNOSIS — E1165 Type 2 diabetes mellitus with hyperglycemia: Secondary | ICD-10-CM

## 2024-04-17 MED ORDER — LANTUS SOLOSTAR 100 UNIT/ML ~~LOC~~ SOPN
30.0000 [IU] | PEN_INJECTOR | Freq: Every day | SUBCUTANEOUS | 0 refills | Status: DC
  Filled 2024-04-17: qty 9, 30d supply, fill #0

## 2024-04-23 ENCOUNTER — Other Ambulatory Visit (HOSPITAL_COMMUNITY): Payer: Self-pay

## 2024-04-23 ENCOUNTER — Other Ambulatory Visit: Payer: Self-pay

## 2024-04-24 ENCOUNTER — Other Ambulatory Visit: Payer: Self-pay

## 2024-05-01 ENCOUNTER — Encounter: Payer: Self-pay | Admitting: Nurse Practitioner

## 2024-05-01 ENCOUNTER — Ambulatory Visit: Payer: Self-pay | Attending: Nurse Practitioner | Admitting: Nurse Practitioner

## 2024-05-01 ENCOUNTER — Other Ambulatory Visit: Payer: Self-pay

## 2024-05-01 VITALS — BP 131/81 | HR 86 | Resp 19 | Ht 72.0 in | Wt 207.6 lb

## 2024-05-01 DIAGNOSIS — I10 Essential (primary) hypertension: Secondary | ICD-10-CM | POA: Diagnosis not present

## 2024-05-01 DIAGNOSIS — E1165 Type 2 diabetes mellitus with hyperglycemia: Secondary | ICD-10-CM | POA: Diagnosis not present

## 2024-05-01 DIAGNOSIS — D649 Anemia, unspecified: Secondary | ICD-10-CM

## 2024-05-01 DIAGNOSIS — Z794 Long term (current) use of insulin: Secondary | ICD-10-CM

## 2024-05-01 LAB — POCT GLYCOSYLATED HEMOGLOBIN (HGB A1C): HbA1c, POC (controlled diabetic range): 6.9 % (ref 0.0–7.0)

## 2024-05-01 MED ORDER — TRULICITY 1.5 MG/0.5ML ~~LOC~~ SOAJ
1.5000 mg | SUBCUTANEOUS | 1 refills | Status: DC
  Filled 2024-05-01 – 2024-06-03 (×2): qty 6, 84d supply, fill #0
  Filled 2024-06-11: qty 2, 28d supply, fill #0
  Filled 2024-06-12: qty 6, 84d supply, fill #0
  Filled 2024-09-02: qty 6, 84d supply, fill #1

## 2024-05-01 MED ORDER — INSULIN LISPRO (1 UNIT DIAL) 100 UNIT/ML (KWIKPEN)
5.0000 [IU] | PEN_INJECTOR | Freq: Every day | SUBCUTANEOUS | 6 refills | Status: AC
  Filled 2024-05-01: qty 3, 60d supply, fill #0
  Filled 2024-06-03: qty 3, 28d supply, fill #0
  Filled 2024-06-03: qty 3, 60d supply, fill #0
  Filled 2024-06-28 (×2): qty 3, 28d supply, fill #1
  Filled 2024-11-15: qty 3, 28d supply, fill #2
  Filled 2024-12-25: qty 3, 28d supply, fill #3

## 2024-05-01 MED ORDER — FREESTYLE LIBRE 2 SENSOR MISC
6 refills | Status: DC
  Filled 2024-05-01: qty 2, fill #0
  Filled 2024-05-27: qty 2, 28d supply, fill #0
  Filled 2024-06-25 (×2): qty 2, 28d supply, fill #1
  Filled 2024-07-24 (×2): qty 2, 28d supply, fill #2
  Filled 2024-08-12 – 2024-08-14 (×3): qty 2, 28d supply, fill #3
  Filled 2024-09-10: qty 2, 28d supply, fill #4
  Filled 2024-10-09: qty 2, 28d supply, fill #5
  Filled 2024-11-06: qty 2, 28d supply, fill #6

## 2024-05-01 MED ORDER — LOSARTAN POTASSIUM 100 MG PO TABS
100.0000 mg | ORAL_TABLET | Freq: Every day | ORAL | 1 refills | Status: DC
  Filled 2024-05-01: qty 90, 90d supply, fill #0

## 2024-05-01 MED ORDER — LANTUS SOLOSTAR 100 UNIT/ML ~~LOC~~ SOPN
30.0000 [IU] | PEN_INJECTOR | Freq: Every day | SUBCUTANEOUS | 6 refills | Status: DC
  Filled 2024-05-01 – 2024-06-03 (×3): qty 9, 30d supply, fill #0
  Filled 2024-07-04 – 2024-07-05 (×2): qty 9, 30d supply, fill #1
  Filled 2024-08-19: qty 9, 30d supply, fill #2

## 2024-05-01 MED ORDER — DAPAGLIFLOZIN PROPANEDIOL 10 MG PO TABS
10.0000 mg | ORAL_TABLET | Freq: Every day | ORAL | 1 refills | Status: DC
  Filled 2024-05-01: qty 90, 90d supply, fill #0
  Filled 2024-05-15: qty 30, 30d supply, fill #0

## 2024-05-01 NOTE — Patient Instructions (Signed)
 [facility] Located in: [place] [facility] [address] Address: [address], [place], [place] [address] Phone: [phone]
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
## 2024-05-02 ENCOUNTER — Other Ambulatory Visit: Payer: Self-pay

## 2024-05-02 ENCOUNTER — Ambulatory Visit: Payer: Self-pay | Admitting: Nurse Practitioner

## 2024-05-02 LAB — CBC WITH DIFFERENTIAL/PLATELET
Basophils Absolute: 0 10*3/uL (ref 0.0–0.2)
Basos: 1 %
EOS (ABSOLUTE): 0.1 10*3/uL (ref 0.0–0.4)
Eos: 1 %
Hematocrit: 38.4 % (ref 37.5–51.0)
Hemoglobin: 13 g/dL (ref 13.0–17.7)
Immature Grans (Abs): 0 10*3/uL (ref 0.0–0.1)
Immature Granulocytes: 0 %
Lymphocytes Absolute: 1 10*3/uL (ref 0.7–3.1)
Lymphs: 26 %
MCH: 35.8 pg — ABNORMAL HIGH (ref 26.6–33.0)
MCHC: 33.9 g/dL (ref 31.5–35.7)
MCV: 106 fL — ABNORMAL HIGH (ref 79–97)
Monocytes Absolute: 0.5 10*3/uL (ref 0.1–0.9)
Monocytes: 12 %
Neutrophils Absolute: 2.3 10*3/uL (ref 1.4–7.0)
Neutrophils: 60 %
Platelets: 224 10*3/uL (ref 150–450)
RBC: 3.63 x10E6/uL — ABNORMAL LOW (ref 4.14–5.80)
RDW: 14 % (ref 11.6–15.4)
WBC: 3.9 10*3/uL (ref 3.4–10.8)

## 2024-05-02 LAB — CMP14+EGFR
ALT: 33 IU/L (ref 0–44)
AST: 49 IU/L — ABNORMAL HIGH (ref 0–40)
Albumin: 4.2 g/dL (ref 4.1–5.1)
Alkaline Phosphatase: 130 IU/L — ABNORMAL HIGH (ref 44–121)
BUN/Creatinine Ratio: 8 — ABNORMAL LOW (ref 9–20)
BUN: 8 mg/dL (ref 6–24)
Bilirubin Total: 0.5 mg/dL (ref 0.0–1.2)
CO2: 18 mmol/L — ABNORMAL LOW (ref 20–29)
Calcium: 9.5 mg/dL (ref 8.7–10.2)
Chloride: 102 mmol/L (ref 96–106)
Creatinine, Ser: 1.04 mg/dL (ref 0.76–1.27)
Globulin, Total: 3.7 g/dL (ref 1.5–4.5)
Glucose: 148 mg/dL — ABNORMAL HIGH (ref 70–99)
Potassium: 4.4 mmol/L (ref 3.5–5.2)
Sodium: 136 mmol/L (ref 134–144)
Total Protein: 7.9 g/dL (ref 6.0–8.5)
eGFR: 89 mL/min/{1.73_m2} (ref >59)

## 2024-05-07 ENCOUNTER — Other Ambulatory Visit: Payer: Self-pay

## 2024-05-07 ENCOUNTER — Telehealth: Payer: Self-pay | Admitting: Pharmacist

## 2024-05-07 NOTE — Telephone Encounter (Signed)
 Hey friend,   This patient's Farxiga  PA was denied. He has T2DM and Farxiga  has been part of a regimen that has seen his A1c improve from 8.2 to 6.9%. Is there anything we can do to keep him on Farxiga  or Jardiance?

## 2024-05-10 ENCOUNTER — Other Ambulatory Visit: Payer: Self-pay

## 2024-05-13 ENCOUNTER — Other Ambulatory Visit: Payer: Self-pay

## 2024-05-15 ENCOUNTER — Other Ambulatory Visit: Payer: Self-pay

## 2024-05-15 ENCOUNTER — Telehealth: Payer: Self-pay

## 2024-05-15 NOTE — Telephone Encounter (Signed)
 Pharmacy Patient Advocate Encounter  Received notification from [facility] that Prior Authorization for FARXIGA  has been APPROVED from 05/15/2024 to 05/15/2025   PA #/Case ID/Reference #: [id number]

## 2024-05-15 NOTE — Telephone Encounter (Signed)
 Additional chart notes for reprocessing of Farxiga  prior authorization has been faxed to [employer] today. Case ID: [id number] Faxed documents to [phone] ([employer] Prior Auth Dept). Waiting on insurance to reconsider their initial denial.

## 2024-05-20 ENCOUNTER — Other Ambulatory Visit: Payer: Self-pay

## 2024-05-24 ENCOUNTER — Other Ambulatory Visit: Payer: Self-pay

## 2024-05-27 ENCOUNTER — Other Ambulatory Visit (HOSPITAL_COMMUNITY): Payer: Self-pay

## 2024-05-28 ENCOUNTER — Other Ambulatory Visit: Payer: Self-pay

## 2024-06-03 ENCOUNTER — Other Ambulatory Visit: Payer: Self-pay

## 2024-06-03 ENCOUNTER — Other Ambulatory Visit (HOSPITAL_COMMUNITY): Payer: Self-pay

## 2024-06-11 ENCOUNTER — Telehealth (HOSPITAL_COMMUNITY): Payer: Self-pay

## 2024-06-11 ENCOUNTER — Other Ambulatory Visit (HOSPITAL_COMMUNITY): Payer: Self-pay

## 2024-06-11 NOTE — Telephone Encounter (Signed)
 PA request has been Received. New Encounter has been or will be created for follow up. For additional info see Pharmacy Prior Auth telephone encounter from 06/11/24.

## 2024-06-11 NOTE — Telephone Encounter (Signed)
 Pharmacy Patient Advocate Encounter   Received notification from Pt Calls Messages that prior authorization for Trulicity  1.5MG /0.5ML auto-injectors  is required/requested.   Insurance verification completed.   The patient is insured through [facility] .   Per test claim: PA required; PA submitted to above mentioned insurance via CoverMyMeds Key/confirmation #/EOC [id number] Status is pending

## 2024-06-12 ENCOUNTER — Other Ambulatory Visit: Payer: Self-pay

## 2024-06-12 ENCOUNTER — Other Ambulatory Visit (HOSPITAL_COMMUNITY): Payer: Self-pay

## 2024-06-12 NOTE — Telephone Encounter (Signed)
 Pharmacy Patient Advocate Encounter  Received notification from [facility] that Prior Authorization for Trulicity  1.5MG /0.5ML auto-injectors  has been APPROVED from 06/11/24 to 06/11/25. Ran test claim, Copay is $4. This test claim was processed through [facility]- copay amounts may vary at other pharmacies due to pharmacy/plan contracts, or as the patient moves through the different stages of their insurance plan.   PA #/Case ID/Reference #: [id number]

## 2024-06-25 ENCOUNTER — Other Ambulatory Visit (HOSPITAL_COMMUNITY): Payer: Self-pay

## 2024-06-25 ENCOUNTER — Other Ambulatory Visit: Payer: Self-pay

## 2024-06-28 ENCOUNTER — Other Ambulatory Visit (HOSPITAL_COMMUNITY): Payer: Self-pay

## 2024-06-28 ENCOUNTER — Other Ambulatory Visit: Payer: Self-pay

## 2024-07-04 ENCOUNTER — Other Ambulatory Visit (HOSPITAL_COMMUNITY): Payer: Self-pay

## 2024-07-05 ENCOUNTER — Other Ambulatory Visit (HOSPITAL_COMMUNITY): Payer: Self-pay

## 2024-07-05 ENCOUNTER — Other Ambulatory Visit: Payer: Self-pay

## 2024-07-24 ENCOUNTER — Other Ambulatory Visit: Payer: Self-pay

## 2024-07-24 ENCOUNTER — Other Ambulatory Visit (HOSPITAL_COMMUNITY): Payer: Self-pay

## 2024-07-25 ENCOUNTER — Other Ambulatory Visit: Payer: Self-pay

## 2024-08-01 ENCOUNTER — Other Ambulatory Visit: Payer: Self-pay

## 2024-08-01 ENCOUNTER — Emergency Department (HOSPITAL_COMMUNITY)
Admission: EM | Admit: 2024-08-01 | Discharge: 2024-08-01 | Discharge status: Against Medical Advice | Attending: Emergency Medicine | Admitting: Emergency Medicine

## 2024-08-01 DIAGNOSIS — S30861A Insect bite (nonvenomous) of abdominal wall, initial encounter: Secondary | ICD-10-CM | POA: Diagnosis not present

## 2024-08-01 DIAGNOSIS — W57XXXA Bitten or stung by nonvenomous insect and other nonvenomous arthropods, initial encounter: Secondary | ICD-10-CM | POA: Diagnosis not present

## 2024-08-01 DIAGNOSIS — R112 Nausea with vomiting, unspecified: Secondary | ICD-10-CM | POA: Insufficient documentation

## 2024-08-01 DIAGNOSIS — Z5321 Procedure and treatment not carried out due to patient leaving prior to being seen by health care provider: Secondary | ICD-10-CM | POA: Diagnosis not present

## 2024-08-01 DIAGNOSIS — R42 Dizziness and giddiness: Secondary | ICD-10-CM | POA: Insufficient documentation

## 2024-08-01 DIAGNOSIS — S80862A Insect bite (nonvenomous), left lower leg, initial encounter: Secondary | ICD-10-CM | POA: Diagnosis not present

## 2024-08-01 LAB — COMPREHENSIVE METABOLIC PANEL WITH GFR
ALT: 44 U/L (ref 0–44)
AST: 66 U/L — ABNORMAL HIGH (ref 15–41)
Albumin: 3.7 g/dL (ref 3.5–5.0)
Alkaline Phosphatase: 107 U/L (ref 38–126)
Anion gap: 13 (ref 5–15)
BUN: <5 mg/dL — ABNORMAL LOW (ref 6–20)
CO2: 20 mmol/L — ABNORMAL LOW (ref 22–32)
Calcium: 9.4 mg/dL (ref 8.9–10.3)
Chloride: 104 mmol/L (ref 98–111)
Creatinine, Ser: 0.8 mg/dL (ref 0.61–1.24)
GFR, Estimated: >60 mL/min (ref >60)
Glucose, Bld: 95 mg/dL (ref 70–99)
Potassium: 3.7 mmol/L (ref 3.5–5.1)
Sodium: 137 mmol/L (ref 135–145)
Total Bilirubin: 1 mg/dL (ref 0.0–1.2)
Total Protein: 8.4 g/dL — ABNORMAL HIGH (ref 6.5–8.1)

## 2024-08-01 LAB — MAGNESIUM: Magnesium: 2 mg/dL (ref 1.7–2.4)

## 2024-08-01 LAB — LIPASE, BLOOD: Lipase: 21 U/L (ref 11–51)

## 2024-08-01 NOTE — ED Provider Triage Note (Signed)
 Emergency Medicine Provider Triage Evaluation Note  [name] , a 48 y.o. male  was evaluated in triage.  Pt complains of a bite to his left flank.  This occurred yesterday.  He states today he has felt somnolent, and has had some nausea and vomiting.  He is unsure if this is because of the bite or if this is because of his history of heavy drinking.  He did not feel the bite  Review of Systems  Positive: As above Negative: As above  Physical Exam  BP (!) 140/104 (BP Location: Right Arm)   Pulse 90   Temp 98.2 F (36.8 C)   Resp 16   Ht 6' 1 (1.854 m)   Wt 102.1 kg   SpO2 100%   BMI 29.69 kg/m  Gen:   Awake, no distress   Resp:  Normal effort  MSK:   Moves extremities without difficulty  Other:    Medical Decision Making  Medically screening exam initiated at 3:35 PM.  Appropriate orders placed.  [name] was informed that the remainder of the evaluation will be completed by another provider, this initial triage assessment does not replace that evaluation, and the importance of remaining in the ED until their evaluation is complete.    Dylan Loge, PA-C 08/01/24 1536

## 2024-08-01 NOTE — ED Triage Notes (Signed)
 Pt. Stated, I have a bug bite my wife said it was a spider. First thing this morning I was dizzy, and stomach pain with vomiting. It is symptoms of spider bite or I might be dehydrated.

## 2024-08-01 NOTE — ED Notes (Signed)
 Called pt x3 for blood draw no answer.   KM

## 2024-08-01 NOTE — ED Triage Notes (Signed)
 Pt here from home possible spider /bite to his left lower back  , also c/o dizziness and n/v , no fevers

## 2024-08-01 NOTE — ED Notes (Signed)
 Unable to locate for recheck of vitals.

## 2024-08-05 ENCOUNTER — Telehealth: Payer: Self-pay | Admitting: Nurse Practitioner

## 2024-08-05 NOTE — Telephone Encounter (Signed)
Pt confirmed appt 9/9

## 2024-08-06 ENCOUNTER — Ambulatory Visit: Admitting: Nurse Practitioner

## 2024-08-12 ENCOUNTER — Other Ambulatory Visit: Payer: Self-pay

## 2024-08-13 ENCOUNTER — Other Ambulatory Visit: Payer: Self-pay

## 2024-08-14 ENCOUNTER — Other Ambulatory Visit: Payer: Self-pay

## 2024-08-19 ENCOUNTER — Other Ambulatory Visit: Payer: Self-pay

## 2024-08-20 ENCOUNTER — Other Ambulatory Visit: Payer: Self-pay

## 2024-08-27 ENCOUNTER — Other Ambulatory Visit: Payer: Self-pay

## 2024-08-27 ENCOUNTER — Encounter: Payer: Self-pay | Admitting: Nurse Practitioner

## 2024-08-27 ENCOUNTER — Ambulatory Visit: Attending: Nurse Practitioner | Admitting: Nurse Practitioner

## 2024-08-27 VITALS — BP 127/83 | HR 87 | Resp 19 | Ht 73.0 in | Wt 209.6 lb

## 2024-08-27 DIAGNOSIS — Z794 Long term (current) use of insulin: Secondary | ICD-10-CM | POA: Diagnosis not present

## 2024-08-27 DIAGNOSIS — E1165 Type 2 diabetes mellitus with hyperglycemia: Secondary | ICD-10-CM | POA: Diagnosis not present

## 2024-08-27 DIAGNOSIS — Z1211 Encounter for screening for malignant neoplasm of colon: Secondary | ICD-10-CM

## 2024-08-27 DIAGNOSIS — E7800 Pure hypercholesterolemia, unspecified: Secondary | ICD-10-CM

## 2024-08-27 MED ORDER — LANTUS SOLOSTAR 100 UNIT/ML ~~LOC~~ SOPN
20.0000 [IU] | PEN_INJECTOR | Freq: Every day | SUBCUTANEOUS | 1 refills | Status: DC
  Filled 2024-08-27 – 2024-09-30 (×2): qty 15, 75d supply, fill #0
  Filled 2024-11-26: qty 15, 75d supply, fill #1
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
## 2024-08-27 NOTE — Patient Instructions (Signed)
 [facility] Located in: [place] [facility] [address] Address: [address], [place], [place] [address] Phone: [phone]

## 2024-08-28 ENCOUNTER — Ambulatory Visit: Payer: Self-pay | Admitting: Nurse Practitioner

## 2024-08-28 LAB — HEMOGLOBIN A1C
Est. average glucose Bld gHb Est-mCnc: 171 mg/dL
Hgb A1c MFr Bld: 7.6 % — ABNORMAL HIGH (ref 4.8–5.6)

## 2024-08-28 LAB — LIPID PANEL
Chol/HDL Ratio: 2.2 ratio (ref 0.0–5.0)
Cholesterol, Total: 164 mg/dL (ref 100–199)
HDL: 75 mg/dL (ref >39)
LDL Chol Calc (NIH): 69 mg/dL (ref 0–99)
Triglycerides: 113 mg/dL (ref 0–149)
VLDL Cholesterol Cal: 20 mg/dL (ref 5–40)

## 2024-09-02 ENCOUNTER — Other Ambulatory Visit: Payer: Self-pay

## 2024-09-03 ENCOUNTER — Other Ambulatory Visit: Payer: Self-pay

## 2024-09-10 ENCOUNTER — Other Ambulatory Visit: Payer: Self-pay

## 2024-09-11 ENCOUNTER — Other Ambulatory Visit: Payer: Self-pay

## 2024-09-20 ENCOUNTER — Encounter (HOSPITAL_COMMUNITY): Payer: Self-pay | Admitting: *Deleted

## 2024-09-20 ENCOUNTER — Ambulatory Visit (HOSPITAL_COMMUNITY): Admission: EM | Admit: 2024-09-20 | Discharge: 2024-09-20 | Disposition: A | Discharge status: Home / Self Care

## 2024-09-20 ENCOUNTER — Other Ambulatory Visit: Payer: Self-pay

## 2024-09-20 DIAGNOSIS — R112 Nausea with vomiting, unspecified: Secondary | ICD-10-CM | POA: Diagnosis not present

## 2024-09-20 DIAGNOSIS — K2900 Acute gastritis without bleeding: Secondary | ICD-10-CM | POA: Diagnosis not present

## 2024-09-20 MED ORDER — ONDANSETRON 4 MG PO TBDP
4.0000 mg | ORAL_TABLET | Freq: Once | ORAL | Status: AC
  Administered 2024-09-20: 4 mg via ORAL

## 2024-09-20 MED ORDER — DICLOFENAC SODIUM 50 MG PO TBEC
50.0000 mg | DELAYED_RELEASE_TABLET | Freq: Two times a day (BID) | ORAL | 1 refills | Status: AC
  Filled 2024-09-20: qty 30, 15d supply, fill #0

## 2024-09-20 MED ORDER — ONDANSETRON 4 MG PO TBDP
ORAL_TABLET | ORAL | Status: AC
  Filled 2024-09-20: qty 1

## 2024-09-20 MED ORDER — ONDANSETRON 4 MG PO TBDP
4.0000 mg | ORAL_TABLET | Freq: Three times a day (TID) | ORAL | 0 refills | Status: DC | PRN
  Filled 2024-09-20: qty 20, 7d supply, fill #0

## 2024-09-20 NOTE — ED Triage Notes (Signed)
 PT reports for 2 day she has had ABD pain with nausea and vomiting. Pt last vomited at 0200 this morning.

## 2024-09-20 NOTE — Discharge Instructions (Signed)
  1. Nausea and vomiting, unspecified vomiting type (Primary) 2. Acute gastritis without hemorrhage, unspecified gastritis type - ondansetron  (ZOFRAN -ODT) disintegrating tablet 4 mg given in UC for acute nausea and vomiting symptoms. - ondansetron  (ZOFRAN -ODT) 4 MG disintegrating tablet; Take 1 tablet (4 mg total) by mouth every 8 (eight) hours as needed for nausea or vomiting.  Dispense: 20 tablet; Refill: 0 - diclofenac (VOLTAREN) 50 MG EC tablet; Take 1 tablet (50 mg total) by mouth 2 (two) times daily.  Dispense: 30 tablet; Refill: 1 -Continue to monitor symptoms for any change in severity if there is any escalation of current symptoms or development of new symptoms follow-up in ER for further evaluation and management.
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
## 2024-09-21 ENCOUNTER — Encounter: Payer: Self-pay | Admitting: Nurse Practitioner

## 2024-09-30 ENCOUNTER — Encounter: Payer: Self-pay | Admitting: Radiology

## 2024-09-30 ENCOUNTER — Other Ambulatory Visit: Payer: Self-pay

## 2024-10-09 ENCOUNTER — Other Ambulatory Visit: Payer: Self-pay

## 2024-11-06 ENCOUNTER — Other Ambulatory Visit: Payer: Self-pay

## 2024-11-15 ENCOUNTER — Other Ambulatory Visit: Payer: Self-pay

## 2024-11-15 ENCOUNTER — Other Ambulatory Visit (HOSPITAL_BASED_OUTPATIENT_CLINIC_OR_DEPARTMENT_OTHER): Payer: Self-pay

## 2024-11-25 ENCOUNTER — Other Ambulatory Visit: Payer: Self-pay

## 2024-11-25 ENCOUNTER — Other Ambulatory Visit: Payer: Self-pay | Admitting: Nurse Practitioner

## 2024-11-25 DIAGNOSIS — E1165 Type 2 diabetes mellitus with hyperglycemia: Secondary | ICD-10-CM

## 2024-11-25 MED ORDER — TRULICITY 1.5 MG/0.5ML ~~LOC~~ SOAJ
1.5000 mg | SUBCUTANEOUS | 0 refills | Status: DC
  Filled 2024-11-25: qty 2, 28d supply, fill #0

## 2024-11-26 ENCOUNTER — Other Ambulatory Visit: Payer: Self-pay

## 2024-11-27 ENCOUNTER — Ambulatory Visit: Admitting: Nurse Practitioner

## 2024-11-29 ENCOUNTER — Telehealth: Payer: Self-pay | Admitting: Nurse Practitioner

## 2024-11-29 NOTE — Telephone Encounter (Signed)
 Pt confirmed appt

## 2024-12-02 ENCOUNTER — Encounter: Payer: Self-pay | Admitting: Nurse Practitioner

## 2024-12-02 ENCOUNTER — Other Ambulatory Visit: Payer: Self-pay

## 2024-12-02 ENCOUNTER — Ambulatory Visit: Attending: Nurse Practitioner | Admitting: Nurse Practitioner

## 2024-12-02 VITALS — BP 134/86 | HR 84 | Ht 73.0 in | Wt 211.6 lb

## 2024-12-02 DIAGNOSIS — I10 Essential (primary) hypertension: Secondary | ICD-10-CM | POA: Diagnosis not present

## 2024-12-02 DIAGNOSIS — Z23 Encounter for immunization: Secondary | ICD-10-CM | POA: Diagnosis not present

## 2024-12-02 DIAGNOSIS — Z794 Long term (current) use of insulin: Secondary | ICD-10-CM

## 2024-12-02 DIAGNOSIS — E1165 Type 2 diabetes mellitus with hyperglycemia: Secondary | ICD-10-CM

## 2024-12-02 DIAGNOSIS — R110 Nausea: Secondary | ICD-10-CM

## 2024-12-02 DIAGNOSIS — Z1211 Encounter for screening for malignant neoplasm of colon: Secondary | ICD-10-CM

## 2024-12-02 LAB — POCT GLYCOSYLATED HEMOGLOBIN (HGB A1C): HbA1c, POC (controlled diabetic range): 8.2 % — AB (ref 0.0–7.0)

## 2024-12-02 MED ORDER — FREESTYLE LIBRE 2 SENSOR MISC
6 refills | Status: AC
  Filled 2024-12-02 – 2025-01-03 (×2): qty 2, 28d supply, fill #0
  Filled 2025-01-03: qty 2, 28d supply, fill #1

## 2024-12-02 MED ORDER — LOSARTAN POTASSIUM 100 MG PO TABS
100.0000 mg | ORAL_TABLET | Freq: Every day | ORAL | 1 refills | Status: AC
  Filled 2024-12-02: qty 90, 90d supply, fill #0

## 2024-12-02 MED ORDER — ONDANSETRON 4 MG PO TBDP
4.0000 mg | ORAL_TABLET | Freq: Three times a day (TID) | ORAL | 0 refills | Status: AC | PRN
  Filled 2024-12-02: qty 20, 7d supply, fill #0

## 2024-12-02 MED ORDER — DAPAGLIFLOZIN PROPANEDIOL 10 MG PO TABS
10.0000 mg | ORAL_TABLET | Freq: Every day | ORAL | 1 refills | Status: AC
  Filled 2024-12-02: qty 90, 90d supply, fill #0

## 2024-12-02 MED ORDER — DAPAGLIFLOZIN PROPANEDIOL 10 MG PO TABS
10.0000 mg | ORAL_TABLET | Freq: Every day | ORAL | 1 refills | Status: DC
  Filled 2024-12-02: qty 90, 90d supply, fill #0

## 2024-12-02 MED ORDER — TRULICITY 1.5 MG/0.5ML ~~LOC~~ SOAJ
1.5000 mg | SUBCUTANEOUS | 3 refills | Status: AC
  Filled 2024-12-02 – 2024-12-25 (×2): qty 2, 28d supply, fill #0

## 2024-12-02 MED ORDER — LANTUS SOLOSTAR 100 UNIT/ML ~~LOC~~ SOPN
20.0000 [IU] | PEN_INJECTOR | Freq: Every day | SUBCUTANEOUS | 1 refills | Status: AC
  Filled 2024-12-02: qty 15, 75d supply, fill #0
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
## 2024-12-02 NOTE — Patient Instructions (Addendum)
 [facility] Located in: [place] [facility] [address] Address: [address], [place], [place] [address] Phone: [phone]    USER NAME FOR MYCHART ANTONIOLEE

## 2024-12-03 ENCOUNTER — Ambulatory Visit: Payer: Self-pay | Admitting: Nurse Practitioner

## 2024-12-03 LAB — CMP14+EGFR
ALT: 41 IU/L (ref 0–44)
AST: 61 IU/L — ABNORMAL HIGH (ref 0–40)
Albumin: 4.1 g/dL (ref 4.1–5.1)
Alkaline Phosphatase: 135 IU/L — ABNORMAL HIGH (ref 47–123)
BUN/Creatinine Ratio: 9 (ref 9–20)
BUN: 9 mg/dL (ref 6–24)
Bilirubin Total: 0.5 mg/dL (ref 0.0–1.2)
CO2: 21 mmol/L (ref 20–29)
Calcium: 9.9 mg/dL (ref 8.7–10.2)
Chloride: 101 mmol/L (ref 96–106)
Creatinine, Ser: 0.95 mg/dL (ref 0.76–1.27)
Globulin, Total: 3.8 g/dL (ref 1.5–4.5)
Glucose: 234 mg/dL — ABNORMAL HIGH (ref 70–99)
Potassium: 4.6 mmol/L (ref 3.5–5.2)
Sodium: 137 mmol/L (ref 134–144)
Total Protein: 7.9 g/dL (ref 6.0–8.5)
eGFR: 98 mL/min/1.73

## 2024-12-04 ENCOUNTER — Other Ambulatory Visit: Payer: Self-pay

## 2024-12-16 ENCOUNTER — Encounter: Payer: Self-pay | Admitting: Internal Medicine

## 2024-12-25 ENCOUNTER — Other Ambulatory Visit: Payer: Self-pay

## 2025-01-03 ENCOUNTER — Other Ambulatory Visit: Payer: Self-pay

## 2025-01-03 ENCOUNTER — Other Ambulatory Visit (HOSPITAL_COMMUNITY): Payer: Self-pay

## 2025-03-10 ENCOUNTER — Ambulatory Visit: Payer: Self-pay | Admitting: Nurse Practitioner
